# Patient Record
Sex: Male | Born: 1945 | Race: White | Hispanic: No
Health system: Southern US, Academic
[De-identification: ages and names within clinical notes are randomized; demographics above are authoritative.]

## PROBLEM LIST (undated history)

## (undated) ENCOUNTER — Encounter: Attending: Hematology & Oncology | Primary: Hematology & Oncology

## (undated) ENCOUNTER — Ambulatory Visit

## (undated) ENCOUNTER — Encounter

## (undated) ENCOUNTER — Ambulatory Visit: Attending: Urology | Primary: Urology

## (undated) ENCOUNTER — Telehealth: Attending: Hematology & Oncology | Primary: Hematology & Oncology

## (undated) ENCOUNTER — Ambulatory Visit
Attending: Student in an Organized Health Care Education/Training Program | Primary: Student in an Organized Health Care Education/Training Program

## (undated) ENCOUNTER — Telehealth

## (undated) ENCOUNTER — Telehealth: Attending: Children | Primary: Children

## (undated) ENCOUNTER — Ambulatory Visit: Payer: MEDICARE

## (undated) ENCOUNTER — Encounter
Attending: Student in an Organized Health Care Education/Training Program | Primary: Student in an Organized Health Care Education/Training Program

## (undated) ENCOUNTER — Encounter: Attending: Nurse Practitioner | Primary: Nurse Practitioner

## (undated) ENCOUNTER — Encounter
Attending: Pharmacist Clinician (PhC)/ Clinical Pharmacy Specialist | Primary: Pharmacist Clinician (PhC)/ Clinical Pharmacy Specialist

## (undated) ENCOUNTER — Encounter: Attending: Diagnostic Radiology | Primary: Diagnostic Radiology

## (undated) ENCOUNTER — Telehealth
Attending: Pharmacist Clinician (PhC)/ Clinical Pharmacy Specialist | Primary: Pharmacist Clinician (PhC)/ Clinical Pharmacy Specialist

## (undated) DIAGNOSIS — I5020 Unspecified systolic (congestive) heart failure: Secondary | ICD-10-CM

## (undated) DIAGNOSIS — M1990 Unspecified osteoarthritis, unspecified site: Secondary | ICD-10-CM

## (undated) DIAGNOSIS — I5022 Chronic systolic (congestive) heart failure: Secondary | ICD-10-CM

## (undated) DIAGNOSIS — K219 Gastro-esophageal reflux disease without esophagitis: Secondary | ICD-10-CM

## (undated) DIAGNOSIS — C801 Malignant (primary) neoplasm, unspecified: Secondary | ICD-10-CM

## (undated) DIAGNOSIS — R079 Chest pain, unspecified: Secondary | ICD-10-CM

## (undated) DIAGNOSIS — I493 Ventricular premature depolarization: Secondary | ICD-10-CM

## (undated) HISTORY — DX: Unspecified osteoarthritis, unspecified site: M19.90

## (undated) HISTORY — DX: Gastro-esophageal reflux disease without esophagitis: K21.9

## (undated) HISTORY — DX: Ventricular premature depolarization: I49.3

---

## 2004-12-09 ENCOUNTER — Ambulatory Visit: Payer: Self-pay | Admitting: Unknown Physician Specialty

## 2008-02-28 ENCOUNTER — Ambulatory Visit: Payer: Self-pay | Admitting: Unknown Physician Specialty

## 2013-04-23 ENCOUNTER — Ambulatory Visit: Payer: Self-pay | Admitting: Unknown Physician Specialty

## 2013-04-25 LAB — PATHOLOGY REPORT

## 2014-02-19 DIAGNOSIS — Z87898 Personal history of other specified conditions: Secondary | ICD-10-CM | POA: Insufficient documentation

## 2017-03-13 DIAGNOSIS — Z85820 Personal history of malignant melanoma of skin: Secondary | ICD-10-CM | POA: Insufficient documentation

## 2017-03-14 ENCOUNTER — Other Ambulatory Visit: Payer: Self-pay | Admitting: Internal Medicine

## 2017-03-14 DIAGNOSIS — Z0000 Encounter for general adult medical examination without abnormal findings: Secondary | ICD-10-CM

## 2017-03-14 DIAGNOSIS — Z136 Encounter for screening for cardiovascular disorders: Secondary | ICD-10-CM

## 2017-03-27 ENCOUNTER — Ambulatory Visit
Admission: RE | Admit: 2017-03-27 | Discharge: 2017-03-27 | Disposition: A | Discharge status: Home / Self Care | Payer: Medicare Other | Source: Ambulatory Visit | Attending: Internal Medicine | Admitting: Internal Medicine

## 2017-03-27 DIAGNOSIS — Z136 Encounter for screening for cardiovascular disorders: Secondary | ICD-10-CM | POA: Insufficient documentation

## 2017-03-27 DIAGNOSIS — Z0000 Encounter for general adult medical examination without abnormal findings: Secondary | ICD-10-CM

## 2018-04-03 DIAGNOSIS — Z86010 Personal history of colonic polyps: Secondary | ICD-10-CM | POA: Insufficient documentation

## 2018-12-12 ENCOUNTER — Ambulatory Visit: Admit: 2018-12-12 | Discharge: 2018-12-13 | Payer: MEDICARE

## 2018-12-12 DIAGNOSIS — I479 Paroxysmal tachycardia, unspecified: Secondary | ICD-10-CM | POA: Insufficient documentation

## 2018-12-12 DIAGNOSIS — R55 Syncope and collapse: Secondary | ICD-10-CM | POA: Insufficient documentation

## 2018-12-12 DIAGNOSIS — I4729 Other ventricular tachycardia: Secondary | ICD-10-CM | POA: Insufficient documentation

## 2018-12-12 DIAGNOSIS — Z8679 Personal history of other diseases of the circulatory system: Secondary | ICD-10-CM | POA: Insufficient documentation

## 2018-12-13 ENCOUNTER — Encounter: Admit: 2018-12-13 | Discharge: 2018-12-14 | Payer: MEDICARE

## 2018-12-13 DIAGNOSIS — I472 Ventricular tachycardia: Principal | ICD-10-CM

## 2018-12-16 DIAGNOSIS — Z9189 Other specified personal risk factors, not elsewhere classified: Secondary | ICD-10-CM | POA: Insufficient documentation

## 2019-01-17 ENCOUNTER — Ambulatory Visit: Admit: 2019-01-17 | Discharge: 2019-01-30 | Payer: MEDICARE

## 2019-01-17 DIAGNOSIS — I472 Ventricular tachycardia: Principal | ICD-10-CM

## 2019-01-22 ENCOUNTER — Ambulatory Visit: Admit: 2019-01-22 | Discharge: 2019-01-23 | Payer: MEDICARE | Attending: Adult Health | Primary: Adult Health

## 2019-01-22 DIAGNOSIS — I493 Ventricular premature depolarization: Principal | ICD-10-CM

## 2020-03-18 DIAGNOSIS — M180 Bilateral primary osteoarthritis of first carpometacarpal joints: Secondary | ICD-10-CM | POA: Insufficient documentation

## 2020-03-18 DIAGNOSIS — R7989 Other specified abnormal findings of blood chemistry: Secondary | ICD-10-CM | POA: Insufficient documentation

## 2020-03-18 DIAGNOSIS — H903 Sensorineural hearing loss, bilateral: Secondary | ICD-10-CM | POA: Insufficient documentation

## 2021-04-15 DIAGNOSIS — Z720 Tobacco use: Secondary | ICD-10-CM | POA: Insufficient documentation

## 2021-04-26 ENCOUNTER — Ambulatory Visit: Payer: Medicare PPO | Admitting: Urology

## 2021-04-26 ENCOUNTER — Other Ambulatory Visit: Payer: Self-pay

## 2021-04-26 ENCOUNTER — Other Ambulatory Visit
Admission: RE | Admit: 2021-04-26 | Discharge: 2021-04-26 | Disposition: A | Discharge status: Home / Self Care | Payer: Medicare PPO | Attending: Urology | Admitting: Urology

## 2021-04-26 ENCOUNTER — Encounter: Payer: Self-pay | Admitting: Urology

## 2021-04-26 VITALS — BP 144/87 | HR 65 | Ht 69.0 in | Wt 190.6 lb

## 2021-04-26 DIAGNOSIS — R972 Elevated prostate specific antigen [PSA]: Secondary | ICD-10-CM

## 2021-04-26 LAB — PSA: Prostatic Specific Antigen: 45.34 ng/mL — ABNORMAL HIGH (ref 0.00–4.00)

## 2021-04-26 NOTE — Patient Instructions (Addendum)
Transrectal Prostate Biopsy Patient Education and Post Procedure Instructions    -Definition A prostate biopsy is the removal of a small amount of tissue from the prostate gland. The tissue is examined to determine whether there is cancer.  -Reasons for Procedure A prostate biopsy is usually done after an abnormal finding by: Digital rectal exam Prostate specific antigen (PSA) blood test A prostate biopsy is the only way to find out if cancer cells are present.  -Possible Complications Problems from the procedure are rare, but all procedures have some risk including: Infection Bruising or lengthy bleeding from the rectum, or in urine or semen Difficulty urinating Reactions to anesthesia Factors that may increase the risk of complications include: Smoking History of bleeding disorders or easy bruising Use of any medications, over-the-counter medications, or herbal supplements Sensitivity or allergy to latex, medications, or anesthesia.  -Prior to Procedure Talk to your doctor about your medications. Blood thinning medications including aspirin should be stopped 1 week prior to procedure. If prescribed by your cardiologist we may need approval before stopping medications. Use a Fleets enema 2 hours before the procedure. Can be purchased at your pharmacy. Antibiotics will be administered in the clinic prior to procedure.  Please make sure you eat a light meal prior to coming in for your appointment. This can help prevent lightheadedness during the procedure and upset stomach from antibiotics. Please bring someone with you to the procedure to drive you home.  -Anesthesia Transrectal biopsy: Local anesthesia--Just the area that is being operated on is numbed using an injectable anesthetic.  -Description of the Procedure Transrectal biopsy--Your doctor will insert a small ultrasound device into the rectum. This device will produce sound waves to create an image of the prostate.  These images will help guide placement of the needle. Your doctor will then insert the needle through the wall of the rectum and into the prostate gland. The procedure should take approximately 15-30 minutes.  -Will It Hurt? You may have discomfort and soreness at the biopsy site. Pain and discomfort after the procedure can be managed with medications.  -Postoperative Care When you return home after the procedure, do the following to help ensure a smooth recovery: Stay hydrated. Drink plenty of fluids for the next few days. Avoid difficult physical activity the day and evening of the procedure. Keep in mind that you may see blood in your urine, stool, or semen for several days. Resume any medications that were stopped when you are advised to do so.  After the sample is taken, it will be sent to a pathologist for examination under a microscope. This doctor will analyze the sample for cancer. You will be scheduled for an appointment to discuss results. If cancer is present, your doctor will work with you to develop a treatment plan.   -Call Your Doctor or Seek Immediate Medical Attention It is important to monitor your recovery. Alert your doctor to any problems. If any of the following occur, call your doctor or go to the emergency room: Fever 100.5 or greater within 1 week post procedure go directly to ER Call the office for: Blood in the urine more than 1 week or in semen for more than 6 weeks post-biopsy Pain that you cannot control with the medications you have been given Pain, burning, urgency, or frequency of urination Cough, shortness of breath, or chest pain- if severe go to ER Heavy rectal bleeding or bleeding that lasts more than 1 week after the biopsy If you  have any questions or concerns please contact our office at [facility]  [facility] [address] [place], [place] [address] [phone]   Transrectal Ultrasound-Guided Prostate  Biopsy A transrectal ultrasound-guided prostate biopsy is a procedure to remove samples of prostate tissue for testing. The procedure uses ultrasound images to guide the process of removing the samples. The samples are taken to a lab to be checked for prostate cancer. This procedure is usually done to evaluate the prostate gland of men who have raised (elevated) levels of prostate-specific antigen (PSA), which can be a sign of prostate cancer. Tell a health care provider about: Any allergies you have. All medicines you are taking, including vitamins, herbs, eye drops, creams, and over-the-counter medicines. Any problems you or family members have had with anesthetic medicines. Any blood disorders you have. Any surgeries you have had. Any medical conditions you have. What are the risks? Generally, this is a safe procedure. However, problems may occur, including: Prostate infection. Bleeding from the rectum. Blood in the urine. Allergic reactions to medicines. Damage to surrounding structures such as blood vessels, organs, or muscles. Difficulty passing urine. Nerve damage. This is usually temporary. Pain. What happens before the procedure? Eating and drinking restrictions Follow instructions from your health care provider about eating and drinking. In most instances, you will not need to stop eating and drinking completely before the procedure. Medicines Ask your health care provider about: Changing or stopping your regular medicines. This is especially important if you are taking diabetes medicines or blood thinners. Taking medicines such as aspirin and ibuprofen. These medicines can thin your blood. Do not take these medicines unless your health care provider tells you to take them. Taking over-the-counter medicines, vitamins, herbs, and supplements. General instructions You will be given an enema. During an enema, a liquid is injected into your rectum to clear out waste. You may have  a blood or urine sample taken. Plan to have a responsible adult take you home from the hospital or clinic. If you will be going home right after the procedure, plan to have a responsible adult care for you for the time you are told. This is important. Ask your health care provider what steps will be taken to help prevent infection. These steps may include: Washing skin with a germ-killing soap. Taking antibiotic medicine. What happens during the procedure?  You will be given one or both of the following: A medicine to help you relax (sedative). A medicine to numb the area (local anesthetic). You will be placed on your left side, and your knees may be bent. A probe with lubricated gel will be placed into your rectum, and images will be taken of your prostate and surrounding structures. Numbing medicine will be injected into your prostate. A biopsy needle will be inserted through your rectum and guided to your prostate using the ultrasound images. Prostate tissue samples will be removed, and the needle will then be removed. The biopsy samples will be sent to a lab to be tested. The procedure may vary among health care providers and hospitals. What happens after the procedure? Your blood pressure, heart rate, breathing rate, and blood oxygen level will be monitored until the medicines you were given have worn off. You may have some discomfort in the rectal area. You will be given pain medicine as needed. Do not drive for 24 hours if you received a sedative. Summary A transrectal ultrasound-guided biopsy removes samples of tissue from your prostate. This procedure is  usually done to evaluate the prostate gland of men who have raised (elevated) levels of prostate-specific antigen (PSA), which can be a sign of prostate cancer. After your procedure, you may feel some discomfort in the rectal area. Plan to have a responsible adult take you home from the hospital or clinic. This information is not  intended to replace advice given to you by your health care provider. Make sure you discuss any questions you have with your health care provider. Document Revised: 04/02/2020 Document Reviewed: 03/04/2020 Elsevier Patient Education  2022 [place].  Prostate Cancer Screening Prostate cancer screening is a test that is done to check for the presence of prostate cancer in men. The prostate gland is a walnut-sized gland that is located below the bladder and in front of the rectum in males. The function of the prostate is to add fluid to semen during ejaculation. Prostate cancer is the second most common type of cancer in men. Who should have prostate cancer screening? Screening recommendations vary based on age and other risk factors. Screening is recommended if: You are older than age 74. If you are age 40-69, talk with your health care provider about your need for screening and how often screening should be done. Because most prostate cancers are slow growing and will not cause death, screening is generally reserved in this age group for men who have a 10-15-year life expectancy. You are younger than age 50, and you have these risk factors: Being a [place] male or a male of African descent. Having a father, brother, or uncle who has been diagnosed with prostate cancer. The risk is higher if your family member's cancer occurred at an early age. Screening is not recommended if: You are younger than age 28. You are between the ages of 37 and 66 and you have no risk factors. You are 15 years of age or older. At this age, the risks that screening can cause are greater than the benefits that it may provide. If you are at high risk for prostate cancer, your health care provider may recommend that you have screenings more often or that you start screening at a younger age. How is screening for prostate cancer done? The recommended prostate cancer screening test is a blood test called the  prostate-specific antigen (PSA) test. PSA is a protein that is made in the prostate. As you age, your prostate naturally produces more PSA. Abnormally high PSA levels may be caused by: Prostate cancer. An enlarged prostate that is not caused by cancer (benign prostatic hyperplasia, BPH). This condition is very common in older men. A prostate gland infection (prostatitis). Depending on the PSA results, you may need more tests, such as: A physical exam to check the size of your prostate gland. Blood and imaging tests. A procedure to remove tissue samples from your prostate gland for testing (biopsy). What are the benefits of prostate cancer screening? Screening can help to identify cancer at an early stage, before symptoms start and when the cancer can be treated more easily. There is a small chance that screening may lower your risk of dying from prostate cancer. The chance is small because prostate cancer is a slow-growing cancer, and most men with prostate cancer die from a different cause. What are the risks of prostate cancer screening? The main risk of prostate cancer screening is diagnosing and treating prostate cancer that would never have caused any symptoms or problems. This is called overdiagnosisand overtreatment. PSA screening cannot tell you if  your PSA is high due to cancer or a different cause. A prostate biopsy is the only procedure to diagnose prostate cancer. Even the results of a biopsy may not tell you if your cancer needs to be treated. Slow-growing prostate cancer may not need any treatment other than monitoring, so diagnosing and treating it may cause unnecessary stress or other side effects. Questions to ask your health care provider When should I start prostate cancer screening? What is my risk for prostate cancer? How often do I need screening? What type of screening tests do I need? How do I get my test results? What do my results mean? Do I need treatment? Where to  find more information The American Cancer Society: www.cancer.org American Urological Association: www.auanet.org Contact a health care provider if: You have difficulty urinating. You have pain when you urinate or ejaculate. You have blood in your urine or semen. You have pain in your back or in the area of your prostate. Summary Prostate cancer is a common type of cancer in men. The prostate gland is located below the bladder and in front of the rectum. This gland adds fluid to semen during ejaculation. Prostate cancer screening may identify cancer at an early stage, when the cancer can be treated more easily. The prostate-specific antigen (PSA) test is the recommended screening test for prostate cancer. Discuss the risks and benefits of prostate cancer screening with your health care provider. If you are age 45 or older, the risks that screening can cause are greater than the benefits that it may provide. This information is not intended to replace advice given to you by your health care provider. Make sure you discuss any questions you have with your health care provider. Document Revised: 08/20/2020 Document Reviewed: 01/30/2019 Elsevier Patient Education  [place].

## 2021-04-26 NOTE — Progress Notes (Signed)
   04/26/21 9:24 AM   Earlean Polka. 02/15/1946 [id number]  CC: Elevated PSA(88)  HPI: 75 year old male presents with an elevated PSA of 49.  No prior PSA values to review.  Urinalysis checked at the same time was benign.  He reportedly had an elevated PSA of ~4 10 years ago and a prostate biopsy at that time was negative, but those records are unavailable to me.  He denies any urinary symptoms or gross hematuria.  He reports some " tingling" behind his scrotum over the last few months.  He denies a family history of prostate or breast cancer.  He is otherwise very healthy.  His wife passed away in October 06, 2017.  Physical Exam: BP (!) 144/87 (BP Location: Left Arm, Patient Position: Sitting, Cuff Size: Normal)   Pulse 65   Ht 5\' 9"  (1.753 m)   Wt 190 lb 9.6 oz (86.5 kg)   BMI 28.15 kg/m    Constitutional:  Alert and oriented, No acute distress. Cardiovascular: No clubbing, cyanosis, or edema. Respiratory: Normal respiratory effort, no increased work of breathing. GI: Abdomen is soft, nontender, nondistended, no abdominal masses DRE: 50 g, irregular and firm at left base  Laboratory Data: Reviewed, see HPI  Pertinent Imaging: None to review  Assessment & Plan:   75 year old male with elevated PSA of 49 and abnormal DRE suspicious for prostate cancer.  We reviewed the implications of an elevated PSA and the uncertainty surrounding it. In general, a man's PSA increases with age and is produced by both normal and cancerous prostate tissue. The differential diagnosis for elevated PSA includes BPH, prostate cancer, infection, recent intercourse/ejaculation, recent urethroscopic manipulation (foley placement/cystoscopy) or trauma, and prostatitis.   Management of an elevated PSA can include observation or prostate biopsy and we discussed this in detail. Our goal is to detect clinically significant prostate cancers, and manage with either active surveillance, surgery, or radiation for  localized disease. Risks of prostate biopsy include bleeding, infection (including life threatening sepsis), pain, and lower urinary symptoms. Hematuria, hematospermia, and blood in the stool are all common after biopsy and can persist up to 4 weeks.   -Repeat PSA today -Schedule prostate biopsy next week-we discussed the possible need for further staging imaging pending biopsy results  Nickolas Madrid, MD 04/26/2021  [facility] [address] [place], [place] [address] [phone]

## 2021-05-04 ENCOUNTER — Other Ambulatory Visit: Payer: Self-pay

## 2021-05-04 ENCOUNTER — Encounter: Payer: Self-pay | Admitting: Urology

## 2021-05-04 ENCOUNTER — Ambulatory Visit: Payer: Medicare PPO | Admitting: Urology

## 2021-05-04 VITALS — BP 147/87 | HR 79 | Ht 69.0 in | Wt 190.0 lb

## 2021-05-04 DIAGNOSIS — R972 Elevated prostate specific antigen [PSA]: Secondary | ICD-10-CM

## 2021-05-04 DIAGNOSIS — C61 Malignant neoplasm of prostate: Secondary | ICD-10-CM

## 2021-05-04 MED ORDER — LEVOFLOXACIN 500 MG PO TABS
500.0000 mg | ORAL_TABLET | Freq: Once | ORAL | Status: AC
Start: 2021-05-04 — End: 2021-05-04
  Administered 2021-05-04: 500 mg via ORAL

## 2021-05-04 MED ORDER — GENTAMICIN SULFATE 40 MG/ML IJ SOLN
80.0000 mg | Freq: Once | INTRAMUSCULAR | Status: AC
  Administered 2021-05-04: 80 mg via INTRAMUSCULAR

## 2021-05-04 NOTE — Addendum Note (Signed)
Addended by: Donalee Citrin on: 05/04/2021 11:54 AM   Modules accepted: Orders

## 2021-05-04 NOTE — Progress Notes (Signed)
   05/04/21  Indication: Elevated PSA, 49  Prostate Biopsy Procedure   Informed consent was obtained, and we discussed the risks of bleeding and infection/sepsis. A time out was performed to ensure correct patient identity.  Pre-Procedure: - Last PSA Level: 49 - Gentamicin and levaquin given for antibiotic prophylaxis - Transrectal Ultrasound performed revealing a 43 gm prostate - No significant hypoechoic or median lobe noted  Procedure: - Prostate block performed using 10 cc 1% lidocaine and biopsies taken from sextant areas, a total of 12 under ultrasound guidance.  Post-Procedure: - Patient tolerated the procedure well - He was counseled to seek immediate medical attention if experiences significant bleeding, fevers, or severe pain - Return in one week to discuss biopsy results  Assessment/ Plan: Will follow up in 1-2 weeks to discuss pathology  Nickolas Madrid, MD 05/04/2021

## 2021-05-04 NOTE — Patient Instructions (Signed)

## 2021-05-05 LAB — SURGICAL PATHOLOGY

## 2021-05-11 ENCOUNTER — Encounter: Payer: Self-pay | Admitting: Urology

## 2021-05-11 ENCOUNTER — Other Ambulatory Visit: Payer: Self-pay

## 2021-05-11 ENCOUNTER — Ambulatory Visit (INDEPENDENT_AMBULATORY_CARE_PROVIDER_SITE_OTHER): Payer: Medicare PPO | Admitting: Urology

## 2021-05-11 VITALS — BP 138/82 | HR 64 | Ht 69.0 in | Wt 185.0 lb

## 2021-05-11 DIAGNOSIS — C61 Malignant neoplasm of prostate: Secondary | ICD-10-CM | POA: Diagnosis not present

## 2021-05-11 NOTE — Progress Notes (Signed)
   05/11/2021 12:38 PM   Juan Huynh. 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  We had a lengthy conversation today about the patient's new diagnosis of prostate cancer.  We reviewed the risk classifications per the AUA guidelines including very low risk, low risk, intermediate risk, and high risk disease, and the need for additional staging imaging with CT and bone scan in patients with unfavorable intermediate risk and high risk disease.  I explained that his life expectancy, clinical stage, Gleason score, PSA, and other Huynh-morbidities influence treatment strategies.  We discussed the roles of active surveillance, radiation therapy, surgical therapy with robotic prostatectomy, and hormone therapy with androgen deprivation.  We discussed that patients urinary symptoms also impact treatment strategy, as patients with severe lower urinary tract symptoms may have significant worsening or even develop urinary retention after undergoing radiation.  In regards to surgery, we discussed robotic prostatectomy +/- lymphadenectomy at length.  The procedure takes 3 to 4 hours, and patient's typically discharge home on post-op day #1.  A Foley catheter is left in place for 7 to 10 days to allow for healing of the vesicourethral anastomosis.  There is a small risk of bleeding, infection, damage to surrounding structures or bowel, hernia, DVT/PE, or serious cardiac or pulmonary complications.  We discussed at length post-op side effects including erectile dysfunction, and the importance of pre-operative erectile function on long-term outcomes.  Even with  a nerve sparing approach, there is an approximately 25% rate of permanent erectile dysfunction.  We also discussed postop urinary incontinence at length.  We expect patients to have stress incontinence post-operatively that will improve over period of weeks to months.  Less than 10% of men will require a pad at 1 year after surgery.  Patients will need to avoid heavy lifting and strenuous activity for 3 to 4 weeks, but most men return to their baseline activity status by 6 weeks.  -Staging imaging with CT abdomen and pelvis and bone scan ordered, will call with results -He also desires a second opinion, and we will help facilitate this via the [place] in [place] or [facility] for his preference   I spent 30 total minutes on the day of the encounter including pre-visit review of the medical record, face-to-face time with the patient, and post visit ordering of labs/imaging/tests.   Juan Huynh, [facility] [address], [place], [place] [address] [phone]

## 2021-05-11 NOTE — Patient Instructions (Signed)
Prostate Cancer The prostate is a small gland that produces fluid that makes up semen (seminal fluid). It is located below the bladder in men, in front of the rectum. Prostate cancer is the abnormal growth of cells in the prostate gland. What are the causes? The exact cause of this condition is not known. What increases the risk? You are more likely to develop this condition if: You are 74 years of age or older. You have a family history of prostate cancer. You have a family history of breast and ovarian cancer. You have genes that are passed from parent to child (inherited), such as BRCA1 and BRCA2. You have Lynch syndrome. African American men and men of African descent are diagnosed with prostate cancer at higher rates than other men. The reasons for this are not well understood and are likely due to a combination of genetic and environmental factors. What are the signs or symptoms? Symptoms of this condition include: Problems with urination. This may include: A weak or interrupted flow of urine. Trouble starting or stopping urination. Trouble emptying the bladder all the way. The need to urinate more often, especially at night. Blood in urine or semen. Persistent pain or discomfort in the lower back, lower abdomen, or hips. Trouble getting an erection. Weakness or numbness in the legs or feet. How is this diagnosed? This condition can be diagnosed with: A digital rectal exam. For this exam, a health care provider inserts a gloved finger into the rectum to feel the prostate gland. A blood test called a prostate-specific antigen (PSA) test. A procedure in which a sample of tissue is taken from the prostate and checked under a microscope (prostate biopsy). An imaging test called transrectal ultrasonography. Once the condition is diagnosed, tests will be done to determine how far the cancer has spread. This is called staging the cancer. Staging may involve imaging tests, such as a bone  scan, CT scan, PET scan, or MRI. Stages of prostate cancer The stages of prostate cancer are as follows: Stage 1 (I). At this stage, the cancer is found in the prostate only. The cancer is not visible on imaging tests, and it is usually found by accident, such as during prostate surgery. Stage 2 (II). At this stage, the cancer is more advanced than it is in stage 1, but the cancer has not spread outside the prostate. Stage 3 (III). At this stage, the cancer has spread beyond the outer layer of the prostate to nearby tissues. The cancer may be found in the seminal vesicles, which are near the bladder and the prostate. Stage 4 (IV). At this stage, the cancer has spread to other parts of the body, such as the lymph nodes, bones, bladder, rectum, liver, or lungs. Prostate cancer grading Prostate cancer is also graded according to how the cancer cells look under a microscope. This is called the Gleason score and the total score can range from 6-10, indicating how likely it is that the cancer will spread (metastasize) to other parts of the body. The higher the score, the greater the likelihood that the cancer will spread. Gleason 6 or lower: This indicates that the cancer cells look similar to normal prostate cells (well differentiated). Gleason 7: This indicates that the cancer cells look somewhat similar to normal prostate cells (moderately differentiated). Gleason 8, 9, or 10: This indicates that the cancer cells look very different than normal prostate cells (poorly differentiated). How is this treated? Treatment for this condition depends on several factors,  including the stage of the cancer, your age, personal preferences, and your overall health. Talk with your health care provider about treatment options that are recommended for you. Common treatments include: Observation for early stage prostate cancer (active surveillance). This involves having exams, blood tests, and in some cases, more biopsies.  For some men, this is the only treatment needed. Surgery. Types of surgeries include: Open surgery (radical prostatectomy). In this surgery, a larger incision is made to remove the prostate. A laparoscopic radical prostatectomy. This is a surgery to remove the prostate and lymph nodes through several small incisions. It is often referred to as a minimally invasive surgery. A robotic radical prostatectomy. This is laparoscopic surgery to remove the prostate and lymph nodes with the help of robotic arms that are controlled by the surgeon. Cryoablation. This is surgery to freeze and destroy cancer cells. Radiation treatment. Types of radiation treatment include: External beam radiation. This type aims beams of radiation from outside the body at the prostate to destroy cancerous cells. Brachytherapy. This type uses radioactive needles, seeds, wires, or tubes that are implanted into the prostate gland. Like external beam radiation, brachytherapy destroys cancerous cells. An advantage is that this type of radiation limits the damage to surrounding tissue and has fewer side effects. Chemotherapy. This treatment kills cancer cells or stops them from multiplying. It kills both cancer cells and normal cells. Targeted therapy. This treatment uses medicines to kill cancer cells without damaging normal cells. Hormone treatment. This treatment involves taking medicines that act on testosterone, one of the male hormones, by: Stopping your body from producing testosterone. Blocking testosterone from reaching cancer cells. Follow these instructions at home: Lifestyle Do not use any products that contain nicotine or tobacco. These products include cigarettes, chewing tobacco, and vaping devices, such as e-cigarettes. If you need help quitting, ask your health care provider. Eat a healthy diet. To do this: Eat foods that are high in fiber. These include beans, whole grains, and fresh fruits and vegetables. Limit  foods that are high in fat and sugar. These include fried or sweet foods. Treatment for prostate cancer may affect sexual function. If you have a partner, continue to have intimate moments. This may include touching, holding, hugging, and caressing your partner. Get plenty of sleep. Consider joining a support group for men who have prostate cancer. Meeting with a support group may help you learn to manage the stress of having cancer. General instructions Take over-the-counter and prescription medicines only as told by your health care provider. If you have to go to the hospital, notify your cancer specialist (oncologist). Keep all follow-up visits. This is important. Where to find more information American Cancer Society: www.cancer.org American Society of Clinical Oncology: www.cancer.net National Cancer Institute: www.cancer.gov Contact a health care provider if: You have new or increasing trouble urinating. You have new or increasing blood in your urine. You have new or increasing pain in your hips, back, or chest. Get help right away if: You have weakness or numbness in your legs. You cannot control urination or your bowel movements (incontinence). You have chills or a fever. Summary The prostate is a small gland that is involved in the production of semen. It is located below a man's bladder, in front of the rectum. Prostate cancer is the abnormal growth of cells in the prostate gland. Treatment for this condition depends on the stage of the cancer, your age, personal preferences, and your overall health. Talk with your health care provider about treatment   options that are recommended for you. Consider joining a support group for men who have prostate cancer. Meeting with a support group may help you learn to manage the stress of having cancer. This information is not intended to replace advice given to you by your health care provider. Make sure you discuss any questions you have with  your health care provider. Document Revised: 09/15/2020 Document Reviewed: 09/15/2020 Elsevier Patient Education  2022 Elsevier Inc.  

## 2021-05-19 ENCOUNTER — Other Ambulatory Visit: Payer: Self-pay

## 2021-05-19 ENCOUNTER — Encounter
Admission: RE | Admit: 2021-05-19 | Discharge: 2021-05-19 | Disposition: A | Discharge status: Home / Self Care | Payer: Medicare PPO | Source: Ambulatory Visit | Attending: Urology | Admitting: Urology

## 2021-05-19 ENCOUNTER — Ambulatory Visit
Admission: RE | Admit: 2021-05-19 | Discharge: 2021-05-19 | Disposition: A | Discharge status: Home / Self Care | Payer: Medicare PPO | Source: Ambulatory Visit | Attending: Urology | Admitting: Urology

## 2021-05-19 DIAGNOSIS — C61 Malignant neoplasm of prostate: Secondary | ICD-10-CM | POA: Insufficient documentation

## 2021-05-19 LAB — POCT I-STAT CREATININE: Creatinine, Ser: 1.2 mg/dL (ref 0.61–1.24)

## 2021-05-19 MED ORDER — IOHEXOL 300 MG/ML  SOLN
100.0000 mL | Freq: Once | INTRAMUSCULAR | Status: AC | PRN
  Administered 2021-05-19: 12:00:00 100 mL via INTRAVENOUS

## 2021-05-19 MED ORDER — TECHNETIUM TC 99M MEDRONATE IV KIT
20.0000 | PACK | Freq: Once | INTRAVENOUS | Status: AC | PRN
  Administered 2021-05-19: 12:00:00 24 via INTRAVENOUS

## 2021-05-20 ENCOUNTER — Telehealth: Payer: Self-pay | Admitting: Urology

## 2021-05-20 DIAGNOSIS — C61 Malignant neoplasm of prostate: Secondary | ICD-10-CM

## 2021-05-20 NOTE — Telephone Encounter (Signed)
Urology telephone note  75 year old healthy male with elevated PSA of 49 who underwent a prostate biopsy on 05/04/2021 showing high risk prostate cancer including Gleason score 4+5= 9 with 100% core involvement.  Staging imaging with CT and bone scan showed a enlarged left pelvic node, periaortic node consistent with metastatic disease, as well as a scapular lesion on bone scan that may represent prior injury versus metastatic disease.  We discussed his new diagnosis of metastatic prostate cancer and need for referral to oncology.  We discussed different treatment options at length, and that ADT is usually the mainstay of metastatic prostate cancer.  Referral placed to oncology, he is interested in a second oncology opinion from [place] as well   Nickolas Madrid, MD 05/20/2021

## 2021-05-23 ENCOUNTER — Ambulatory Visit: Admit: 2021-05-23 | Discharge: 2021-05-24 | Payer: MEDICARE

## 2021-05-23 DIAGNOSIS — C61 Malignant neoplasm of prostate: Principal | ICD-10-CM

## 2021-05-25 NOTE — Telephone Encounter (Signed)
Patient sent a My Chart message asking to change his referral to [facility] instead. He has an appt there on 05/30/21 and 05/31/21.  Sharyn Lull

## 2021-05-30 ENCOUNTER — Ambulatory Visit
Admit: 2021-05-30 | Discharge: 2021-05-30 | Payer: MEDICARE | Attending: Student in an Organized Health Care Education/Training Program | Primary: Student in an Organized Health Care Education/Training Program

## 2021-05-30 ENCOUNTER — Ambulatory Visit: Admit: 2021-05-30 | Discharge: 2021-05-30 | Payer: MEDICARE

## 2021-05-30 DIAGNOSIS — C778 Secondary and unspecified malignant neoplasm of lymph nodes of multiple regions: Secondary | ICD-10-CM | POA: Insufficient documentation

## 2021-05-30 DIAGNOSIS — C61 Malignant neoplasm of prostate: Principal | ICD-10-CM

## 2021-05-31 ENCOUNTER — Encounter: Admit: 2021-05-31 | Discharge: 2021-05-31 | Payer: MEDICARE

## 2021-05-31 ENCOUNTER — Ambulatory Visit
Admit: 2021-05-31 | Discharge: 2021-06-01 | Payer: MEDICARE | Attending: Hematology & Oncology | Primary: Hematology & Oncology

## 2021-05-31 DIAGNOSIS — C61 Malignant neoplasm of prostate: Principal | ICD-10-CM

## 2021-05-31 DIAGNOSIS — C778 Secondary and unspecified malignant neoplasm of lymph nodes of multiple regions: Principal | ICD-10-CM

## 2021-06-01 DIAGNOSIS — Z8262 Family history of osteoporosis: Secondary | ICD-10-CM | POA: Insufficient documentation

## 2021-06-02 ENCOUNTER — Ambulatory Visit: Admit: 2021-06-02 | Payer: MEDICARE

## 2021-06-06 DIAGNOSIS — C61 Malignant neoplasm of prostate: Principal | ICD-10-CM

## 2021-06-07 DIAGNOSIS — C61 Malignant neoplasm of prostate: Principal | ICD-10-CM

## 2021-06-13 ENCOUNTER — Ambulatory Visit: Admit: 2021-06-13 | Discharge: 2021-06-14 | Payer: MEDICARE

## 2021-06-15 DIAGNOSIS — C61 Malignant neoplasm of prostate: Principal | ICD-10-CM

## 2021-06-16 ENCOUNTER — Ambulatory Visit
Admit: 2021-06-16 | Discharge: 2021-06-17 | Payer: MEDICARE | Attending: Hematology & Oncology | Primary: Hematology & Oncology

## 2021-06-16 ENCOUNTER — Institutional Professional Consult (permissible substitution): Admit: 2021-06-16 | Discharge: 2021-06-17 | Payer: MEDICARE

## 2021-06-16 DIAGNOSIS — C7951 Secondary malignant neoplasm of bone: Secondary | ICD-10-CM | POA: Insufficient documentation

## 2021-06-16 MED ORDER — BICALUTAMIDE 50 MG TABLET
ORAL_TABLET | Freq: Every day | ORAL | 0 refills | 7 days | Status: CP
Start: 2021-06-16 — End: ?

## 2021-07-05 ENCOUNTER — Ambulatory Visit: Admit: 2021-07-05 | Payer: MEDICARE

## 2021-07-26 ENCOUNTER — Ambulatory Visit
Admit: 2021-07-26 | Discharge: 2021-07-27 | Payer: MEDICARE | Attending: Hematology & Oncology | Primary: Hematology & Oncology

## 2021-07-26 ENCOUNTER — Other Ambulatory Visit: Admit: 2021-07-26 | Discharge: 2021-07-27 | Payer: MEDICARE

## 2021-07-26 DIAGNOSIS — C61 Malignant neoplasm of prostate: Principal | ICD-10-CM

## 2021-07-26 DIAGNOSIS — C7951 Secondary malignant neoplasm of bone: Principal | ICD-10-CM

## 2021-07-26 DIAGNOSIS — C778 Secondary and unspecified malignant neoplasm of lymph nodes of multiple regions: Principal | ICD-10-CM

## 2021-07-26 MED ORDER — ABIRATERONE 250 MG TABLET
ORAL_TABLET | Freq: Every day | ORAL | 11 refills | 30 days | Status: CP
Start: 2021-07-26 — End: ?
  Filled 2021-08-11: qty 120, 30d supply, fill #0

## 2021-07-26 MED ORDER — PREDNISONE 5 MG TABLET
ORAL_TABLET | Freq: Every day | ORAL | 11 refills | 30 days | Status: CP
Start: 2021-07-26 — End: ?
  Filled 2021-08-11: qty 30, 30d supply, fill #0

## 2021-07-27 DIAGNOSIS — C61 Malignant neoplasm of prostate: Principal | ICD-10-CM

## 2021-07-29 DIAGNOSIS — C61 Malignant neoplasm of prostate: Principal | ICD-10-CM

## 2021-07-29 DIAGNOSIS — C778 Secondary and unspecified malignant neoplasm of lymph nodes of multiple regions: Principal | ICD-10-CM

## 2021-07-29 DIAGNOSIS — C7951 Secondary malignant neoplasm of bone: Principal | ICD-10-CM

## 2021-08-03 ENCOUNTER — Ambulatory Visit
Admit: 2021-08-03 | Discharge: 2021-08-23 | Payer: MEDICARE | Attending: Student in an Organized Health Care Education/Training Program | Primary: Student in an Organized Health Care Education/Training Program

## 2021-08-04 ENCOUNTER — Ambulatory Visit: Admit: 2021-08-04 | Discharge: 2021-08-05 | Payer: MEDICARE

## 2021-08-10 NOTE — Unmapped (Signed)
[facility] Specialty Medication Onboarding    Specialty Medication: Zytiga 250mg  tablet  Prior Authorization: Approved   Financial Assistance: No - copay card or gant not available   Final Copay/Day Supply: $100 / 30 days    Insurance Restrictions: None      Notes to Pharmacist: Per MAPs: After speaking with the patient, he has stated he is able to afford the co-pay and is not in need of assistance at this time. I've informed him that if anything changes, he can contact myself or his provider team for further pursuit of financial assistance. Closing referral at this time.      The triage team has completed the benefits investigation and has determined that the patient is able to fill this medication at [facility]. Please contact the patient to complete the onboarding or follow up with the prescribing physician as needed.

## 2021-08-11 NOTE — Unmapped (Signed)
[facility]   Patient Onboarding/Medication Counseling    Eric Good is a 76 y.o. male with prostate cancer who I am counseling today on initiation of therapy.  I am speaking to the patient.    Was a Nurse, learning disability used for this call? No    Verified patient's date of birth / HIPAA.    Specialty medication(s) to be sent: Hematology/Oncology: abiraterone 250mg     Non-specialty medications/supplies to be sent: prednisone 5 mg    Medications not needed at this time: none     Zytiga (Abiraterone)    Medication & Administration     Dosage: Take 4 tablets (1000mg ) by mouth once daily.     Administration:   ??? Take on an empty stomach (at least 1 hour before or 2 hours after food)  ??? Take with prednisone  ??? Take with calcium and vitamin D    Adherence/Missed dose instructions:  ??? If miss a dose, wait until next day to take normal dose    Goals of Therapy     ??? Prevent disease progression    Side Effects & Monitoring Parameters     ??? Common side effects  ??? Flushing/hot flashes  ??? Signs of high blood pressure  (bad headache, dizziness/passing out, or change in eyesight)  ??? Muscle pain  ??? Joint pain or swelling  ??? Heartburn  ??? Cough, stuffy nose, sore throat  ??? Upset stomach, nausea or vomiting  ??? Diarrhea or constipation  ??? Feeling weak or tired  ??? Trouble sleeping    ??? The following side effects should be reported to the provider:  ??? Allergic reaction  ??? Electrolyte problems (mood changes, confusion, muscle pain, abnormal heartbeat, seizures)  ??? Signs of weak adrenal gland (bad upset stomach or throwing up, very bad dizziness or passing out, muscle weakness, feeling very tired, mood changes, not hungry, or weight loss)  ??? Signs of UTI  ??? Signs of high blood sugar (confusion, sleepiness, increased thirst, hunger, and urination, fruity breath)  ??? Feeling weak or tired  ??? Bruising  ??? Bone pain  ??? Swelling, weight gain, or trouble breathing  ??? Liver problems (dark urine, not hungry, upset stomach, light colored stool, yellow skin or eyes    Monitoring Parameters  ??? LFT monitoring every 2 weeks for 3 months and monthly thereafter  ??? Serum potassium levels frequently during treatment  ??? Monitor blood pressure    Contraindications, Warnings, & Precautions     ??? Contraindicated in women who are or may become pregnant  ??? Adrenocortical insufficiency  ??? Hepatotoxicity  ??? Mineralocorticoid excess - monitor for hypertension, hypokalemia, and fluid retention. Take with a corticosteroid.  ??? Cardiovascular disease - control hypertension and correct hypokalemia prior to and during treatment.    Drug/Food Interactions     ??? Medication list reviewed in Epic. The patient was instructed to inform the care team before taking any new medications or supplements. No drug interactions identified.   ??? List any food/vaccine restrictions here.     Storage, Handling Precautions, & Disposal     ??? Store at room temperature  ??? This drug is considered hazardous and should be handled as little as possible.  If someone else helps with medication administration, they should wear gloves.    Current Medications (including OTC/herbals), Comorbidities and Allergies     Current Outpatient Medications   Medication Sig Dispense Refill   ??? abiraterone (ZYTIGA) 250 mg Tab tablet Take 4 tablets (1,000 mg  total) by mouth daily. 120 tablet 11   ??? fish oil-omega-3 fatty acids 300-1,000 mg capsule Take 2 g by mouth daily.     ??? melatonin 3 mg Tab Take 3 mg by mouth nightly as needed.     ??? multivitamin (TAB-A-VITE/THERAGRAN) per tablet Take 1 tablet by mouth daily.     ??? naphazoline-pheniramine (NAPHCON-A) 0.025-0.3 % ophthalmic solution Apply to eye every hour as needed.     ??? predniSONE (DELTASONE) 5 MG tablet Take 1 tablet (5 mg total) by mouth daily. 30 tablet 11   ??? vitamin B complex vit C no.3 (VITAMIN B COMP AND C NO.3 ORAL) Take by mouth daily as needed.      ??? zinc gluconate 50 mg (7 mg elemental zinc) tablet Take 50 mg by mouth daily.       No current facility-administered medications for this visit.       Allergies   Allergen Reactions   ??? Flonase [Fluticasone]      Nose bleed   ??? Budesonide      Other reaction(s): Other (See Comments)  Epistaxis       Patient Active Problem List   Diagnosis   ??? Non-sustained ventricular tachycardia   ??? Pre-syncope   ??? PVC (premature ventricular contraction)   ??? Prostate cancer (CMS-HCC)   ??? 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 Family history of osteoporosis in father   ??? 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    Appropriateness of Therapy     Acute infections noted within Epic:  No active infections  Patient reported infection: None    Is medication and dose appropriate based on diagnosis and infection status? Yes    Prescription has been clinically reviewed: Yes      Baseline Quality of Life Assessment      How many days over the past month did your prostate cancer  keep you from your normal activities? For example, brushing your teeth or getting up in the morning. 0    Financial Information     Medication Assistance provided: Prior Authorization    Anticipated copay of $100 / 30 days reviewed with patient. Verified delivery address.    Delivery Information     Scheduled delivery date: 08/12/21    Expected start date: ASAP    Medication will be delivered via Next Day Courier to the prescription address in [facility].  This shipment will not require a signature.      Explained the services we provide at [facility] and that each month we would call to set up refills.  Stressed importance of returning phone calls so that we could ensure they receive their medications in time each month.  Informed patient that we should be setting up refills 7-10 days prior to when they will run out of medication.  A pharmacist will reach out to perform a clinical assessment periodically.  Informed patient that a welcome packet, containing information about our pharmacy and other support services, a Notice of Privacy Practices, and a drug information handout will be sent.      The patient or caregiver noted above participated in the development of this care plan and knows that they can request review of or adjustments to the care plan at any time.      Patient or caregiver verbalized understanding of the above information as well  as how to contact the pharmacy at [phone] option 4 with any questions/concerns.  The pharmacy is open Monday through Friday 8:30am-4:30pm.  A pharmacist is available 24/7 via pager to answer any clinical questions they may have.    Patient Specific Needs     - Does the patient have any physical, cognitive, or cultural barriers? No    - Does the patient have adequate living arrangements? (i.e. the ability to store and take their medication appropriately) Yes    - Did you identify any home environmental safety or security hazards? No    - Patient prefers to have medications discussed with  Patient     - Is the patient or caregiver able to read and understand education materials at a high school level or above? Yes    - Patient's primary language is  English     - Is the patient high risk? Yes, patient is taking oral chemotherapy. Appropriateness of therapy as been assessed    SOCIAL DETERMINANTS OF HEALTH     At the [facility], we have learned that life circumstances - like trouble affording food, housing, utilities, or transportation can affect the health of many of our patients.   That is why we wanted to ask: are you currently experiencing any life circumstances that are negatively impacting your health and/or quality of life? No    Social Determinants of Health     Food Insecurity: Not on file   Tobacco Use: High Risk   ??? Smoking Tobacco Use: Some Days   ??? Smokeless Tobacco Use: Never   ??? Passive Exposure: Not on file   Transportation Needs: Not on file   Alcohol Use: Not on file   Housing/Utilities: Not on file   Substance Use: Not on file   Financial Resource Strain: Not on file   Physical Activity: Not on file   Health Literacy: Not on file   Stress: Not on file   Intimate Partner Violence: Not on file   Depression: Not on file   Social Connections: Not on file       Would you be willing to receive help with any of the needs that you have identified today? Not applicable       Jamario Colina [facility] Specialty Pharmacist

## 2021-08-17 ENCOUNTER — Encounter: Payer: Self-pay | Admitting: Oncology

## 2021-08-17 NOTE — Unmapped (Signed)
Called the patient back and he said he was returning Mauricia Area phone call. He said she was probably wanting to get him scheduled for lab work.  He wanted her to know that he received the medications  Zytiga and Prednisone and has been on them for 5 days now as instructed.      I will let Mauricia Area know that he is trying to reach her.

## 2021-08-17 NOTE — Unmapped (Addendum)
Called 2 times and left message. Will attempt later today.

## 2021-08-17 NOTE — Unmapped (Signed)
Hi,     Krista contacted the [employer] regarding the following:    - States that he is returning a call    Please contact Arshad at [phone].    Thanks in advance,    Rosary Lively  [facility]   [phone]

## 2021-08-17 NOTE — Unmapped (Signed)
Spoke with pt- he will have LFT's done in [place] on 2/24. I will wait for the results.

## 2021-08-22 LAB — CREATININE, WHOLE BLOOD
CREATININE, WHOLE BLOOD: 1.1 mg/dL (ref 0.8–1.4)
EGFR CKD-EPI (2021) MALE (WB): 70 mL/min/{1.73_m2} (ref >=60)

## 2021-08-22 MED ADMIN — iohexoL (OMNIPAQUE) 300 mg iodine/mL solution 100 mL: 100 mL | INTRAVENOUS | @ 20:00:00 | Stop: 2021-08-22

## 2021-08-22 NOTE — Unmapped (Signed)
Patient arrived for pre sim appointment, consent IVC, and sim. No complaints voiced

## 2021-08-22 NOTE — Unmapped (Signed)
Pt arrived to recovery for PIV placement and labs prior to CT SIM. IV contrast screening form was completed.

## 2021-08-23 NOTE — Unmapped (Signed)
Radiation Oncology Treatment Planning Note    Patient Name: Eric Good  Patient Age: 76 y.o.  Date of Encounter: 08/22/2021    Diagnosis:   1. 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 Please refer to the consult for full clinical details.    I plan to treat him with photons utilizing IMRT  technique.     Treatment Site: prostate and pelvic nodes    I will attempt to minimize the dose to the gastrointenstinal system.    The total radiation dose will be 7000 cGy at 250 cGy/fraction for a total of 28 fractions, treated once a day. Chemotherapy not administered.    Technique Rationale:   IMRT:  IMRT is anticipated to be necessary to optimize the dose homogeneity to a complex tumor/target volume and minimize the dose to GI system    Simulation Order: I requested a radiation treatment planning CT scan to acquire information regarding the patient's anatomy at the treatment site, radiation treatment target volumes, and adjacent normal organs. This is required to be done in the patient's treatment position for radiation treatment planning and for the patient's subsequent radiation treatment positioning.      Image Fusion:  I have ordered that the following additional imaging studies be fused to the CT simulation scan to aid in tumor mapping and treatment: PET/CT.    Verification Simulation: I have requested for the patient to come in for verification simulation on the treatment machine to ensure that information was transferred appropriately from the planning system to the treatment machine treatment and to verify patient setup, immobilization, and image guidance.    Image Guidance/Tracking: Daily CBCT/MVCT to assess the position accuracy of the target volume and critical structures.       SIMULATION:    Type of Simulation: Initial simulation of the prostate    Contrast: IV contrast.    The patient was taken to the CT simulation room and placed in a supine position .  CT images were obtained from the pelvis. I approved the patient set up and reviewed the CT images; both are adequate. I tentatively plan to utilize multiple fields. The number and use of treatment devices will be determined at the time of computerized planning.    I have placed an isocenter/localization point in three dimensions on these images.  This was marked on the patient???s skin for subsequent radiation treatment set-up.  Additional details of the CT simulation are available in the departmental [facility] electronic medical record.  CT images were then transferred to the radiation treatment-planning computer for planning and dosimetry.      Burlene Arnt, MD, PhD  Assistant Professor  Department of Radiation Oncology  [facility]    08/22/2021  11:29 PM

## 2021-08-23 NOTE — Unmapped (Signed)
Radiation Oncology Treatment Planning Note    Patient Name: Eric Good  Patient Age: 76 y.o.  Date of Encounter: 08/22/2021    Diagnosis:   1. 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 Please refer to the consult for full clinical details.    I plan to treat him with photons utilizing SBRT technique.     Treatment Site: left scapula and prostate    I will attempt to minimize the dose to the lungs.    The total radiation dose will be 3000 cGy at 1000 cGy/fraction for a total of 3 fractions, treated once a day. Chemotherapy not administered.    Technique Rationale:     RADIOSURGERY:  The use of SBRT is anticipated to be necessary to deliver high dose per fraction and minimize dose to surrounding normal tissue structures including the lungs and spinal cord.    Image Fusion:  I have ordered that the following additional imaging studies be fused to the CT simulation scan to aid in tumor mapping and treatment: PET/CT.    Verification Simulation: I have requested for the patient to come in for verification simulation on the treatment machine to ensure that information was transferred appropriately from the planning system to the treatment machine treatment and to verify patient setup, immobilization, and image guidance.    Image Guidance/Tracking: Daily CBCT/MVCT to assess the position accuracy of the target volume and critical structures.       SIMULATION:    Type of Simulation: Initial simulation of the left scapula    The patient was taken to the CT simulation room and placed in a supine position with customized immobilization and/or position devices including vacloc.   CT images were obtained of the thorax. I approved the patient set up and reviewed the CT images; both are adequate. I tentatively plan to utilize multiple fields. The number and use of treatment devices will be determined at the time of computerized planning.    I have placed an isocenter/localization point in three dimensions on these images.  This was marked on the patient???s skin for subsequent radiation treatment set-up.  Additional details of the CT simulation are available in the departmental [facility] electronic medical record.  CT images were then transferred to the radiation treatment-planning computer for planning and dosimetry.      Burlene Arnt, MD, PhD  Assistant Professor  Department of Radiation Oncology  [facility]    08/22/2021  11:13 PM

## 2021-08-26 ENCOUNTER — Ambulatory Visit: Admit: 2021-08-26 | Discharge: 2021-08-27 | Payer: MEDICARE

## 2021-08-26 LAB — HEPATIC FUNCTION PANEL
ALBUMIN: 4.1 g/dL (ref 3.4–5.0)
ALKALINE PHOSPHATASE: 57 U/L (ref 46–116)
ALT (SGPT): 23 U/L (ref 10–49)
AST (SGOT): 21 U/L (ref <=34)
BILIRUBIN DIRECT: 0.3 mg/dL (ref 0.00–0.30)
BILIRUBIN TOTAL: 1 mg/dL (ref 0.3–1.2)
PROTEIN TOTAL: 6.5 g/dL (ref 5.7–8.2)

## 2021-08-26 LAB — POTASSIUM: POTASSIUM: 4.7 mmol/L (ref 3.5–5.1)

## 2021-08-26 LAB — PSA: PROSTATE SPECIFIC ANTIGEN: 1.72 ng/mL (ref 0.00–4.00)

## 2021-08-31 ENCOUNTER — Ambulatory Visit
Admit: 2021-08-31 | Discharge: 2021-09-07 | Payer: MEDICARE | Attending: Radiation Oncology | Primary: Radiation Oncology

## 2021-08-31 ENCOUNTER — Ambulatory Visit
Admit: 2021-08-31 | Discharge: 2021-09-24 | Payer: MEDICARE | Attending: Student in an Organized Health Care Education/Training Program | Primary: Student in an Organized Health Care Education/Training Program

## 2021-08-31 ENCOUNTER — Ambulatory Visit
Admit: 2021-08-31 | Discharge: 2021-09-08 | Payer: MEDICARE | Attending: Radiation Oncology | Primary: Radiation Oncology

## 2021-08-31 ENCOUNTER — Ambulatory Visit: Admit: 2021-08-31 | Discharge: 2021-09-01 | Payer: MEDICARE

## 2021-08-31 ENCOUNTER — Ambulatory Visit
Admit: 2021-08-31 | Discharge: 2021-09-06 | Payer: MEDICARE | Attending: Student in an Organized Health Care Education/Training Program | Primary: Student in an Organized Health Care Education/Training Program

## 2021-08-31 ENCOUNTER — Ambulatory Visit
Admit: 2021-08-31 | Discharge: 2021-09-30 | Payer: MEDICARE | Attending: Student in an Organized Health Care Education/Training Program | Primary: Student in an Organized Health Care Education/Training Program

## 2021-09-01 ENCOUNTER — Ambulatory Visit
Admit: 2021-09-01 | Discharge: 2021-09-02 | Payer: MEDICARE | Attending: Hematology & Oncology | Primary: Hematology & Oncology

## 2021-09-01 ENCOUNTER — Other Ambulatory Visit: Admit: 2021-09-01 | Discharge: 2021-09-02 | Payer: MEDICARE

## 2021-09-01 DIAGNOSIS — C61 Malignant neoplasm of prostate: Principal | ICD-10-CM

## 2021-09-01 DIAGNOSIS — C7951 Secondary malignant neoplasm of bone: Principal | ICD-10-CM

## 2021-09-01 DIAGNOSIS — C778 Secondary and unspecified malignant neoplasm of lymph nodes of multiple regions: Principal | ICD-10-CM

## 2021-09-01 LAB — HEPATIC FUNCTION PANEL
ALBUMIN: 4.1 g/dL (ref 3.4–5.0)
ALKALINE PHOSPHATASE: 64 U/L (ref 46–116)
ALT (SGPT): 39 U/L (ref 10–49)
AST (SGOT): 31 U/L (ref <=34)
BILIRUBIN DIRECT: 0.2 mg/dL (ref 0.00–0.30)
BILIRUBIN TOTAL: 0.6 mg/dL (ref 0.3–1.2)
PROTEIN TOTAL: 6.5 g/dL (ref 5.7–8.2)

## 2021-09-01 LAB — POTASSIUM: POTASSIUM: 4.6 mmol/L (ref 3.5–5.1)

## 2021-09-01 LAB — PSA: PROSTATE SPECIFIC ANTIGEN: 1.33 ng/mL (ref 0.00–4.00)

## 2021-09-01 NOTE — Unmapped (Signed)
[facility] Medical Oncology Visit Note    Patient Name: Eric Good  Patient Age: 76 y.o.  Encounter Date: 09/01/2021  Attending Provider:  Jumaane Weatherford E. Philomena Course, MD  Referring physician: Maurie Boettcher, MD    Assessment  Patient Active Problem List   Diagnosis   ??? Non-sustained ventricular tachycardia   ??? Pre-syncope   ??? PVC (premature ventricular contraction)   ??? Prostate cancer (CMS-HCC)   ??? 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 Family history of osteoporosis in father   ??? 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    Eric Good is a 46 y.o. man with newly diagnosed de novo metastatic prostate cancer, T2N1M1a, involving pelvic and retroperitoneal lymph nodes on conventional CT and bone scans. PSA being 45 and Gleason 4+5=9 disease also support this diagnosis.    I discussed with the patient the long term prognosis of prostate cancer and treatment options, such as androgen deprivation therapy, and benefit and side effects of ADT.  Side effects includes but are not limited to loss of libido, erectile dysfunction, hot flashes, fatigue, weight gain, metabolic syndrome that increases the risk of diabetes and cardiovascular events, osteoporosis, and reduced muscle mass.  I have counseled the patient on the need to take calcium and vitamin D as well as regular weight bearing exercise to minimize the side effects of ADT. It is expected that virtually all patients will develop progressive disease while on ADT and at that time additional treatment would be required, but ultimately, the patient may pass away from prostate cancer.    A series of recent studies demonstrate the benefit of additional therapy (docetaxel per CHAARTED and STAMPEDE, abiraterone/prednisone per LATITUTUDE, enzalutamide per ARCHES and ENZAMET, apalutamide per TITAN).  Therefore, the current option would be to add one of AR targeted agents. Docetaxel chemo would not be recommended for low volume patients.  I discussed the consideration of additional therapy and the benefit and side effects of an agent such as abiraterone.    On 06/16/21, patient returns after PSMA-PET, which showed uptake in the prostate, multiple retrocaval and left iliac lymph nodes, and the left scapular body, concerning for metastatic disease. We discussed the implications of these findings and re-discussed the risks and benefits of ADT as well as the possible benefit from the addition of an AR-targeted therapy. Patient elected today to start treatment with ADT.    In 07/2021, PSA down on ADT. After discussion of benefit/side effects, abiraterone and prednisone prescribed. Will get somatic tumor genomic testing. Also referral to radiation oncology.    In 08/2021, PSA is 1.33. His LFT's are okay. I recommend obtaining germline testing at his next visit. I also spoke to him about joining the [employer] study. Pt was agreeable to both.      Plan  1. Continue ADT with Eligard 45 mg subcutaneous, given last on 06/16/2021, due next on 12/15/2021.  -- Abiraterone 1000 mg every day and prednisone 5 mg daily. Follow LFT's, K, BP.  -- I do not recommend docetaxel chemo, since low volume disease.  2. Radiation to the prostate may be beneficial given low volume metastatic disease. Will start IMRT to the prostate and pelvic nodes and SRBT to the left scapula and prostate in 08/2021.  3. Somatic tumor mutation profiling done, no targetable mutations  4. Ordered Misc DNA Sendout  for custom [facility] prostate cancer panel, testing the following genes: BRCA1, BRCA2, ATM, BARD1, BRIP1, FANCA, CHEK2, HOXB13, MLH1, MSH2, MSH6, PMS2, EPCAM, PALB2, RAD51C, RAD51D, TP53   - Ordered E-consult to Adult Genetics--appreciate assistance with interpretation of results  - Patient educated on purpose, risks, benefits, and alternatives for genetic testing as well as e-consult. Specifically discussed price of genetic testing and e-consult; noted that we often leave genetic testing to Genetics, and if patient does not desire e-consult for streamlined genetic testing we will place an ambulatory referral.   5. Lab in 2 weeks.  5. Follow-up in 1 month.     I personally spent 40 minutes face-to-face and non-face-to-face in the care of this patient, which includes all pre, intra, and post visit time on the date of service.  All documented time was specific to the E/M visit and does not include any procedures that may have been performed.      Reason for Visit  Follow up of prostate cancer    History of Present Illness:  Oncology History Overview Note   ??? In 04/2021, screening PSA of 49, then 45.3. DRE abnormal, firm at L base  ??? In 05/2021, prostate biopsy showed Gleason 4+5=9, 6/12 cores involved. CT showed retroperitoneal and pelvic adenopathy. Bone scan showed single focus in L scapula, nonspecific.  ??? In 06/2021, PSMA PET showed retroperitoneal/pelvic node mets, L scapular met.  ADT started with Eligard  ??? 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 Pt is doing well. He notes continued hot flashes and no issues with urination. He also notes chest pressure when he is exercising.       Allergies:  Allergies   Allergen Reactions   ??? Flonase [Fluticasone]      Nose bleed   ??? Budesonide      Other reaction(s): Other (See Comments)  Epistaxis       Current Medications:    Current Outpatient Medications:   ???  abiraterone (ZYTIGA) 250 mg Tab tablet, Take 4 tablets (1,000 mg total) by mouth daily., Disp: 120 tablet, Rfl: 11  ???  fish oil-omega-3 fatty acids 300-1,000 mg capsule, Take 2 g by mouth daily., Disp: , Rfl:   ???  melatonin 3 mg Tab, Take 3 mg by mouth nightly as needed., Disp: , Rfl:   ???  multivitamin (  TAB-A-VITE/THERAGRAN) per tablet, Take 1 tablet by mouth daily., Disp: , Rfl:   ???  naphazoline-pheniramine (NAPHCON-A) 0.025-0.3 % ophthalmic solution, Apply to eye every hour as needed., Disp: , Rfl:   ???  predniSONE (DELTASONE) 5 MG tablet, Take 1 tablet (5 mg total) by mouth daily., Disp: 30 tablet, Rfl: 11  ???  vitamin B complex vit C no.3 (VITAMIN B COMP AND C NO.3 ORAL), Take by mouth daily as needed. , Disp: , Rfl:   ???  vitamin D3-folic acid 125 mcg (5,000 unit)-1 mg Tab, Take 125 mcg by mouth in the morning., Disp: , Rfl:   ???  zinc gluconate 50 mg (7 mg elemental zinc) tablet, Take 50 mg by mouth daily., Disp: , Rfl:     Past Medical History and Social History  Past Medical History:   Diagnosis Date   ??? Rapid heart beat     Negative cardiac workup in 2020      No past surgical history on file.     Social History     Occupational History   ??? Not on file   Tobacco Use   ??? Smoking status: Some Days     Types: Cigars     Start date: 12/11/2016   ??? Smokeless tobacco: Never   Substance and Sexual Activity   ??? Alcohol use: Not on file   ??? Drug use: Not on file   ??? Sexual activity: Not on file       Family History    Prostate Cancer Family History Assessment:  ??? History of cancer in children (yes/no; if yes, what type AND age of diagnosis): No  ??? History of cancer in siblings (yes/no; if yes, provide relation, type of cancer, AND age of diagnosis): No  ??? History of cancer in parents (yes/no; if yes, please specify parent, type of cancer, AND age of diagnosis): Yes, sister with stomach and eye cancer, age 50  ??? History of cancer in aunts/uncles/grandparents (yes/no; if yes, provide relation, type of cancer, AND age of diagnosis): No      Review of Systems:  A comprehensive review of 10 systems was negative except for pertinent positives noted in HPI.    Physical Exam:    VITAL SIGNS:  BP 135/84  - Pulse 71  - Temp 36.4 ??C (97.5 ??F) (Temporal)  - Resp 20  - Ht 175.3 cm (5' 9)  - Wt 86.7 kg (191 lb 3.2 oz)  - SpO2 100%  - BMI 28.24 kg/m??   ECOG Performance Status: 1  GENERAL: Well-developed, well-nourished patient in no acute distress.  HEAD: Normocephalic and atraumatic.  EYES: Conjunctivae are normal. No scleral icterus.  MOUTH/THROAT: Oropharynx is clear and moist.  No mucosal lesions.  NECK: Supple, no thyromegaly.  LYMPHATICS: No palpable cervical, supraclavicular, or axillary adenopathy.  CARDIOVASCULAR: Hemodynamically stable.   PULMONARY/CHEST: No auditory wheezing or stridor. No respiratory distress.  ABDOMINAL:  Soft. There is no distension. There is no tenderness. There is no rebound and no guarding.  MUSCULOSKELETAL: No clubbing, cyanosis, or lower extremity edema.  PSYCHIATRIC: Alert and oriented.  Normal mood and affect.  NEUROLOGIC: No focal motor deficit. Normal gait.  SKIN: Skin is warm, dry, and intact.      Results/Orders:      Lab on 09/01/2021   Component Date Value Ref Range Status   ??? PSA 09/01/2021 1.33  0.00 - 4.00 ng/mL Final   ??? Potassium 09/01/2021 4.6  3.5 - 5.1 mmol/L Final   ???  Albumin 09/01/2021 4.1  3.4 - 5.0 g/dL Final   ??? Total Protein 09/01/2021 6.5  5.7 - 8.2 g/dL Final   ??? Total Bilirubin 09/01/2021 0.6  0.3 - 1.2 mg/dL Final   ??? Bilirubin, Direct 09/01/2021 0.20  0.00 - 0.30 mg/dL Final   ??? AST 57/84/6962 31  <=34 U/L Final   ??? ALT 09/01/2021 39  10 - 49 U/L Final   ??? Alkaline Phosphatase 09/01/2021 64  46 - 116 U/L Final     Lab Results   Component Value Date    PSA 1.33 09/01/2021    PSA 1.72 08/26/2021    PSA 4.70 (H) 07/26/2021    PSA 42.86 (H) 06/16/2021         Orders placed or performed in visit on 09/01/21   ??? Ambulatory e-Consult to Cancer And Adult Genetics   ??? Clinic Appointment Request Physician, Lab   ??? Clinic Appointment Request Physician   ??? LAB Appointment Request   ??? Miscellaneous DNA Sendout           Molecular Pathology  Tumor mutation profiling on 08/01/18  TMB 0 m/Mb  MSS  APC  MTA  PTEN  CDKN2A/B  CIC  TP53    Germline testing  N/A    Pathology  05/04/2021  [A] PROSTATE, LEFT BASE: ?? 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 80% PATTERN 4;   CRIBRIFORM PATTERN 4 PRESENT   ??[B] PROSTATE, LEFT MID: ?? ACINAR ADENOCARCINOMA, GLEASON 4+3=7 ??(GG 3),   INVOLVING 1 OF 1 CORES, MEASURING 5 ??MM ( 83%). 80% PATTERN 4;   CRIBRIFORM PATTERN 4 PRESENT   ??[C] PROSTATE, LEFT APEX: ?? ACINAR ADENOCARCINOMA, GLEASON 4+3=7 ??(GG   3), INVOLVING 1??OF 2 CORES, MEASURING 4 ??MM ( 40%). 70% PATTERN 4;   CRIBRIFORM PATTERN 4 PRESENT   ??[D] PROSTATE, RIGHT BASE: ?? NEGATIVE FOR MALIGNANCY.   ??[E] PROSTATE, RIGHT MID: ?? NEGATIVE FOR MALIGNANCY.   ??[F] PROSTATE, RIGHT APEX: ?? NEGATIVE FOR MALIGNANCY.   ??[G] PROSTATE, LEFT LATERAL BASE: ?? ACINAR ADENOCARCINOMA, GLEASON 4+4=8   (GG 4), INVOLVING 1 OF 1 CORES, MEASURING 5 ??MM ( 50%). PERINEURAL   INVASION PRESENT   ??[H] PROSTATE, LEFT LATERAL MID: ?? ACINAR ADENOCARCINOMA, GLEASON 4+5=9   (GG 5), INVOLVING 1 OF 1 CORES, MEASURING 9 ??MM ( 100%). CRIBRIFORM   PATTERN 4 PRESENT   ??[I] PROSTATE, LEFT LATERAL APEX: ?? ACINAR ADENOCARCINOMA, GLEASON 4+3=7   (GG 3), INVOLVING 2 OF 2 CORES, MEASURING 8 ??MM ( 100%). 80% PATTERN 4;   CRIBRIFORM PATTERN 4 PRESENT; PERINEURAL INVASION PRESENT   ??[J] PROSTATE, RIGHT LATERAL BASE: ?? NEGATIVE FOR MALIGNANCY.   ??[K] PROSTATE, RIGHT LATERAL MID: ?? NEGATIVE FOR MALIGNANCY.   ??[L] PROSTATE, RIGHT LATERAL APEX: ?? NEGATIVE FOR MALIGNANCY.     Imaging results:  CT AP 05/19/2021  LYMPH NODES: Retroperitoneal and pelvic adenopathy with index lesions as follows  --1.5 cm retrocaval lymph node  -- 1.4 cm left common iliac lymph node.  --1.9 cm left external iliac lymph node  --1.8 cm left internal iliac lymph  node  IMPRESSION:  Retroperitoneal and  left pelvic adenopathy, suspicious for metastatic disease.  ??  Heterogeneous appearance of the prostate, consistent with known neoplasm.  ??  Indeterminate lesion in the right posterior 12th rib. Recommend correlation with concurrent bone scan.  ??  Mildly hyperenhancing right renal lesion, suspicious for cortical neoplasm. Recommend correlation with prior imaging if  available. If not, MRI of the abdomen with and without contrast may be performed for definitive characterization.    Bone scan 05/19/2021  Focal area of increased radiotracer uptake within the left scapula which could represent osseous metastasis or remote trauma, indeterminate      PET CT Skull Base to Thigh 06/13/2021  -Mild radiotracer uptake within the prostate, likely corresponding to patient's known neoplasm.   -Multiple radiotracer avid retrocaval and left iliac lymph nodes, as described above, concerning for metastatic disease. Tiny right iliac/pelvic sidewall lymph nodes with no significant uptake, indeterminate.   -Radiotracer uptake within the left scapular body, corresponding to increased tracer uptake on recent bone scan, concerning for osseous metastatic disease.      I attest that I, Barrington Ellison, personally documented this note while acting as scribe for Maurie Boettcher, MD.      Barrington Ellison, Scribe.  09/01/2021 ______________________________________________      Documentation assistance provided by Medical Scribe, Barrington Ellison, who was present during the entirety of the visit. I reviewed the note below and validated all of the information provided to ensure accuracy and completeness.     Maurie Boettcher, MD    ------------------------------------------------------------

## 2021-09-01 NOTE — Unmapped (Addendum)
Lab Results   Component Value Date    PSA 1.33 09/01/2021    PSA 1.72 08/26/2021    PSA 4.70 (H) 07/26/2021    PSA 42.86 (H) 06/16/2021   It was a pleasure meeting with you. We will obtain genetic testing at your next lab visit. I'll see you back in 1 month. Please call [phone] to reach my nurse navigator Mauricia Area for any issues.    For emergencies on Nights, Weekends and Holidays  Call [phone] for help.      Griffin Basil, MD, PhD  Associate Professor of Medicine  Division of Hematology-Oncology    [facility]  Genitourinary Oncology Clinic  Nurse Navigator: Mauricia Area  Fax: [phone]     What is genetic testing, and how it is used?  - Genes are instructions that help your body grow and develop. Some genes specifically help prevent cancer. We are ordering genetic testing of 17 genes, including BRCA1 and BRCA2.   - Genetic testing checks to see if these ???cancer prevention??? genes are working correctly.  Some people have a misspelling in their genes that puts them at an increased risk for prostate cancer and potentially other cancer  - Your blood sample will be collected and shipped to a lab called [facility]. Results of this test take 3 weeks to come back. You may see this result in your [facility] before your next appointment with your provider. If you have questions about this result, please contact your provider.  - This testing will be used to help direct your medical care, both in management of your current cancer and possibly to guide your future cancer screening. The test result can also provide information about the cancer risk for your family members.    How much does the test cost out of pocket?  - Most people pay less than $100 for genetic testing. Your medical insurance will most likely cover all or a large portion of the cost.   - Once [facility] receives your sample, they will contact you with an estimated out-of-pocket cost via text to a mobile number or email. If the cost of testing is too high through insurance, the laboratory has a self-pay price of $250. In this case, you would have to contact [facility] and choose the self-pay option. [facility] also has a financial assistance program that can help with the cost.   - If you are concerned about the cost of your genetic testing, please contact [facility] at [phone] or email [email or web address]     What are the possible results?  - Positive - If one of your cancer prevention genes doesn???t work, we know certain treatment options may work better. In addition, your relatives could have testing to see if they???re at a higher risk of developing cancer in their lifetime.  - Negative -  If you don???t have a gene change, then other treatment options may be recommended.   - Variant of Uncertain Significance (VUS) - Sometimes we get an unclear result, but these are typically treated as negative results. The lab will follow this result over time for additional information about the variant in the scientific literature.     Any possible risks?  - Risks - this test may cost money out of pocket--maximum $250, although most patients pay nothing since the test is often covered by insurance. Some patients are also concerned with privacy of results. There are laws that protect the privacy of your genetic test results.   -  Benefits - this test may help your doctor choose the best treatment for your cancer. It may also provide additional information on cancer risks for you and your family.     What is an electronic consult?  - To help [place] interpret any of your genetic test results, we would like to ask our colleagues in [place] to help [place]. Their team will look at your medical record and ensure we are ordering the best test for you. This is called an ???electronic consult???. This will not require a separate appointment for most patients.   - Most patients will not have an out-of-pocket expense for this. If your insurance does not cover this service, the maximum bill you could receive would be around ~$100. Most patients will pay far less than that.

## 2021-09-01 NOTE — Unmapped (Signed)
Blood collected and sent for processing. Care provided by Kandice Moos, RN.

## 2021-09-02 NOTE — Unmapped (Cosign Needed)
Informed Consent Documentation  Study: C46-170 IRONMAN Registry     I met with patient Eric Good  to discuss consent for the [facility] trial. The protocol was reviewed including discussion of risks & benefits, confidentiality, time commitments involved, study contact list, and the option to withdraw at any time. Alternatives to study participation were discussed. The patient was given reasonable time to consider participation in the study, in the absence of coercion or undue influence. Patient was offered an opportunity for questions and these questions were answered. Patient verbalized understanding of information presented.      The patient has signed the study informed consent and HIPAA Authorization Form as follows:   X     In my presence  A copy of the signed informed consent and HIPAA Authorization Form was given to the patient.  The informed consent and HIPAA Authorization Form will be placed in the regulatory files in the Office of Clinical & Translational Research as well as scanned into [employer]. Every effort to maintain confidentiality will be employed.     Date: __3/2/2023_____     X     HIPAA Form Signature:  Same as above        Plan: Patient to take part in blood collection at:  ??? Enrollment  ??? Time of first change in treatment (due to progression of disease)  OR  ??? At the 12 Month follow up visit if a patient has not changed treatment (due to progression of disease) prior to the 12 Month follow up visit    Additionally, every effort will be made to collect blood specimens at each subsequent change in treatment due to progression of disease. When feasible, existing tumor tissue may be collected for correlation with described blood based studies. All samples will be used for future research. Results from standard of care next generation tumor sequencing will also be collected, if available.    Detailed data will be collected from patients at study enrollment and then during follow-up, for a minimum of five years. Patients will be followed prospectively for overall survival, clinically significant adverse events, comorbidities, changes in cancer treatments, and PROMs.    Eligibility Criteria:    To be included in this Registry, individuals should meet all of the following criteria at the time of registration:     1. Willing and able to provide written informed consent and privacy authorization for the release of personal health information. Yes, informed consent form and HIPAA signed 09/01/21.    2. Males 89 years of age and above. Yes, DOB 11-27-1945.     3. Histological or cytological confirmed prostate cancer from prostate biopsy, radical prostatectomy or TURP. Yes, diagnostic biopsy collected 05/04/2021, confirms prostate cancer.     4. Metastatic hormone sensitive prostate cancer (mHSPC):   a) Metastatic disease M1a, b, or c stage as defined by the American Joint Committee on Cancer. Yes, per progress notes on 07/26/21, Metastatic hormone sensitive prostate cancer     b) No more than 90 days of active systemic therapy (including ADT) at the time of consent. Yes, ADT started 06/16/2021.    5. No current or prior non-prostate cancer requiring systemic therapy within the last two years. Per medical history no current or prior non-prostate cancer requiring systemic therapy.

## 2021-09-02 NOTE — Unmapped (Addendum)
[facility] Clinical Assessment & Refill Coordination Note    Jiair Good, DOB: 07/27/1945  Phone: [phone] (home)     All above HIPAA information was verified with patient.     Was a Nurse, learning disability used for this call? No    Specialty Medication(s):   Hematology/Oncology: Zytiga 250mg      Current Outpatient Medications   Medication Sig Dispense Refill   ??? abiraterone (ZYTIGA) 250 mg Tab tablet Take 4 tablets (1,000 mg total) by mouth daily. 120 tablet 11   ??? fish oil-omega-3 fatty acids 300-1,000 mg capsule Take 2 g by mouth daily.     ??? melatonin 3 mg Tab Take 3 mg by mouth nightly as needed.     ??? multivitamin (TAB-A-VITE/THERAGRAN) per tablet Take 1 tablet by mouth daily.     ??? naphazoline-pheniramine (NAPHCON-A) 0.025-0.3 % ophthalmic solution Apply to eye every hour as needed.     ??? predniSONE (DELTASONE) 5 MG tablet Take 1 tablet (5 mg total) by mouth daily. 30 tablet 11   ??? vitamin B complex vit C no.3 (VITAMIN B COMP AND C NO.3 ORAL) Take by mouth daily as needed.      ??? zinc gluconate 50 mg (7 mg elemental zinc) tablet Take 50 mg by mouth daily.       No current facility-administered medications for this visit.        Changes to medications: Burleigh reports no changes at this time.    Allergies   Allergen Reactions   ??? Flonase [Fluticasone]      Nose bleed   ??? Budesonide      Other reaction(s): Other (See Comments)  Epistaxis       Changes to allergies: No    SPECIALTY MEDICATION ADHERENCE     Abiraterone 250 mg: 8 days of medicine on hand   Medication Adherence    Patient reported X missed doses in the last month: 0  Specialty Medication: Abiraterone 250mg  - 4 tabs per day  Patient is on additional specialty medications: No  Patient is on more than two specialty medications: No  Any gaps in refill history greater than 2 weeks in the last 3 months: no  Demonstrates understanding of importance of adherence: yes  Informant: patient  Reliability of informant: reliable  Patient is at risk for Non-Adherence: No  Confirmed plan for next specialty medication refill: delivery by pharmacy  Refills needed for supportive medications: yes, ordered or provider notified          Specialty medication(s) dose(s) confirmed: Regimen is correct and unchanged.     Are there any concerns with adherence? No    Adherence counseling provided? Not needed    CLINICAL MANAGEMENT AND INTERVENTION      Clinical Benefit Assessment:    Do you feel the medicine is effective or helping your condition? Yes    Clinical Benefit counseling provided? Not needed    Adverse Effects Assessment:    Are you experiencing any side effects? Yes, patient reports experiencing heartburn . Side effect counseling provided: Use tums or Nexium (no DDI)    Are you experiencing difficulty administering your medicine? No    Quality of Life Assessment:    Quality of Life    Rheumatology  Oncology  1. What impact has your specialty medication had on the reduction of your daily pain or discomfort level?: Some  2. On a scale of 1-10, how would you rate your ability to manage side effects  associated with your specialty medication? (1=no issues, 10 = unable to take medication due to side effects): 1  Dermatology  Cystic Fibrosis          How many days over the past month did your prostate cancer  keep you from your normal activities? For example, brushing your teeth or getting up in the morning. 0    Have you discussed this with your provider? Not needed    Acute Infection Status:    Acute infections noted within Epic:  No active infections  Patient reported infection: None    Therapy Appropriateness:    Is therapy appropriate and patient progressing towards therapeutic goals? Yes, therapy is appropriate and should be continued    DISEASE/MEDICATION-SPECIFIC INFORMATION      N/A    PATIENT SPECIFIC NEEDS     - Does the patient have any physical, cognitive, or cultural barriers? No    - Is the patient high risk? Yes, patient is taking oral chemotherapy. Appropriateness of therapy as been assessed    - Does the patient require a Care Management Plan? No     SOCIAL DETERMINANTS OF HEALTH     At the [facility], we have learned that life circumstances - like trouble affording food, housing, utilities, or transportation can affect the health of many of our patients.   That is why we wanted to ask: are you currently experiencing any life circumstances that are negatively impacting your health and/or quality of life? No    Social Determinants of Health     Food Insecurity: Not on file   Tobacco Use: High Risk   ??? Smoking Tobacco Use: Some Days   ??? Smokeless Tobacco Use: Never   ??? Passive Exposure: Not on file   Transportation Needs: Not on file   Alcohol Use: Not on file   Housing/Utilities: Not on file   Substance Use: Not on file   Financial Resource Strain: Not on file   Physical Activity: Not on file   Health Literacy: Not on file   Stress: Not on file   Intimate Partner Violence: Not on file   Depression: Not on file   Social Connections: Not on file       Would you be willing to receive help with any of the needs that you have identified today? Not applicable       SHIPPING     Specialty Medication(s) to be Shipped:   Hematology/Oncology: Zytiga 250mg     Other medication(s) to be shipped: Prednisone 5mg      Changes to insurance: No    Delivery Scheduled: Yes, Expected medication delivery date: 3/9.     Medication will be delivered via Next Day Courier to the confirmed prescription address in [facility].    The patient will receive a drug information handout for each medication shipped and additional FDA Medication Guides as required.  Verified that patient has previously received a Conservation officer, historic buildings and a Surveyor, mining.    The patient or caregiver noted above participated in the development of this care plan and knows that they can request review of or adjustments to the care plan at any time.      All of the patient's questions and concerns have been addressed.    Dexter S[employer]   [facility] Specialty Pharmacist    I reviewed this patient case and all documentation provided by the learner and was readily available for consultation during their interaction with the  patient.  I agree with the assessment and plan listed below.    Breck Coons [facility] Specialty Pharmacist

## 2021-09-05 NOTE — Unmapped (Signed)
09/05/2021    Dose Site Summary:    Subjective/Assessment/Recommendations:    1. Fatigue: No issues  2. Pain: Denies pain  3. Urinary Elimination: Urgency  4. Bowel Elimination: No issues  5. Prescription Needs: None  6. Psychosocial: Has home support  7. Screening form: Pt was not given a form at check in

## 2021-09-06 NOTE — Unmapped (Signed)
Radiation Oncology SBRT (Linac/Cyberknife) Daily Procedure Note    Date of Procedure: 09/06/2021    Patient Name: Eric Good          MR#: [id number]     Diagnosis:   1. Prostate cancer (CMS-HCC)    2. 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 is here today for his second fraction of SBRT.  We are planning a total of 3 fractions using CyberKnife and Linac-based SBRT.      All appropriate QA measures were taken to assure treatment accuracy:  Pre-RT CBCT reviewed, Shifts were applied as needed per review of the anatomy, Treatment parameters were verified to be appropriate and Set up and all is OK to proceeed with RT as planned  Appropriate image guidance is performed.    Treatment Site: left scapula    Tumor Volume Fx dose: 1000 cGy    Total dose thus far received: 2000 cGy/3000 cGy    The treatment was delivered without incident.     Patient will return for Follow-Up: for their next fraction in a day or a few days       Frances Maywood, MD  September 06, 2021   1:46 PM

## 2021-09-07 MED FILL — PREDNISONE 5 MG TABLET: ORAL | 30 days supply | Qty: 30 | Fill #1

## 2021-09-07 MED FILL — ABIRATERONE 250 MG TABLET: ORAL | 30 days supply | Qty: 120 | Fill #1

## 2021-09-07 NOTE — Unmapped (Signed)
Radiation Oncology SBRT (Linac/Cyberknife) Daily Procedure Note  ??  Date of Procedure: 09/05/2021  ??  Patient Name: Eric Good?????????????????? MR#: [id number]  ??  Diagnosis:   1. Prostate cancer (CMS-HCC)    2. 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  ??  [name] is here today for his second fraction of SBRT.  We are planning a total of 3 fractions using CyberKnife and Linac-based SBRT.  ??  ??  All appropriate QA measures were taken to assure treatment accuracy:  Pre-RT CBCT reviewed, Shifts were applied as needed per review of the anatomy, Treatment parameters were verified to be appropriate and Set up and all is OK to proceeed with RT as planned  Appropriate image guidance is performed.  ??  Treatment Site: left scapula  ??  Tumor Volume Fx dose: 1000 cGy  ??  Total dose thus far received: 1000 cGy/3000 cGy  ??  The treatment was delivered without incident.??  ??  Patient will return for Follow-Up: for their next fraction in a day or a few days  ??    N. Alonna Buckler, MD, PhD  Assistant Professor  Department of Radiation Oncology  [facility]  [id number]  [place], [place] [address]  O: [phone]  09/07/21 8:59 AM

## 2021-09-12 ENCOUNTER — Ambulatory Visit: Admit: 2021-09-12 | Discharge: 2021-09-13 | Payer: MEDICARE

## 2021-09-12 LAB — PSA: PROSTATE SPECIFIC ANTIGEN: 1.06 ng/mL (ref 0.00–4.00)

## 2021-09-12 LAB — POTASSIUM: POTASSIUM: 3.9 mmol/L (ref 3.5–5.1)

## 2021-09-12 LAB — HEPATIC FUNCTION PANEL
ALBUMIN: 3.8 g/dL (ref 3.4–5.0)
ALKALINE PHOSPHATASE: 52 U/L (ref 46–116)
ALT (SGPT): 19 U/L (ref 10–49)
AST (SGOT): 23 U/L (ref <=34)
BILIRUBIN DIRECT: 0.3 mg/dL (ref 0.00–0.30)
BILIRUBIN TOTAL: 0.9 mg/dL (ref 0.3–1.2)
PROTEIN TOTAL: 6.2 g/dL (ref 5.7–8.2)

## 2021-09-20 ENCOUNTER — Ambulatory Visit
Admit: 2021-09-20 | Discharge: 2021-09-21 | Payer: MEDICARE | Attending: Student in an Organized Health Care Education/Training Program | Primary: Student in an Organized Health Care Education/Training Program

## 2021-09-21 ENCOUNTER — Ambulatory Visit: Admit: 2021-09-21 | Discharge: 2021-09-22

## 2021-09-21 NOTE — Unmapped (Signed)
Eric Good is here to start radiation for the prostate. Pre-treatment imaging verified.

## 2021-09-22 ENCOUNTER — Ambulatory Visit: Admit: 2021-09-22 | Discharge: 2021-09-23 | Payer: MEDICARE

## 2021-09-23 ENCOUNTER — Ambulatory Visit: Admit: 2021-09-23 | Discharge: 2021-09-24

## 2021-09-23 NOTE — Unmapped (Signed)
09/23/2021    Dose Site Summary:    Subjective/Assessment/Recommendations:    1. Fatigue: No issues  2. Pain: Denies pain  3. Urinary Elimination: Frequency  4. Bowel Elimination: No issues  5. Prescription Needs: None  6. Psychosocial: Has limited support  7. Screening form: Completed

## 2021-09-26 ENCOUNTER — Ambulatory Visit: Admit: 2021-09-26 | Discharge: 2021-09-27 | Payer: MEDICARE

## 2021-09-26 LAB — POTASSIUM: POTASSIUM: 3.8 mmol/L (ref 3.5–5.1)

## 2021-09-26 LAB — HEPATIC FUNCTION PANEL
ALBUMIN: 3.9 g/dL (ref 3.4–5.0)
ALKALINE PHOSPHATASE: 58 U/L (ref 46–116)
ALT (SGPT): 57 U/L — ABNORMAL HIGH (ref 10–49)
AST (SGOT): 36 U/L — ABNORMAL HIGH (ref <=34)
BILIRUBIN DIRECT: 0.4 mg/dL — ABNORMAL HIGH (ref 0.00–0.30)
BILIRUBIN TOTAL: 1.1 mg/dL (ref 0.3–1.2)
PROTEIN TOTAL: 6.3 g/dL (ref 5.7–8.2)

## 2021-09-26 LAB — PSA: PROSTATE SPECIFIC ANTIGEN: 0.7 ng/mL (ref 0.00–4.00)

## 2021-09-27 ENCOUNTER — Ambulatory Visit: Admit: 2021-09-27 | Discharge: 2021-09-28

## 2021-09-28 ENCOUNTER — Ambulatory Visit: Admit: 2021-09-28 | Discharge: 2021-09-29

## 2021-09-29 ENCOUNTER — Ambulatory Visit: Admit: 2021-09-29 | Discharge: 2021-09-30

## 2021-09-30 ENCOUNTER — Ambulatory Visit: Admit: 2021-09-30 | Discharge: 2021-10-01 | Payer: MEDICARE

## 2021-09-30 NOTE — Unmapped (Signed)
[facility] Refill Coordination Note    Specialty Medication(s) to be Shipped:   Hematology/Oncology: abiraterone 250mg  and Transplant: Prednisone 5mg     Other medication(s) to be shipped: No additional medications requested for fill at this time     Eric Good, DOB: 11-16-1945  Phone: [phone] (home)       All above HIPAA information was verified with patient.     Was a Nurse, learning disability used for this call? No    Completed refill call assessment today to schedule patient's medication shipment from the [facility] [phone]).  All relevant notes have been reviewed.     Specialty medication(s) and dose(s) confirmed: Regimen is correct and unchanged.   Changes to medications: Undra reports no changes at this time.  Changes to insurance: No  New side effects reported not previously addressed with a pharmacist or physician: None reported  Questions for the pharmacist: No    Confirmed patient received a Conservation officer, historic buildings and a Surveyor, mining with first shipment. The patient will receive a drug information handout for each medication shipped and additional FDA Medication Guides as required.       DISEASE/MEDICATION-SPECIFIC INFORMATION        N/A    SPECIALTY MEDICATION ADHERENCE     Medication Adherence    Patient reported X missed doses in the last month: 0  Specialty Medication: Abiraterone 250 mg  Patient is on additional specialty medications: Yes  Additional Specialty Medications: Prednisone 5mg   Patient Reported Additional Medication X Missed Doses in the Last Month: 0  Patient is on more than two specialty medications: No  Informant: patient              Were doses missed due to medication being on hold? No    Abiraterone 250 mg: 12 days of medicine on hand   Prednisone 5 mg: 12 days of medicine on hand       REFERRAL TO PHARMACIST     Referral to the pharmacist: Not needed      [facility]     Shipping address confirmed in Epic.     Delivery Scheduled: Yes, Expected medication delivery date: 10/08/21.     Medication will be delivered via Next Day Courier to the prescription address in Epic [place].    [name] M [name]   [facility] Specialty Technician

## 2021-09-30 NOTE — Unmapped (Signed)
Radiation Treatment Management Note  Patient: Eric Good  Visit Date: 09/30/2021    Diagnosis:  Prostate cancer    ASSESSMENT:   Rafiq Bucklin is a 76 y.o. M with high risk prostate cancer, cT1c, prostate biopsy 05/04/21 showed Gleason 4+5=9 disease, 100% total cores involved, PSA (04/15/21) 49.44, and CT A/P showing enlarged left pelvic and left periaortic lymph nodes concerning for metastatic disease.     Summary of Radiotherapy  2250cGy of 7000cGy planned. ADT: yes, per medical oncology    PLAN  Prostate cancer: Continue radiation treatment as planned  Symptoms management: We will monitor for acute side effects.   Follow-up: We will reassess the patient next week  -------------------------------------------    Subjective:   Patient is here for review of her progress during radiation treatment. He notes new right shoulder (contralateral to recent scapular treatment) and right elbow pain.  Denies any recent trauma or change in exercise regimen. Continues to exercise daily. He has mild fatigue, stable LUTS and bowel symptoms.    Current Signs/Symptoms:  General:  Overall he is not maintaining energy  Urinary: without changes in urinary symptoms  GI: without changes in bowel symptoms    Baseline Signs/Symptoms:   General:??Endorses being fully functional for all ADLs and IADLs  Urinary:??Denies dysuria urgency frequency nocturia incontinence  Sexual Function: is not sexually active.   GI: Denies loose stools, rectal pain, or bleeding.  Inflammatory bowel disease: No  Anticoagulation: No    Physical Examination:  Temp 36.4 ??C (97.5 ??F) (Temporal)  - Wt 88.9 kg (196 lb)  - BMI 28.94 kg/m??    Karnofsky/Lansky Performance Status:  100, Fully active, able to carry on all pre-disease performed without restriction (ECOG equivalent 0)  General: Well-appearing, no acute distress  HEENT: Normocephalic, moist mucous membranes  Respiratory: Breathing comfortably on room air    Luiz Iron, MD  Radiation Oncology PGY-2

## 2021-10-03 ENCOUNTER — Ambulatory Visit
Admit: 2021-10-03 | Discharge: 2021-10-08 | Payer: MEDICARE | Attending: Student in an Organized Health Care Education/Training Program | Primary: Student in an Organized Health Care Education/Training Program

## 2021-10-03 ENCOUNTER — Ambulatory Visit
Admit: 2021-10-03 | Discharge: 2021-10-22 | Payer: MEDICARE | Attending: Student in an Organized Health Care Education/Training Program | Primary: Student in an Organized Health Care Education/Training Program

## 2021-10-03 ENCOUNTER — Ambulatory Visit: Admit: 2021-10-03 | Discharge: 2021-10-04 | Payer: MEDICARE

## 2021-10-03 ENCOUNTER — Ambulatory Visit
Admit: 2021-10-03 | Discharge: 2021-10-15 | Payer: MEDICARE | Attending: Student in an Organized Health Care Education/Training Program | Primary: Student in an Organized Health Care Education/Training Program

## 2021-10-03 ENCOUNTER — Ambulatory Visit: Admit: 2021-10-03 | Discharge: 2021-10-04

## 2021-10-03 ENCOUNTER — Ambulatory Visit: Admit: 2021-10-03 | Payer: MEDICARE

## 2021-10-04 ENCOUNTER — Ambulatory Visit: Admit: 2021-10-04 | Discharge: 2021-10-05 | Payer: MEDICARE

## 2021-10-04 NOTE — Unmapped (Signed)
Radiation Oncology Treatment Management Note (OTV)    Encounter Date: 09/05/2021  Patient Name: Eric Good  Medical Record Number: [id number]    Diagnosis:  C61. Malignant neoplasm of the prostate    Narrative:     Mr Pricilla Holm is here to initiate radiation treatment. He will start with SBRT to the scapula followed by RT to the pelvis.    Assessment & Plan    1000 cGy of planned 3000 cGy    Tumor/Palliative response: Unable to assess   Chemotherapy/Systemic therapy:not administered  Clinical Trial:   no    Plan for Therapy: Continue treatment as planned    Therapeutic Interventions (new in bold)    None    Toxicity    None    Physical Examination    Wt Readings from Last 10 Encounters:   09/30/21 88.9 kg (196 lb)   09/23/21 88.6 kg (195 lb 6.4 oz)   09/05/21 87.3 kg (192 lb 6.4 oz)   09/01/21 86.7 kg (191 lb 3.2 oz)   08/22/21 87.6 kg (193 lb 1.6 oz)   07/26/21 86.9 kg (191 lb 9.6 oz)   06/16/21 85.7 kg (189 lb)   05/31/21 86.5 kg (190 lb 12.8 oz)   05/30/21 87 kg (191 lb 12.8 oz)   01/22/19 79.4 kg (175 lb 1.6 oz)     BP Readings from Last 1 Encounters:   09/23/21 139/67     Pulse Readings from Last 1 Encounters:   09/23/21 65     SpO2 Readings from Last 1 Encounters:   09/23/21 98%       Alert and Orientated X 3.  No acute distress.      Burlene Arnt, MD, PhD  Assistant Professor  Department of Radiation Oncology  [facility]  [address], [address]  [place], [place] [address]  O: [phone]    10/03/21 11:16 PM

## 2021-10-05 ENCOUNTER — Ambulatory Visit: Admit: 2021-10-05 | Discharge: 2021-10-06

## 2021-10-06 ENCOUNTER — Other Ambulatory Visit: Admit: 2021-10-06 | Discharge: 2021-10-06 | Payer: MEDICARE

## 2021-10-06 ENCOUNTER — Ambulatory Visit
Admit: 2021-10-06 | Discharge: 2021-10-06 | Payer: MEDICARE | Attending: Hematology & Oncology | Primary: Hematology & Oncology

## 2021-10-06 ENCOUNTER — Ambulatory Visit: Admit: 2021-10-06 | Discharge: 2021-10-07 | Payer: MEDICARE

## 2021-10-06 ENCOUNTER — Ambulatory Visit: Admit: 2021-10-06 | Discharge: 2021-10-06 | Payer: MEDICARE

## 2021-10-06 DIAGNOSIS — C61 Malignant neoplasm of prostate: Principal | ICD-10-CM

## 2021-10-06 DIAGNOSIS — I498 Other specified cardiac arrhythmias: Principal | ICD-10-CM

## 2021-10-06 DIAGNOSIS — Z09 Encounter for follow-up examination after completed treatment for conditions other than malignant neoplasm: Principal | ICD-10-CM

## 2021-10-06 LAB — HEPATIC FUNCTION PANEL
ALBUMIN: 3.8 g/dL (ref 3.4–5.0)
ALKALINE PHOSPHATASE: 55 U/L (ref 46–116)
ALT (SGPT): 70 U/L — ABNORMAL HIGH (ref 10–49)
AST (SGOT): 36 U/L — ABNORMAL HIGH (ref <=34)
BILIRUBIN DIRECT: 0.2 mg/dL (ref 0.00–0.30)
BILIRUBIN TOTAL: 0.6 mg/dL (ref 0.3–1.2)
PROTEIN TOTAL: 6.2 g/dL (ref 5.7–8.2)

## 2021-10-06 LAB — POTASSIUM: POTASSIUM: 4.3 mmol/L (ref 3.5–5.1)

## 2021-10-06 LAB — PSA: PROSTATE SPECIFIC ANTIGEN: 0.39 ng/mL (ref 0.00–4.00)

## 2021-10-06 NOTE — Unmapped (Signed)
Peripheral stick done by Marcella Dubs, 25g L H, labs drawn and sent.

## 2021-10-06 NOTE — Unmapped (Signed)
[facility] Visit Note    Patient Name: Eric Good  Patient Age: 76 y.o.  Encounter Date: 10/06/2021  Attending Provider:  Young E. 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    Eric Good is a 34 y.o. man with newly diagnosed de novo metastatic prostate cancer, T2N1M1a, involving pelvic and retroperitoneal lymph nodes on conventional CT and bone scans. PSA being 45 and Gleason 4+5=9 disease also support this diagnosis.    I discussed with the patient the long term prognosis of prostate cancer and treatment options, such as androgen deprivation therapy, and benefit and side effects of ADT.  Side effects includes but are not limited to loss of libido, erectile dysfunction, hot flashes, fatigue, weight gain, metabolic syndrome that increases the risk of diabetes and cardiovascular events, osteoporosis, and reduced muscle mass.  I have counseled the patient on the need to take calcium and vitamin D as well as regular weight bearing exercise to minimize the side effects of ADT. It is expected that virtually all patients will develop progressive disease while on ADT and at that time additional treatment would be required, but ultimately, the patient may pass away from prostate cancer.    A series of recent studies demonstrate the benefit of additional therapy (docetaxel per CHAARTED and STAMPEDE, abiraterone/prednisone per LATITUTUDE, enzalutamide per ARCHES and ENZAMET, apalutamide per TITAN).  Therefore, the current option would be to add one of AR targeted agents. Docetaxel chemo would not be recommended for low volume patients.  I discussed the consideration of additional therapy and the benefit and side effects of an agent such as abiraterone.    On 06/16/21, patient returns after PSMA-PET, which showed uptake in the prostate, multiple retrocaval and left iliac lymph nodes, and the left scapular body, concerning for metastatic disease. We discussed the implications of these findings and re-discussed the risks and benefits of ADT as well as the possible benefit from the addition of an AR-targeted therapy. Patient elected today to start treatment with ADT.    In 07/2021, PSA down on ADT. After discussion of benefit/side effects, abiraterone and prednisone prescribed. Will get somatic tumor genomic testing. Also referral to radiation oncology.    In 08/2021, PSA is 1.33. His LFT's are okay. I recommend obtaining germline testing at his next visit. I also spoke to him about joining the [employer] study. Pt was agreeable to both.    In 10/2021, PSA is 0.39. His LFT's are slightly elevated.  We will continue to monitor LFT's every 2 weeks and hope to see improvement/normalization.  Otherwise tolerating ADT, abi, and pred well. I'll see him back in mid-June for his next dose of Eligard.     He was also noted to have bradycardia on vitals.  EKG today without bradycardia but noted ventricular bigeminy/frequent PVCs.  He is asymptomatic today but did have an episode of chest pain for 20 minutes 4 nights ago.  We will have him see cardiology again.  Plan  1. Continue ADT with Eligard 45 mg subcutaneous, given last on 06/16/2021, due next on 12/15/2021.  -- Abiraterone 1000 mg every day and prednisone 5 mg daily. Follow LFT's, K, BP.  -- I do not recommend docetaxel chemo, since low volume disease.  2. Started IMRT to the prostate and pelvic nodes and SRBT to the left scapula and prostate on 09/05/2021.  3. Somatic tumor mutation profiling done, no targetable mutations  4. Germline testing negative  5. Labs q2 weeks in HBO  5. Follow-up in 2 months with Eric Good for labs, clinic visit and ADT    I personally spent 45 minutes face-to-face and non-face-to-face in the care of this patient, which includes all pre, intra, and post visit time on the date of service.  All documented time was specific to the E/M visit and does not include any procedures that may have been performed.    This patient was seen and discussed with Dr. Philomena Course.    Esperanza Richters, MD  Hematology / Oncology Fellow    Reason for Visit  Follow up of prostate cancer    History of Present Illness:  Oncology History Overview Note   In 04/2021, screening PSA of 49, then 45.3. DRE abnormal, firm at L base  In 05/2021, prostate biopsy showed Gleason 4+5=9, 6/12 cores involved. CT showed retroperitoneal and pelvic adenopathy. Bone scan showed single focus in L scapula, nonspecific.  In 06/2021, PSMA PET showed retroperitoneal/pelvic node mets, L scapular met.  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 Pt is doing well. He is exercising more without issues. Denies chest pain, light-headedness, dizziness while exercising. He notes tremendous chest pain this past Sunday. He reports that this occurred after consuming black beans, 4 hours later.  Pain over the last chest that radiated to the right.  No radiation to jaw, back, or left arm.  No dyspnea or sweating. This resolved after 20 minutes with antacid.  He also recently finished a course of Nexium. He has a PMH of extreme tachycardia secondary to severe dehydration. His appetite is the same and has improved his diet for radiation. He is tolerating abi/pred well. Increased frequency of urination and nocturia (2 times/night). He reports hot flashes that are manageable.     Allergies:  Allergies  Allergen Reactions    Flonase [Fluticasone]      Nose bleed    Budesonide      Other reaction(s): Other (See Comments)  Epistaxis       Current Medications:    Current Outpatient Medications:     abiraterone (ZYTIGA) 250 mg Tab tablet, Take 4 tablets (1,000 mg total) by mouth daily., Disp: 120 tablet, Rfl: 11    calcium carbonate 300 mg (750 mg) Chew, , Disp: , Rfl:     esomeprazole (NEXIUM) 20 MG capsule, Take 1 capsule (20 mg total) by mouth in the morning., Disp: , Rfl:     fish oil-omega-3 fatty acids 300-1,000 mg capsule, Take 2 capsules (2 g total) by mouth daily., Disp: , Rfl:     loperamide HCl (IMODIUM A-D ORAL), , Disp: , Rfl:     melatonin 3 mg Tab, Take 1 tablet (3 mg total) by mouth nightly as needed., Disp: , Rfl:     multivitamin (TAB-A-VITE/THERAGRAN) per tablet, Take 1 tablet by mouth daily., Disp: , Rfl:     naphazoline-pheniramine (NAPHCON-A) 0.025-0.3 % ophthalmic solution, Apply to eye every hour as needed., Disp: , Rfl:     olopatadine (PATADAY ONCE DAILY RELIEF) 0.7 % ophthalmic solution, , Disp: , Rfl:     predniSONE (DELTASONE) 5 MG tablet, Take 1 tablet (5 mg total) by mouth daily., Disp: 30 tablet, Rfl: 11    vitamin B complex vit C no.3 (VITAMIN B COMP AND C NO.3 ORAL), Take by mouth daily as needed. , Disp: , Rfl:     vitamin D3-folic acid 125 mcg (5,000 unit)-1 mg Tab, Take 125 mcg by mouth in the morning., Disp: , Rfl:     zinc gluconate 50 mg (7 mg elemental zinc) tablet, Take 1 tablet (50 mg total) by mouth daily., Disp: , Rfl:     Past Medical History and Social History  Past Medical History:   Diagnosis Date    Rapid heart beat     Negative cardiac workup in 2020      No past surgical history on file.     Social History     Occupational History    Not on file   Tobacco Use    Smoking status: Some Days     Types: Cigars     Start date: 12/11/2016    Smokeless tobacco: Never   Substance and Sexual Activity    Alcohol use: Not on file    Drug use: Not on file    Sexual activity: Not on file       Family History    Prostate Cancer Family History Assessment:  History of cancer in children (yes/no; if yes, what type AND age of diagnosis): No  History of cancer in siblings (yes/no; if yes, provide relation, type of cancer, AND age of diagnosis): No  History of cancer in parents (yes/no; if yes, please specify parent, type of cancer, AND age of diagnosis): Yes, sister with stomach and eye cancer, age 1  History of cancer in aunts/uncles/grandparents (yes/no; if yes, provide relation, type of cancer, AND age of diagnosis): No      Review of Systems:  A comprehensive review of 10 systems was negative except for pertinent positives noted in HPI.    Physical Exam:    VITAL SIGNS:  BP 144/72  - Pulse (S) (!) 40 Comment: HR, manually  39, asymptomatic - Temp 36.6 ??C (97.9 ??F) (Temporal)  - Resp 14  - Ht 175.3 cm (  5' 9)  - Wt 89.2 kg (196 lb 9.6 oz)  - SpO2 99%  - BMI 29.03 kg/m??   ECOG Performance Status: 1  GENERAL: Well-developed, well-nourished patient in no acute distress.  HEAD: Normocephalic and atraumatic.  EYES: Conjunctivae are normal. No scleral icterus.  MOUTH/THROAT: Oropharynx is clear and moist.  No mucosal lesions.  NECK: Supple, no thyromegaly.  LYMPHATICS: No palpable cervical, supraclavicular, or axillary adenopathy.  CARDIOVASCULAR: Normal rate, irregular rhythm, no murmurs  PULMONARY/CHEST: No auditory wheezing or stridor. No respiratory distress.  ABDOMINAL:  Soft. There is no distension. There is no tenderness. There is no rebound and no guarding.  MUSCULOSKELETAL: No clubbing, cyanosis, or lower extremity edema.  PSYCHIATRIC: Alert and oriented.  Normal mood and affect.  NEUROLOGIC: No focal motor deficit. Normal gait.  SKIN: Skin is warm, dry, and intact.      Results/Orders:      No visits with results within 2 Day(s) from this visit.   Latest known visit with results is:   Appointment on 09/26/2021   Component Date Value Ref Range Status    PSA 09/26/2021 0.70  0.00 - 4.00 ng/mL Final    Potassium 09/26/2021 3.8  3.5 - 5.1 mmol/L Final    Albumin 09/26/2021 3.9  3.4 - 5.0 g/dL Final    Total Protein 09/26/2021 6.3  5.7 - 8.2 g/dL Final    Total Bilirubin 09/26/2021 1.1  0.3 - 1.2 mg/dL Final    Bilirubin, Direct 09/26/2021 0.40 (H)  0.00 - 0.30 mg/dL Final    AST 16/04/9603 36 (H)  <=34 U/L Final    ALT 09/26/2021 57 (H)  10 - 49 U/L Final    Alkaline Phosphatase 09/26/2021 58  46 - 116 U/L Final     Lab Results   Component Value Date    PSA 0.70 09/26/2021    PSA 1.06 09/12/2021    PSA 1.33 09/01/2021    PSA 1.72 08/26/2021    PSA 4.70 (H) 07/26/2021    PSA 42.86 (H) 06/16/2021         Orders placed or performed in visit on 10/06/21    Clinic Appointment Request Physician           Molecular Pathology  Tumor mutation profiling on 08/01/18  TMB 0 m/Mb  MSS  APC  MTA  PTEN  CDKN2A/B  CIC  TP53    Germline testing [facility] on 09/12/21  Negative    Pathology  05/04/2021  [A] PROSTATE, LEFT BASE:   ACINAR ADENOCARCINOMA, GLEASON 4+3=7  (GG   3), INVOLVING 1 OF 1 CORES, MEASURING 5  MM ( 71%). 80% PATTERN 4; CRIBRIFORM PATTERN 4 PRESENT    [B] PROSTATE, LEFT MID:   ACINAR ADENOCARCINOMA, GLEASON 4+3=7  (GG 3),   INVOLVING 1 OF 1 CORES, MEASURING 5  MM ( 83%). 80% PATTERN 4;   CRIBRIFORM PATTERN 4 PRESENT    [C] PROSTATE, LEFT APEX:   ACINAR ADENOCARCINOMA, GLEASON 4+3=7  (GG   3), INVOLVING 1 OF 2 CORES, MEASURING 4  MM ( 40%). 70% PATTERN 4;   CRIBRIFORM PATTERN 4 PRESENT    [D] PROSTATE, RIGHT BASE:   NEGATIVE FOR MALIGNANCY.    [E] PROSTATE, RIGHT MID:   NEGATIVE FOR MALIGNANCY.    [F] PROSTATE, RIGHT APEX:   NEGATIVE FOR MALIGNANCY.    [G] PROSTATE, LEFT LATERAL BASE:   ACINAR ADENOCARCINOMA, GLEASON 4+4=8   (GG 4), INVOLVING 1 OF 1 CORES, MEASURING 5  MM ( 50%). PERINEURAL  INVASION PRESENT    [H] PROSTATE, LEFT LATERAL MID:   ACINAR ADENOCARCINOMA, GLEASON 4+5=9   (GG 5), INVOLVING 1 OF 1 CORES, MEASURING 9  MM ( 100%). CRIBRIFORM   PATTERN 4 PRESENT    [I] PROSTATE, LEFT LATERAL APEX:   ACINAR ADENOCARCINOMA, GLEASON 4+3=7   (GG 3), INVOLVING 2 OF 2 CORES, MEASURING 8  MM ( 100%). 80% PATTERN 4;   CRIBRIFORM PATTERN 4 PRESENT; PERINEURAL INVASION PRESENT    [J] PROSTATE, RIGHT LATERAL BASE:   NEGATIVE FOR MALIGNANCY.    [K] PROSTATE, RIGHT LATERAL MID:   NEGATIVE FOR MALIGNANCY.    [L] PROSTATE, RIGHT LATERAL APEX:   NEGATIVE FOR MALIGNANCY.     Imaging results:  CT AP 05/19/2021  LYMPH NODES: Retroperitoneal and pelvic adenopathy with index lesions as follows  --1.5 cm retrocaval lymph node  -- 1.4 cm left common iliac lymph node.  --1.9 cm left external iliac lymph node  --1.8 cm left internal iliac lymph  node  IMPRESSION:  Retroperitoneal and  left pelvic adenopathy, suspicious for metastatic disease.     Heterogeneous appearance of the prostate, consistent with known neoplasm.     Indeterminate lesion in the right posterior 12th rib. Recommend correlation with concurrent bone scan.     Mildly hyperenhancing right renal lesion, suspicious for cortical neoplasm. Recommend correlation with prior imaging if available. If not, MRI of the abdomen with and without contrast may be performed for definitive characterization.    Bone scan 05/19/2021  Focal area of increased radiotracer uptake within the left scapula which could represent osseous metastasis or remote trauma, indeterminate      PET CT Skull Base to Thigh 06/13/2021  -Mild radiotracer uptake within the prostate, likely corresponding to patient's known neoplasm.   -Multiple radiotracer avid retrocaval and left iliac lymph nodes, as described above, concerning for metastatic disease. Tiny right iliac/pelvic sidewall lymph nodes with no significant uptake, indeterminate.   -Radiotracer uptake within the left scapular body, corresponding to increased tracer uptake on recent bone scan, concerning for osseous metastatic disease.      I attest that I, Barrington Ellison, personally documented this note while acting as scribe for Maurie Boettcher, MD.      Barrington Ellison, Scribe.  10/06/2021   ______________________________________________      Documentation assistance provided by Medical Scribe, Barrington Ellison, who was present during the entirety of the visit. I reviewed the note below and validated all of the information provided to ensure accuracy and completeness.     Esperanza Richters, MD  Hematology/Oncology Fellow  ------------------------------------------------------------

## 2021-10-06 NOTE — Unmapped (Addendum)
Lab Results   Component Value Date    PSA 0.39 10/06/2021    PSA 0.70 09/26/2021    PSA 1.06 09/12/2021    PSA 1.33 09/01/2021    PSA 1.72 08/26/2021    PSA 4.70 (H) 07/26/2021   It was a pleasure meeting with you today. Given your low heart rate today and recent chest pain, we will obtain an EKG. Please get your labs done in 2 weeks so we can continue to monitor your liver function. This can be done at the [facility] clinic. We'll see you back in mid-June for a clinic visit and your next Eligard injection. Please call [phone] to reach my nurse navigator Mauricia Area for any issues.    For emergencies on Nights, Weekends and Holidays  Call [phone] for help.      Griffin Basil, MD, PhD  Associate Professor of Medicine  Division of Hematology-Oncology    [facility]  Genitourinary Oncology Clinic  Nurse Navigator: Mauricia Area  Fax: [phone]

## 2021-10-07 ENCOUNTER — Ambulatory Visit: Admit: 2021-10-07 | Discharge: 2021-10-08 | Payer: MEDICARE

## 2021-10-07 MED FILL — PREDNISONE 5 MG TABLET: ORAL | 30 days supply | Qty: 30 | Fill #2

## 2021-10-07 MED FILL — ABIRATERONE 250 MG TABLET: ORAL | 30 days supply | Qty: 120 | Fill #2

## 2021-10-07 NOTE — Unmapped (Signed)
10/07/2021      Subjective/Assessment/Recommendations:  [name] to Radiation Oncology  clinic for Status check with  secondary malignant neoplasm of bone.    1. Fatigue: 4/10   2. Pain: 0  3. Elimination: NO problem but urinating a little more  4. Prescription Needs:  NO  5. Psychosocial: NO    Wt Readings from Last 6 Encounters:   10/07/21 90.1 kg (198 lb 9.6 oz)   10/06/21 89.2 kg (196 lb 9.6 oz)   09/30/21 88.9 kg (196 lb)   09/23/21 88.6 kg (195 lb 6.4 oz)   09/05/21 87.3 kg (192 lb 6.4 oz)   09/01/21 86.7 kg (191 lb 3.2 oz)

## 2021-10-07 NOTE — Unmapped (Signed)
Radiation Treatment Management Note  Patient: Eric Good  Visit Date: 10/07/2021    Diagnosis:  Prostate cancer    ASSESSMENT:   Eric Good is a 76 y.o. M with high risk prostate cancer, cT1c, prostate biopsy 05/04/21 showed Gleason 4+5=9 disease, 100% total cores involved, PSA (04/15/21) 49.44, and CT A/P showing enlarged left pelvic and left periaortic lymph nodes concerning for metastatic disease.     Summary of Radiotherapy  3500cGy of 7000cGy planned. ADT: yes, per medical oncology    PLAN  Prostate cancer: Continue radiation treatment as planned  Symptoms management: We will monitor for acute side effects. Continue imodium prn. Not interested in flomax at this time.   Follow-up: We will reassess the patient next week  -------------------------------------------    Subjective:   Notes some frequency, urgency and 1x nocturia as well as some looser stools. Diarrhea responding to imodium. Not interested in flomax. Doing well otherwise    Current Signs/Symptoms:  General:  Overall he is not maintaining energy  Urinary: with changes in urinary symptoms  GI: with changes in bowel symptoms    Baseline Signs/Symptoms:   General:??Endorses being fully functional for all ADLs and IADLs  Urinary:??Denies dysuria urgency frequency nocturia incontinence  Sexual Function: is not sexually active.   GI: Denies loose stools, rectal pain, or bleeding.  Inflammatory bowel disease: No  Anticoagulation: No    Physical Examination:  BP 139/64  - Pulse 72  - Temp 36.4 ??C (97.6 ??F) (Temporal)  - Resp 16  - Ht 175.3 cm (5' 9.02)  - Wt 90.1 kg (198 lb 9.6 oz)  - SpO2 100%  - BMI 29.31 kg/m??    Karnofsky/Lansky Performance Status:  100, Fully active, able to carry on all pre-disease performed without restriction (ECOG equivalent 0)  General: Well-appearing, no acute distress  HEENT: Normocephalic, moist mucous membranes  Respiratory: Breathing comfortably on room air    Elita Quick, MD, MS  Radiation Oncology PGY-3  10/07/2021 12:39 PM

## 2021-10-10 ENCOUNTER — Ambulatory Visit: Admit: 2021-10-10 | Discharge: 2021-10-11 | Payer: MEDICARE

## 2021-10-11 ENCOUNTER — Ambulatory Visit: Admit: 2021-10-11 | Discharge: 2021-10-12 | Payer: MEDICARE

## 2021-10-12 ENCOUNTER — Ambulatory Visit: Admit: 2021-10-12 | Discharge: 2021-10-13

## 2021-10-13 ENCOUNTER — Ambulatory Visit: Admit: 2021-10-13 | Discharge: 2021-10-14 | Payer: MEDICARE

## 2021-10-14 ENCOUNTER — Ambulatory Visit: Admit: 2021-10-14 | Discharge: 2021-10-15

## 2021-10-14 MED ORDER — TAMSULOSIN 0.4 MG CAPSULE
ORAL_CAPSULE | Freq: Every day | ORAL | 0 refills | 30 days | Status: CP
Start: 2021-10-14 — End: 2021-11-13

## 2021-10-17 ENCOUNTER — Ambulatory Visit: Admit: 2021-10-17 | Discharge: 2021-10-18 | Payer: MEDICARE

## 2021-10-17 LAB — HEPATIC FUNCTION PANEL
ALBUMIN: 3.8 g/dL (ref 3.4–5.0)
ALKALINE PHOSPHATASE: 61 U/L (ref 46–116)
ALT (SGPT): 64 U/L — ABNORMAL HIGH (ref 10–49)
AST (SGOT): 35 U/L — ABNORMAL HIGH (ref <=34)
BILIRUBIN DIRECT: 0.3 mg/dL (ref 0.00–0.30)
BILIRUBIN TOTAL: 0.8 mg/dL (ref 0.3–1.2)
PROTEIN TOTAL: 5.9 g/dL (ref 5.7–8.2)

## 2021-10-17 LAB — PSA: PROSTATE SPECIFIC ANTIGEN: 0.25 ng/mL (ref 0.00–4.00)

## 2021-10-17 LAB — POTASSIUM: POTASSIUM: 4.1 mmol/L (ref 3.5–5.1)

## 2021-10-17 NOTE — Unmapped (Signed)
Radiation Oncology Treatment Management Note (OTV)    Encounter Date: 09/23/2021  Patient Name: Eric Good  Medical Record Number: [id number]    Diagnosis:  Prostate cancer    Narrative: 76 year old man with very high risk prostate cancer     [name] reports having no symptoms.    Assessment & Plan    We discussed that these symptoms are expected given the treated disease, and we will continue supportive measures until they complete radiotherapy.    1000 cGy of planned 7000 cGy    Tumor/Palliative response: Unable to assess   Chemotherapy/Systemic therapy:not administered  Clinical Trial:   no    Plan for Therapy: Continue treatment as planned    Therapeutic Interventions (new in bold)    None    Toxicity    None    Physical Examination    Wt Readings from Last 10 Encounters:   10/14/21 88.7 kg (195 lb 8 oz)   10/07/21 90.1 kg (198 lb 9.6 oz)   10/06/21 89.2 kg (196 lb 9.6 oz)   09/30/21 88.9 kg (196 lb)   09/23/21 88.6 kg (195 lb 6.4 oz)   09/05/21 87.3 kg (192 lb 6.4 oz)   09/01/21 86.7 kg (191 lb 3.2 oz)   08/22/21 87.6 kg (193 lb 1.6 oz)   07/26/21 86.9 kg (191 lb 9.6 oz)   06/16/21 85.7 kg (189 lb)     BP Readings from Last 1 Encounters:   10/14/21 134/65     Pulse Readings from Last 1 Encounters:   10/07/21 72     SpO2 Readings from Last 1 Encounters:   10/14/21 98%       Alert and Orientated X 3.  No acute distress.      Burlene Arnt, MD, PhD  Assistant Professor  Department of Radiation Oncology  [facility]  [address], [id number]  [place], [place] [address]  O: [phone]    10/16/21 10:59 PM

## 2021-10-18 ENCOUNTER — Ambulatory Visit: Admit: 2021-10-18 | Discharge: 2021-10-19

## 2021-10-19 ENCOUNTER — Ambulatory Visit: Admit: 2021-10-19 | Discharge: 2021-10-20 | Payer: MEDICARE

## 2021-10-20 ENCOUNTER — Ambulatory Visit: Admit: 2021-10-20 | Discharge: 2021-10-21 | Payer: MEDICARE

## 2021-10-21 ENCOUNTER — Ambulatory Visit: Admit: 2021-10-21 | Discharge: 2021-10-22 | Payer: MEDICARE

## 2021-10-21 NOTE — Unmapped (Signed)
10/21/2021    Dose Site Summary:    Rx:Lt Scapula: 09/07/2021: 3,000/3,000 cGy  [id number]: 10/21/2021: 6,000/7,000 cGy    Subjective/Assessment/Recommendations:    1. Fatigue: Moderate  2. Pain: Denies pain  3. Urinary Elimination:  slow stream, voids once at night and 5-8 times during the day,occ pain/burning and leaking, frequent urgency  4. Bowel Elimination:  occ mucous  5. Prescription Needs: None  6. Psychosocial: No issues  7. Screening form: Completed  8. Other: discharge teaching completed     Wt Readings from Last 6 Encounters:   10/21/21 88.4 kg (194 lb 14.4 oz)   10/14/21 88.7 kg (195 lb 8 oz)   10/07/21 90.1 kg (198 lb 9.6 oz)   10/06/21 89.2 kg (196 lb 9.6 oz)   09/30/21 88.9 kg (196 lb)   09/23/21 88.6 kg (195 lb 6.4 oz)

## 2021-10-24 ENCOUNTER — Ambulatory Visit: Admit: 2021-10-24 | Discharge: 2021-10-25 | Payer: MEDICARE

## 2021-10-24 LAB — HEPATIC FUNCTION PANEL
ALBUMIN: 3.8 g/dL (ref 3.4–5.0)
ALKALINE PHOSPHATASE: 56 U/L (ref 46–116)
ALT (SGPT): 129 U/L — ABNORMAL HIGH (ref 10–49)
AST (SGOT): 63 U/L — ABNORMAL HIGH (ref <=34)
BILIRUBIN DIRECT: 0.3 mg/dL (ref 0.00–0.30)
BILIRUBIN TOTAL: 0.9 mg/dL (ref 0.3–1.2)
PROTEIN TOTAL: 6.4 g/dL (ref 5.7–8.2)

## 2021-10-24 LAB — PSA: PROSTATE SPECIFIC ANTIGEN: 0.23 ng/mL (ref 0.00–4.00)

## 2021-10-24 LAB — POTASSIUM: POTASSIUM: 4.2 mmol/L (ref 3.5–5.1)

## 2021-10-24 NOTE — Unmapped (Signed)
DIVISION OF CARDIOLOGY   [facility] [place]        Date of Service: 10/25/2021    Assessment/Plan   1. Ventricular bigeminy  Ziopatch showed 6.5% PVC burden. Echo overall structurally normal. Underwent nuclear stress test that was negative for ischemia. Given burden <10%, shared decision made to defer medication therapy. No PVCs on exam today.   - Ambulatory referral to Cardiology    2. Chest pain, unspecified type  Patient had one severe episode of chest pain after eating which resolved with antacid. Echo in 2020 overall normal. Underwent nuclear stress test at the time that was negative for ischemia. He was under significant stress and his wife recently passed when this was performed. Patient plans to start exercising for the next month then have his lipid panel rechecked. Could consider repeat stress testing if symptoms continue but will defer for now since he is tolerating exercise well.   - LDL Cholesterol, Direct; Future  - Lipid Panel; Future    3. Chest pressure  Patient reports episodes of chest pressure with exercise and at rest. Symptoms improve when he continues exercising. These symptoms could be due to PVCs. Ziopatch showed 6.5% PVC burden. Patient plans to increase exercise as mentioned above.     Return to clinic:  Return in about 3 months (around 01/24/2022).    I personally spent 40 minutes face-to-face and non-face-to-face in the care of this patient, which includes all pre, intra, and post visit time on the date of service.     Subjective:   JYN:WGNF Augusto Gamble, MD  Chief complaint:  76 y.o. male with a history of HLD, GERD, nonsustained VT, and PVC is referred by Dr. Griffin Basil for evaluation of chest pain.    History of Present Illness:  Patient was hospitalized at [facility] last month for presyncope and was found to have numerous presystoles with short runs of NSVT on telemetry but was not symptomatic when these occurred. Echo overall structurally normal. Underwent nuclear stress test that was negative for ischemia. He was discharged with Ziopatch which showed 6.5% PVC burden. Patient was last seen in clinic by Phineas Inches, NP on 01/22/19 reporting occasional skipped beats. Recommended regular exercise and limiting caffeine. Patient was recently seen by heme onc on 10/06/21 reporting an episode of left sided chest pain which occurred 4 hours after consuming black beans. Resolved after 20 minutes with antacid.  PVCs/ventricular bigeminy noted on EKG. Referred to cardiology for evaluation.     Patient presents today regarding several episodes of chest pain. His episodes are not always associated with eating. He had one long episode of sharp, squeezing chest pain. This improved after taking tums. No improvement with nexium. No reflux at the time. Patient has also experienced smaller chest pressure episodes with exercise or rest. His symptoms improve if he continues to exercise when they occur. He is currently being treated for prostate cancer with radiation and hormone therapy. Patient has 5.5 more weeks of radiation therapy, last treatment was today. He will be on hormone therapy for another year. Patient was experiencing PVCs and several family member passed away leading up to his stress test in 01-Jan-2019. His wife passed away in July 02, 2018; she had dementia and he was primary caregiver for her. Drinks 1 cup coffee/day. Occasional ETOH. Smokes several cigars a year. 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      fish oil-omega-3 fatty acids 300-1,000 mg capsule Take 2 capsules (2 g total) by mouth daily.      melatonin 3 mg Tab Take 1 tablet (3 mg total) by mouth nightly as needed.      multivitamin (TAB-A-VITE/THERAGRAN) per tablet Take 1 tablet by mouth daily.      tamsulosin (FLOMAX) 0.4 mg capsule Take 1 capsule (0.4 mg total) by mouth daily. 30 capsule 0    vitamin B complex vit C no.3 (VITAMIN B COMP AND C NO.3 ORAL) Take by mouth daily as needed.       vitamin D3-folic acid 125 mcg (5,000 unit)-1 mg Tab Take 125 mcg by mouth in the morning.      zinc gluconate 50 mg (7 mg elemental zinc) tablet Take 1 tablet (50 mg total) by mouth daily.      abiraterone (ZYTIGA) 250 mg Tab tablet Take 4 tablets (1,000 mg total) by mouth daily. (Patient not taking: Reported on 10/25/2021) 120 tablet 11    esomeprazole (NEXIUM) 20 MG capsule Take 1 capsule (20 mg total) by mouth in the morning. (Patient not taking: Reported on 10/07/2021)      loperamide HCl (IMODIUM A-D ORAL)  (Patient not taking: Reported on 10/14/2021)      naphazoline-pheniramine (NAPHCON-A) 0.025-0.3 % ophthalmic solution Apply to eye every hour as needed. (Patient not taking: Reported on 10/07/2021)      olopatadine (PATADAY ONCE DAILY RELIEF) 0.7 % ophthalmic solution Administer 1 drop to both eyes daily. (Patient not taking: Reported on 10/21/2021)      predniSONE (DELTASONE) 5 MG tablet Take 1 tablet (5 mg total) by mouth daily. (Patient not taking: Reported on 10/25/2021) 30 tablet 11     No current facility-administered medications for this visit.       Allergies  Allergies   Allergen Reactions    Flonase [Fluticasone]      Nose bleed    Budesonide      Other reaction(s): Other (See Comments)  Epistaxis       Social History:   Social History     Tobacco Use    Smoking status: Some Days     Types: Cigars Start date: 12/11/2016    Smokeless tobacco: Never       Family History:  No family history on file.    ROS- 12 system review is negative other than what is specified in the History of Present Illness.      Objective:   Physical Exam  Vitals:    10/25/21 1525   BP: 127/73   Pulse: 72   Resp: 20   Temp: 36.7 ??C (98.1 ??F)   SpO2: 98%   Weight: 88.7 kg (195 lb 8 oz)   Height: 172.7 cm (5' 8)       Wt Readings from Last 3 Encounters:  10/25/21 88.7 kg (195 lb 8 oz)   10/21/21 88.4 kg (194 lb 14.4 oz)   10/14/21 88.7 kg (195 lb 8 oz)     General-  Patient is well-appearing in no acute distress  Neurologic- Alert and oriented X3.  Cranial nerve II-XII grossly intact.  HEENT-  Normocephalic atraumatic head.  No scleral icterus.  Wearing face mask.  Neck- Supple, no carotid bruis, no JVD  Lungs- clear to auscultation, no wheezes, rhonchi, or rhales.  Heart-  RRR, no MRG.   Abdomen- Soft, nontender, no organomegally.  Extremities-  No clubbing or cyanosis.  No lower extremity edema bilaterally.    Pulses- +2 pulses in radial and dorsalis pedis bilaterally.  Psych- Normal mood, appropriate.      Laboratory data:      Component Value Date/Time    PROBNP 298.0 (H) 12/12/2018 1632       Lab Results   Component Value Date    WBC 6.7 06/16/2021    HGB 15.6 06/16/2021    HCT 46.4 06/16/2021    PLT 193 06/16/2021       Lab Results   Component Value Date    NA 139 06/16/2021    K 4.2 10/24/2021    CL 103 06/16/2021    CO2 28.0 06/16/2021    BUN 22 06/16/2021    CREATININE 1.1 08/22/2021    GLU 89 06/16/2021    CALCIUM 10.2 06/16/2021    MG 1.9 12/12/2018    PHOS 3.2 12/12/2018       Lab Results   Component Value Date    TSH 3.920 (H) 12/12/2018    ALBUMIN 3.8 10/24/2021    ALT 129 (H) 10/24/2021    AST 63 (H) 10/24/2021         Electrocardiogram:  From 12/12/18 showed sinus tach w/ occasional PVC.    From 10/06/21 showed SR with frequent PVC in pattern of bigeminy, possible inferior infarct.     Ziopatch   Ambulatory ECG monitoring was performed from 12/13/18 to 12/27/18. The predominant rhythm was sinus rhythm, with the rate ranging from 44 to 141 and averaging 74 bpm when in sinus rhythm. Rare supraventricular ectopics (PACs) were recorded with 24 episodes of SVT (at least some of which appear to be atrial tachycardia), longest lasting 11.5 seconds at 121 bpm. Frequent ventricular ectopics (PVCs) were recorded (6.5% burden of isolated PVCs) with 11 episodes of wide-complex tachycardia, longest lasting 7 beats with rate 118 bpm. Patient-initiated recordings/events revealed sinus rhythm, PACs, PVCs (including bigeminy and trigeminy), and artifact. No pauses >3 seconds or high-grade AV block detected. No atrial fibrillation detected.    Echocardiogram:  From 12/12/18 showed overall normal left ventricular systolic function, ejection fraction 50 - 55%. Possible inferolateral hypokinesis, wall motion assessment is limited by study quality. Normal right ventricular systolic function. Tricuspid regurgitation - mild. Mitral valve prolapse - mild.    Nuclear stress test:  From 01/17/19 showed normal myocardial perfusion study. No evidence for significant ischemia or scar is noted.  Post stress: Global systolic function is normal. The ejection fraction calculated at 59%. Left ventricular chamber size is relatively small. No significant coronary calcifications were noted on the attenuation CT.    Lipid panel:  From 04/15/21 at [facility] showed:  Triglycerides- 75  Cholesterol- 190  HDL- 59.8  LDL- 115    Documentation assistance was provided by Halina Maidens, Scribe on October 24, 2021 at 11:48 AM for Joneen Roach, MD.     I have reviewed  the documentation provided by the scribe and confirm that it accurately reflects the service I personally performed and the decisions made by me.  Signature: CSD  Date: 10/25/2021  Time: 4:13 PM

## 2021-10-25 ENCOUNTER — Ambulatory Visit: Admit: 2021-10-25 | Discharge: 2021-10-26 | Payer: MEDICARE

## 2021-10-25 DIAGNOSIS — R0789 Other chest pain: Principal | ICD-10-CM

## 2021-10-25 DIAGNOSIS — I498 Other specified cardiac arrhythmias: Principal | ICD-10-CM

## 2021-10-25 DIAGNOSIS — R079 Chest pain, unspecified: Principal | ICD-10-CM

## 2021-10-25 NOTE — Unmapped (Unsigned)
Pt is here for PVC'S in 2020.  He has metastic Prostrate Cancer receiving radiation.  He was noted to have sinus brady in 40's in ER for syncopal episode and was referred to Cardiology.  His only complaint is occasional light headedness.

## 2021-10-26 ENCOUNTER — Ambulatory Visit: Admit: 2021-10-26 | Discharge: 2021-10-27 | Payer: MEDICARE

## 2021-10-27 ENCOUNTER — Ambulatory Visit: Admit: 2021-10-27 | Discharge: 2021-10-28 | Payer: MEDICARE

## 2021-10-28 NOTE — Unmapped (Signed)
[facility] Refill Coordination Note    Specialty Medication(s) to be Shipped:   Hematology/Oncology: abiraterone 250mg  and Transplant: Prednisone 5mg  ((DENIED))    Other medication(s) to be shipped: No additional medications requested for fill at this time     Eric Good, DOB: [birth year]  Phone: [phone] (home)       All above HIPAA information was verified with patient.     Was a Nurse, learning disability used for this call? No    Completed refill call assessment today to schedule patient's medication shipment from the [facility] [phone]).  All relevant notes have been reviewed.     Specialty medication(s) and dose(s) confirmed: Regimen is correct and unchanged.   Changes to medications: Eric Good reports no changes at this time.  Changes to insurance: No  New side effects reported not previously addressed with a pharmacist or physician: None reported  Questions for the pharmacist: No    Confirmed patient received a Conservation officer, historic buildings and a Surveyor, mining with first shipment. The patient will receive a drug information handout for each medication shipped and additional FDA Medication Guides as required.       DISEASE/MEDICATION-SPECIFIC INFORMATION        N/A    SPECIALTY MEDICATION ADHERENCE     Medication Adherence    Patient reported X missed doses in the last month: 0  Specialty Medication: Abiraterone 250 mg  Patient is on additional specialty medications: Yes  Additional Specialty Medications: Prednisone 5mg   Patient Reported Additional Medication X Missed Doses in the Last Month: 0  Patient is on more than two specialty medications: No  Informant: patient              Were doses missed due to medication being on hold? Yes - Currently off of medications due to liver issues.    Abiraterone 250 mg: 18 days of medicine on hand   Prednisone 5 mg: 18 days of medicine on hand       REFERRAL TO PHARMACIST     Referral to the pharmacist: Not needed      SHIPPING Shipping address confirmed in Epic.     Delivery Scheduled: Patient declined refill at this time due to currently off of medications.     Medication will be delivered via Next Day Courier to the prescription address in Epic [place].    Eric Good   [facility] Specialty Technician

## 2021-10-31 ENCOUNTER — Ambulatory Visit: Admit: 2021-10-31 | Discharge: 2021-11-01 | Payer: MEDICARE

## 2021-10-31 LAB — HEPATIC FUNCTION PANEL
ALBUMIN: 3.7 g/dL (ref 3.4–5.0)
ALKALINE PHOSPHATASE: 57 U/L (ref 46–116)
ALT (SGPT): 75 U/L — ABNORMAL HIGH (ref 10–49)
AST (SGOT): 35 U/L — ABNORMAL HIGH (ref <=34)
BILIRUBIN DIRECT: 0.2 mg/dL (ref 0.00–0.30)
BILIRUBIN TOTAL: 0.6 mg/dL (ref 0.3–1.2)
PROTEIN TOTAL: 6.1 g/dL (ref 5.7–8.2)

## 2021-10-31 LAB — PSA: PROSTATE SPECIFIC ANTIGEN: 0.19 ng/mL (ref 0.00–4.00)

## 2021-10-31 LAB — POTASSIUM: POTASSIUM: 4.4 mmol/L (ref 3.5–5.1)

## 2021-11-07 ENCOUNTER — Ambulatory Visit: Admit: 2021-11-07 | Discharge: 2021-11-08 | Payer: MEDICARE

## 2021-11-07 LAB — PSA: PROSTATE SPECIFIC ANTIGEN: 0.21 ng/mL (ref 0.00–4.00)

## 2021-11-07 LAB — HEPATIC FUNCTION PANEL
ALBUMIN: 4 g/dL (ref 3.4–5.0)
ALKALINE PHOSPHATASE: 66 U/L (ref 46–116)
ALT (SGPT): 51 U/L — ABNORMAL HIGH (ref 10–49)
AST (SGOT): 39 U/L — ABNORMAL HIGH (ref <=34)
BILIRUBIN DIRECT: 0.2 mg/dL (ref 0.00–0.30)
BILIRUBIN TOTAL: 0.7 mg/dL (ref 0.3–1.2)
PROTEIN TOTAL: 6.5 g/dL (ref 5.7–8.2)

## 2021-11-07 LAB — POTASSIUM: POTASSIUM: 4.3 mmol/L (ref 3.5–5.1)

## 2021-11-07 NOTE — Unmapped (Incomplete)
Clinical Pharmacist Practitioner: [facility]    Patient Name: Eric Good  Patient Age: 76 y.o.  HPI: Eric Good is a 76 y.o. M with newly diagnosed metastatic HSPC     I called Eric Good  today for medication management.     Telephone Encounter:    S/O:  [name] recently started on abiteraterone 1000 mg daily (start date: 10/2021). Approximately 3 weeks after starting therapy he developed transaminitis and his abiraterone and prednisone therapy were held beginning 10/24/21. He has been off abieraterone therapy since that period with improvement in his LFTs. On labs today, his LFTs have continued to downtrend, though remain slightly elevated above baseline    A/P:  - Per discussion with Eric Good, will restart abiraterone at dose of 750 mg daily. Will also restart prednisone 5 mg daily.   - Will monitor LFTs every 2 weeks  - If patient continues to have LFT elevations, can consider transition to darolutamide    Follow- up: RTC on 12/15/21 for clinic visit     ______________________________________________________________________    Current cancer therapy: Eligard, last given 06/16/21, next due 12/15/21    Medications:  Current Outpatient Medications   Medication Sig Dispense Refill    abiraterone (ZYTIGA) 250 mg Tab tablet Take 4 tablets (1,000 mg total) by mouth daily. (Patient not taking: Reported on 10/25/2021) 120 tablet 11    calcium carbonate 300 mg (750 mg) Chew       cholecalciferol, vitamin D3-125 mcg, 5,000 unit,, 125 mcg (5,000 unit) tablet Take 1 tablet (125 mcg total) by mouth two (2) times a day.      esomeprazole (NEXIUM) 20 MG capsule Take 1 capsule (20 mg total) by mouth in the morning. (Patient not taking: Reported on 10/07/2021)      fish oil-omega-3 fatty acids 300-1,000 mg capsule Take 2 capsules (2 g total) by mouth daily.      loperamide HCl (IMODIUM A-D ORAL)  (Patient not taking: Reported on 10/14/2021)      melatonin 3 mg Tab Take 1 tablet (3 mg total) by mouth nightly as needed.      multivitamin (TAB-A-VITE/THERAGRAN) per tablet Take 1 tablet by mouth daily.      naphazoline-pheniramine (NAPHCON-A) 0.025-0.3 % ophthalmic solution Apply to eye every hour as needed. (Patient not taking: Reported on 10/07/2021)      olopatadine (PATADAY ONCE DAILY RELIEF) 0.7 % ophthalmic solution Administer 1 drop to both eyes daily. (Patient not taking: Reported on 10/21/2021)      predniSONE (DELTASONE) 5 MG tablet Take 1 tablet (5 mg total) by mouth daily. (Patient not taking: Reported on 10/25/2021) 30 tablet 11    tamsulosin (FLOMAX) 0.4 mg capsule Take 1 capsule (0.4 mg total) by mouth daily. 30 capsule 0    vitamin B complex vit C no.3 (VITAMIN B COMP AND C NO.3 ORAL) Take by mouth daily as needed.       vitamin D3-folic acid 125 mcg (5,000 unit)-1 mg Tab Take 125 mcg by mouth in the morning.      zinc gluconate 50 mg (7 mg elemental zinc) tablet Take 1 tablet (50 mg total) by mouth daily.       No current facility-administered medications for this visit.       Pertinent Labs:   AST   Date Value Ref Range Status   11/07/2021 39 (H) <=34 U/L Final   10/31/2021 35 (H) <=34 U/L Final   10/24/2021 63 (  H) <=34 U/L Final     ALT   Date Value Ref Range Status   11/07/2021 51 (H) 10 - 49 U/L Final   10/31/2021 75 (H) 10 - 49 U/L Final   10/24/2021 129 (H) 10 - 49 U/L Final       I spent 10 minutes in direct patient care.    Elza Rafter D  PGY-2 Oncology Resident

## 2021-11-16 NOTE — Unmapped (Signed)
RADIATION ONCOLOGY TREATMENT COMPLETION NOTE    Encounter Date: 11/16/2021  Patient Name: Eric Good  Medical Record Number: [id number]    Referring Physician: No referring provider defined for this encounter.    Primary Care Provider: Gracelyn Nurse, MD    DIAGNOSIS:  Metastatic prostate cancer    TREATMENT INTENT: palliative    CLINICAL TRIAL: no    CHEMOTHERAPY: not administered    RADIATION TREATMENT SUMMARY:          Treatment site Treatment Technique/Modality Energy Dose per fraction Total number  of fractions Total dose Start date End date   Prostate and gross pelvic LNs IMRT  6 MV 250 cGy 28 7000 cGy 09/20/21   10/27/21   pevlic LNs IMRT  6 MV 180 cGy 28 5040 cGy 09/20/21 10/27/21   Left scapula SBRT 6 MV 1000 cGy 3 3000 c Gy 09/05/21 09/07/21       COMPLETED INTENDED COURSE:  yes    TREATMENT BREAK > 2 WEEKS:  no    TOLERANCE TO TREATMENT:  No toxicities or complications    PLAN FOR FOLLOW-UP: Javier Docker is to return for follow up with me/our group in 2 month(s) \    N. Alonna Buckler, MD, PhD  Assistant Professor  Department of Radiation Oncology  [facility]  [address], [id number]  [place], [place] [address]  O: [phone]  11/16/21 2:01 PM

## 2021-11-21 ENCOUNTER — Ambulatory Visit: Admit: 2021-11-21 | Discharge: 2021-11-22 | Payer: MEDICARE

## 2021-11-21 LAB — POTASSIUM: POTASSIUM: 4.3 mmol/L (ref 3.5–5.1)

## 2021-11-21 LAB — HEPATIC FUNCTION PANEL
ALBUMIN: 4.1 g/dL (ref 3.4–5.0)
ALKALINE PHOSPHATASE: 63 U/L (ref 46–116)
ALT (SGPT): 68 U/L — ABNORMAL HIGH (ref 10–49)
AST (SGOT): 40 U/L — ABNORMAL HIGH (ref <=34)
BILIRUBIN DIRECT: 0.2 mg/dL (ref 0.00–0.30)
BILIRUBIN TOTAL: 0.7 mg/dL (ref 0.3–1.2)
PROTEIN TOTAL: 6.5 g/dL (ref 5.7–8.2)

## 2021-11-21 LAB — PSA: PROSTATE SPECIFIC ANTIGEN: 0.15 ng/mL (ref 0.00–4.00)

## 2021-11-21 LAB — LIPID PANEL
CHOLESTEROL/HDL RATIO SCREEN: 3 (ref 1.0–4.5)
CHOLESTEROL: 158 mg/dL (ref <=200)
HDL CHOLESTEROL: 53 mg/dL (ref 40–60)
LDL CHOLESTEROL CALCULATED: 74 mg/dL (ref 40–99)
NON-HDL CHOLESTEROL: 105 mg/dL (ref 70–130)
TRIGLYCERIDES: 157 mg/dL — ABNORMAL HIGH (ref 0–150)
VLDL CHOLESTEROL CAL: 31.4 mg/dL (ref 12–42)

## 2021-11-21 LAB — LDL CHOLESTEROL, DIRECT: LDL CHOLESTEROL DIRECT: 106 mg/dL

## 2021-11-22 NOTE — Unmapped (Signed)
Radiation Oncology Treatment Management Note (OTV)  ??  Encounter Date: 10/21/2021  Patient Name: Eric Good  Medical Record Number: [id number]  ??  Diagnosis:  C61. Malignant neoplasm of the prostate  ??  Narrative: Eric Good is a 76 y.o. man currently being treated with definitive therapy for prostate cancer. He presents today for an on-treatment visit.  Will start on Flomax for sx.  ??  Assessment & Plan  ??  Rx:Lt Scapula: 09/07/2021: 3,000/3,000 cGy  WJ:XBJYNWGN+F: 10/21/2021: 6,000/7,000 cGy  ??  Subjective/Assessment/Recommendations:  ??  1. Fatigue: Moderate  2. Pain: Denies pain  3. Urinary Elimination:  slow stream, voids once at night and 5-8 times during the day,occ pain/burning and leaking, frequent urgency  4. Bowel Elimination:  occ mucous??  Plan for Therapy: Continue treatment as planned.  Patient will have a follow-up 2-3 months post treatment.  ??  Therapeutic Interventions (new in bold)  ??  Alpha Antagonists: Tamsulosin (Flomax)  Analgesics: None  5a Reductase Inhibitors: None  Antispasmodics: None  Bowel Agents: None  ??  Toxicity  ??  Diarrhea - Grade 0 - Baseline  Proctitis - Grade 0 - Baseline  Fatigue - Grade 0 - Baseline  Hot flashes - Grade 0 - Baseline  Dermatitis radiation - Grade 0 - Baseline  Urinary frequency - Grade 1 - Present  Urinary incontinence - Grade 0 - Baseline  Urinary retention - Grade 0 - Baseline  Urinary tract pain - Grade 0 - Baseline  Urinary urgency - Grade 1 - Present  Bladder spasm - Grade 0 - Baseline  ??  Nocturia 0 times per night  ??  Hot Flashes 0 times per day  ??  Physical Examination  ??      Wt Readings from Last 10 Encounters:   10/25/21 88.7 kg (195 lb 8 oz)   10/21/21 88.4 kg (194 lb 14.4 oz)   10/14/21 88.7 kg (195 lb 8 oz)   10/07/21 90.1 kg (198 lb 9.6 oz)   10/06/21 89.2 kg (196 lb 9.6 oz)   09/30/21 88.9 kg (196 lb)   09/23/21 88.6 kg (195 lb 6.4 oz)   09/05/21 87.3 kg (192 lb 6.4 oz)   09/01/21 86.7 kg (191 lb 3.2 oz)   08/22/21 87.6 kg (193 lb 1.6 oz)   ??      BP Readings from Last 1 Encounters:   10/25/21 127/73   ??      Pulse Readings from Last 1 Encounters:   10/25/21 72   ??      SpO2 Readings from Last 1 Encounters:   10/25/21 98%   ??  ??  Alert and Orientated X 3.  No acute distress.  CN I-XII grossly intact. No LE edema.  ??  N. Alonna Buckler, MD, PhD  Assistant Professor  Department of Radiation Oncology  [facility] [place] [facility]  [address], [address]  [place], [place] [address]  O: [phone]  11/22/21 12:27 PM

## 2021-11-22 NOTE — Unmapped (Signed)
Hi,     Drayce Tawil contacted the Communication Center requesting to speak with the care team of Eric Good to discuss:    Lab results from 11/21/21.    Please contact [name] at [phone] .      Thank you,   [name]  [facility]   [phone]

## 2021-11-22 NOTE — Unmapped (Signed)
Radiation Oncology Treatment Management Note (OTV)    Encounter Date: 10/14/2021  Patient Name: Eric Good  Medical Record Number: [id number]    Diagnosis:  C61. Malignant neoplasm of the prostate    Narrative: Eric Good is a 76 y.o. man currently being treated with definitive therapy for prostate cancer. He presents today for an on-treatment visit.  Will start on Flomax for sx.    Assessment & Plan    4750 cGy of planned 7000 cGy    Tumor/Palliative response: Unable to asses  Chemotherapy/Systemic therapy:None  Clinical Trial:  No    Plan for Therapy: Continue treatment as planned.  Patient will have a follow-up 2-3 months post treatment.    Therapeutic Interventions (new in bold)    Alpha Antagonists: Tamsulosin (Flomax)  Analgesics: None  5a Reductase Inhibitors: None  Antispasmodics: None  Bowel Agents: None    Toxicity    Diarrhea - Grade 0 - Baseline  Proctitis - Grade 0 - Baseline  Fatigue - Grade 0 - Baseline  Hot flashes - Grade 0 - Baseline  Dermatitis radiation - Grade 0 - Baseline  Urinary frequency - Grade 1 - Present  Urinary incontinence - Grade 0 - Baseline  Urinary retention - Grade 0 - Baseline  Urinary tract pain - Grade 0 - Baseline  Urinary urgency - Grade 1 - Present  Bladder spasm - Grade 0 - Baseline    Nocturia 0 times per night    Hot Flashes 0 times per day    Physical Examination    Wt Readings from Last 10 Encounters:   10/25/21 88.7 kg (195 lb 8 oz)   10/21/21 88.4 kg (194 lb 14.4 oz)   10/14/21 88.7 kg (195 lb 8 oz)   10/07/21 90.1 kg (198 lb 9.6 oz)   10/06/21 89.2 kg (196 lb 9.6 oz)   09/30/21 88.9 kg (196 lb)   09/23/21 88.6 kg (195 lb 6.4 oz)   09/05/21 87.3 kg (192 lb 6.4 oz)   09/01/21 86.7 kg (191 lb 3.2 oz)   08/22/21 87.6 kg (193 lb 1.6 oz)     BP Readings from Last 1 Encounters:   10/25/21 127/73     Pulse Readings from Last 1 Encounters:   10/25/21 72     SpO2 Readings from Last 1 Encounters:   10/25/21 98%       Alert and Orientated X 3.  No acute distress.  CN I-XII grossly intact. No LE edema.    Burlene Arnt, MD, PhD  Assistant Professor  Department of Radiation Oncology  [facility] [facility]  [address]  [place], [place] [address]  O: [phone]  11/22/21 12:27 PM

## 2021-11-23 MED FILL — PREDNISONE 5 MG TABLET: ORAL | 30 days supply | Qty: 30 | Fill #3

## 2021-11-23 MED FILL — ABIRATERONE 250 MG TABLET: ORAL | 30 days supply | Qty: 120 | Fill #3

## 2021-11-23 NOTE — Unmapped (Signed)
[facility] Refill Coordination Note    Specialty Medication(s) to be Shipped:   Hematology/Oncology: abiraterone 250mg  and Transplant: Prednisone 5mg     Other medication(s) to be shipped: No additional medications requested for fill at this time     Eric Good, DOB: 1945/09/03  Phone: [phone] (home)       All above HIPAA information was verified with patient.     Was a Nurse, learning disability used for this call? No    Completed refill call assessment today to schedule patient's medication shipment from the [facility] [phone]).  All relevant notes have been reviewed.     Specialty medication(s) and dose(s) confirmed: Regimen is correct and unchanged.   Changes to medications: Eric Good reports no changes at this time.  Changes to insurance: No  New side effects reported not previously addressed with a pharmacist or physician: None reported  Questions for the pharmacist: No    Confirmed patient received a Conservation officer, historic buildings and a Surveyor, mining with first shipment. The patient will receive a drug information handout for each medication shipped and additional FDA Medication Guides as required.       DISEASE/MEDICATION-SPECIFIC INFORMATION        N/A    SPECIALTY MEDICATION ADHERENCE     Medication Adherence    Patient reported X missed doses in the last month: 0  Specialty Medication: abiraterone 250mg   Patient is on additional specialty medications: Yes  Additional Specialty Medications: Prednisone 5mg   Patient Reported Additional Medication X Missed Doses in the Last Month: 0  Patient is on more than two specialty medications: No              Were doses missed due to medication being on hold? No    Abiraterone 250 mg: 6 days of medicine on hand   Prednisone 5 mg: 2 days of medicine on hand       REFERRAL TO PHARMACIST     Referral to the pharmacist: Not needed      [facility]     Shipping address confirmed in Epic.     Delivery Scheduled: Yes, Expected medication delivery date: 11/24/21.     Medication will be delivered via Next Day Courier to the prescription address in Epic [place].    [facility] Specialty Technician

## 2021-11-24 NOTE — Unmapped (Unsigned)
.  atts

## 2021-12-05 ENCOUNTER — Ambulatory Visit: Admit: 2021-12-05 | Discharge: 2021-12-06 | Payer: MEDICARE

## 2021-12-05 LAB — PSA: PROSTATE SPECIFIC ANTIGEN: 0.11 ng/mL (ref 0.00–4.00)

## 2021-12-05 LAB — HEPATIC FUNCTION PANEL
ALBUMIN: 4 g/dL (ref 3.4–5.0)
ALKALINE PHOSPHATASE: 61 U/L (ref 46–116)
ALT (SGPT): 41 U/L (ref 10–49)
AST (SGOT): 37 U/L — ABNORMAL HIGH (ref <=34)
BILIRUBIN DIRECT: 0.3 mg/dL (ref 0.00–0.30)
BILIRUBIN TOTAL: 0.9 mg/dL (ref 0.3–1.2)
PROTEIN TOTAL: 6.3 g/dL (ref 5.7–8.2)

## 2021-12-05 LAB — POTASSIUM: POTASSIUM: 4.2 mmol/L (ref 3.5–5.1)

## 2021-12-13 DIAGNOSIS — C61 Malignant neoplasm of prostate: Principal | ICD-10-CM

## 2021-12-14 DIAGNOSIS — C61 Malignant neoplasm of prostate: Principal | ICD-10-CM

## 2021-12-15 ENCOUNTER — Ambulatory Visit
Admit: 2021-12-15 | Discharge: 2021-12-15 | Payer: MEDICARE | Attending: Nurse Practitioner | Primary: Nurse Practitioner

## 2021-12-15 ENCOUNTER — Institutional Professional Consult (permissible substitution): Admit: 2021-12-15 | Discharge: 2021-12-15 | Payer: MEDICARE

## 2021-12-15 ENCOUNTER — Other Ambulatory Visit: Admit: 2021-12-15 | Discharge: 2021-12-15 | Payer: MEDICARE

## 2021-12-15 DIAGNOSIS — C61 Malignant neoplasm of prostate: Principal | ICD-10-CM

## 2021-12-15 LAB — HEPATIC FUNCTION PANEL
ALBUMIN: 3.9 g/dL (ref 3.4–5.0)
ALKALINE PHOSPHATASE: 58 U/L (ref 46–116)
ALT (SGPT): 33 U/L (ref 10–49)
AST (SGOT): 32 U/L (ref <=34)
BILIRUBIN DIRECT: 0.2 mg/dL (ref 0.00–0.30)
BILIRUBIN TOTAL: 0.7 mg/dL (ref 0.3–1.2)
PROTEIN TOTAL: 6 g/dL (ref 5.7–8.2)

## 2021-12-15 LAB — PSA: PROSTATE SPECIFIC ANTIGEN: 0.11 ng/mL (ref 0.00–4.00)

## 2021-12-15 LAB — POTASSIUM: POTASSIUM: 4.2 mmol/L (ref 3.5–5.1)

## 2021-12-15 MED ADMIN — leuprolide acetate (6 month) (ELIGARD) injection 45 mg: 45 mg | SUBCUTANEOUS | @ 16:00:00 | Stop: 2021-12-15

## 2021-12-15 NOTE — Unmapped (Signed)
Patient received Eligard 45mg   SQ in Injection site: right lower abdomen. Band aid and guaze applied. Patient tolerated injection without complications and ambulated to checkout without any assistance

## 2021-12-15 NOTE — Unmapped (Addendum)
Labs in 1 month at [facility]  Message sent to Dr Lacretia Nicks re: shoulder  Let me know if you want to try the Flomax  8/1: Labs, Dr Philomena Course  Liver labs normal      Lab Results   Component Value Date    PSA 0.11 12/15/2021    PSA 0.11 12/05/2021    PSA 0.15 11/21/2021    PSA 0.21 11/07/2021    PSA 0.19 10/31/2021    PSA 0.23 10/24/2021         Albertina Parr, NP-C, OCN  Adult Nurse Practitioner  Urology and Medical Oncology    Notice: Many test results will be automatically released into MyChart. This may happen before your provider has a chance to review them. Your results will either be reviewed with you at your scheduled appointment or your provider or someone from their office will reach out to you to discuss the results. Thank you for your understanding    If you have been prescribed a medication today, always read the package insert that comes with the medication.    In the event of an emergency, always call 911    Urology:  Main clinic & [place]: [phone]  Fax: [phone]    Cancer Hospital:  Phone: [phone]  Fax: [phone]    After hours/nights/weekends:  Hospital Operator: [phone]    [email or web address]  [email or web address]

## 2021-12-15 NOTE — Unmapped (Signed)
[facility] Refill Coordination Note    Specialty Medication(s) to be Shipped:   Hematology/Oncology: abiraterone 250mg  and Transplant: Prednisone 5mg     Other medication(s) to be shipped: No additional medications requested for fill at this time     Eric Good, DOB: [birth year]  Phone: [phone] (home)       All above HIPAA information was verified with patient.     Was a Nurse, learning disability used for this call? No    Completed refill call assessment today to schedule patient's medication shipment from the [facility] [phone]).  All relevant notes have been reviewed.     Specialty medication(s) and dose(s) confirmed: Patient reports changes to the regimen as follows: 3QD to see if it works better    Changes to medications: Eric Good reports no changes at this time.  Changes to insurance: No  New side effects reported not previously addressed with a pharmacist or physician: None reported  Questions for the pharmacist: No    Confirmed patient received a Conservation officer, historic buildings and a Surveyor, mining with first shipment. The patient will receive a drug information handout for each medication shipped and additional FDA Medication Guides as required.       DISEASE/MEDICATION-SPECIFIC INFORMATION        N/A    SPECIALTY MEDICATION ADHERENCE     Medication Adherence    Patient reported X missed doses in the last month: 0  Specialty Medication: Abiraterone 250mg   Patient is on additional specialty medications: Yes  Additional Specialty Medications: Prednisone 5mg   Patient Reported Additional Medication X Missed Doses in the Last Month: 0  Patient is on more than two specialty medications: No  Informant: patient              Were doses missed due to medication being on hold? No    Abiraterone 250 mg: 14 days of medicine on hand   Prednisone 5 mg: 11 days of medicine on hand       REFERRAL TO PHARMACIST     Referral to the pharmacist: Not needed      SHIPPING     Shipping address confirmed in Epic.     Delivery Scheduled: Yes, Expected medication delivery date: 12/23/21.     Medication will be delivered via Next Day Courier to the prescription address in Epic [place].    [name] M [name]   [facility] Specialty Technician

## 2021-12-15 NOTE — Unmapped (Signed)
Peripheral stick done by Jeff Fasking, 23g L AC, labs drawn and sent.

## 2021-12-15 NOTE — Unmapped (Signed)
[facility] Visit Note    Patient Name: Eric Good  Patient Age: 76 y.o.  Encounter Date: 12/15/2021  Attending Provider:  Young E. 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    Eric Good is a 33 y.o. man with newly diagnosed de novo metastatic prostate cancer, T2N1M1a, involving pelvic and retroperitoneal lymph nodes on conventional CT and bone scans. PSA being 45 and Gleason 4+5=9 disease also support this diagnosis.    I discussed with the patient the long term prognosis of prostate cancer and treatment options, such as androgen deprivation therapy, and benefit and side effects of ADT.  Side effects includes but are not limited to loss of libido, erectile dysfunction, hot flashes, fatigue, weight gain, metabolic syndrome that increases the risk of diabetes and cardiovascular events, osteoporosis, and reduced muscle mass.  I have counseled the patient on the need to take calcium and vitamin D as well as regular weight bearing exercise to minimize the side effects of ADT. It is expected that virtually all patients will develop progressive disease while on ADT and at that time additional treatment would be required, but ultimately, the patient may pass away from prostate cancer.    A series of recent studies demonstrate the benefit of additional therapy (docetaxel per CHAARTED and STAMPEDE, abiraterone/prednisone per LATITUTUDE, enzalutamide per ARCHES and ENZAMET, apalutamide per TITAN).  Therefore, the current option would be to add one of AR targeted agents. Docetaxel chemo would not be recommended for low volume patients.  I discussed the consideration of additional therapy and the benefit and side effects of an agent such as abiraterone.    On 06/16/21, patient returns after PSMA-PET, which showed uptake in the prostate, multiple retrocaval and left iliac lymph nodes, and the left scapular body, concerning for metastatic disease. We discussed the implications of these findings and re-discussed the risks and benefits of ADT as well as the possible benefit from the addition of an AR-targeted therapy. Patient elected today to start treatment with ADT.    In 07/2021, PSA down on ADT. After discussion of benefit/side effects, abiraterone and prednisone prescribed. Will get somatic tumor genomic testing. Also referral to radiation oncology.    In 08/2021, PSA is 1.33. His LFT's are okay. I recommend obtaining germline testing at his next visit. I also spoke to him about joining the [employer] study. Pt was agreeable to both.    In 10/2021, PSA is 0.39. His LFT's are slightly elevated.  We will continue to monitor LFT's every 2 weeks and hope to see improvement/normalization.  Otherwise tolerating ADT, abi, and pred well. I'll see him back in mid-June for his next dose of Eligard. He was also noted to have bradycardia on vitals.  EKG today without bradycardia but noted ventricular bigeminy/frequent PVCs.  He is asymptomatic today but did have an episode of chest pain for 20 minutes 4 nights ago.  We will have him see cardiology again.     On  12/15/2021, doing well, having some shoulder pain    Plan  1. Continue ADT with Eligard 45 mg subcutaneous, given last on 12/15/2021, due next on or after 06/13/2022 per therapy plan  -- Abiraterone 1000 mg every day and prednisone 5 mg daily. Follow LFT's, K, BP.  -- We do not recommend docetaxel chemo, since low volume disease.  2. Started IMRT to the prostate and pelvic nodes and SRBT to the left scapula and prostate on 09/05/2021.  3. Somatic tumor mutation profiling done, no targetable mutations  4. Germline testing negative  5. Since LFTs normalized, does not need to have q 2 week labs right now  6. Note sent to Dr Alphonse Guild re: shoulder pain to see if could be xrt SE. We will consider imaging. Will let him know when I hear back. He also has an appt with them later this month    Follow up  -1 month: Labs at [facility] per his preference  -8/1: Labs, visit with Dr Philomena Course    I personally spent 45 minutes face-to-face and non-face-to-face in the care of this patient, which includes all pre, intra, and post visit time on the date of service.  All documented time was specific to the E/M visit and does not include any procedures that may have been performed.    This patient was seen and discussed with Dr. Philomena Course.    Esperanza Richters, MD  Hematology / Oncology Fellow    Reason for Visit  Follow up of prostate cancer    History of Present Illness:  Oncology History Overview Note   In 04/2021, screening PSA of 49, then 45.3. DRE abnormal, firm at L base  In 05/2021, prostate biopsy showed Gleason 4+5=9, 6/12 cores involved. CT showed retroperitoneal and pelvic adenopathy. Bone scan showed single focus in L scapula, nonspecific.  In 06/2021, PSMA PET showed retroperitoneal/pelvic node mets, L scapular met.  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 Over the last few days, the pain is lingering a little longer. Popping not a/w with certain movements. Has some pain when he moves his arm  inward. No pain with palpation. No injury. 8-9 years ago he had an injection in that shoulder    Hot flashes are stable  + fatigue  Having urinary/weak stream; stable.    Allergies:  Allergies   Allergen Reactions    Flonase [Fluticasone]      Nose bleed    Budesonide      Other reaction(s): Other (See Comments)  Epistaxis       Current Medications:    Current Outpatient Medications:     abiraterone (ZYTIGA) 250 mg Tab tablet, Take 4 tablets (1,000 mg total) by mouth daily. (Patient taking differently: Take 3 tablets (750 mg total) by mouth daily. 750mg  now), Disp: 120 tablet, Rfl: 11    calcium carbonate 300 mg (750 mg) Chew, , Disp: , Rfl:     cholecalciferol, vitamin D3-125 mcg, 5,000 unit,, 125 mcg (5,000 unit) tablet, Take 1 tablet (125 mcg total) by mouth two (2) times a day., Disp: , Rfl:     esomeprazole (NEXIUM) 20 MG capsule, Take 1 capsule (20 mg total) by mouth in the morning. (Patient not taking: Reported on 10/07/2021), Disp: , Rfl:     fish oil-omega-3 fatty acids 300-1,000 mg capsule, Take 2 capsules (2 g total) by mouth daily., Disp: , Rfl:     loperamide HCl (IMODIUM A-D ORAL), , Disp: , Rfl:     melatonin 3 mg Tab, Take 1 tablet (3 mg total) by mouth nightly as needed., Disp: , Rfl:     multivitamin (TAB-A-VITE/THERAGRAN) per tablet, Take 1 tablet by mouth daily., Disp: , Rfl:     naphazoline-pheniramine (NAPHCON-A) 0.025-0.3 % ophthalmic solution, Apply to eye every hour as needed. (Patient not taking: Reported on 10/07/2021), Disp: , Rfl:     olopatadine (PATADAY ONCE DAILY RELIEF) 0.7 % ophthalmic solution, Administer 1 drop to both eyes daily. (Patient not taking: Reported on 10/21/2021), Disp: , Rfl:     predniSONE (DELTASONE) 5 MG tablet, Take 1 tablet (5 mg total) by mouth daily. (Patient not taking: Reported on 10/25/2021), Disp: 30 tablet, Rfl: 11    vitamin B complex vit C no.3 (VITAMIN B COMP AND C NO.3 ORAL), Take by mouth daily as needed. , Disp: , Rfl:     vitamin D3-folic acid 125 mcg (5,000 unit)-1 mg Tab, Take 125 mcg by mouth in the morning., Disp: , Rfl:     zinc gluconate 50 mg (7 mg elemental zinc) tablet, Take 1 tablet (50 mg total) by mouth daily., Disp: , Rfl:     Past Medical History and Social History  Past Medical History:   Diagnosis Date    Rapid heart beat     Negative cardiac workup in 2020      No past surgical history on file.     Social History     Occupational History    Not on file   Tobacco Use    Smoking status: Some Days     Types: Cigars     Start date: 12/11/2016    Smokeless tobacco: Never   Substance and Sexual Activity    Alcohol use: Not on file    Drug use: Not on file    Sexual activity: Not on file       Family History    Prostate Cancer Family History Assessment:  History of cancer in children (yes/no; if yes, what type AND age of diagnosis): No  History of cancer in siblings (yes/no; if yes, provide relation, type of cancer, AND age  of diagnosis): No  History of cancer in parents (yes/no; if yes, please specify parent, type of cancer, AND age of diagnosis): Yes, sister with stomach and eye cancer, age 87  History of cancer in aunts/uncles/grandparents (yes/no; if yes, provide relation, type of cancer, AND age of diagnosis): No      Review of Systems:  A comprehensive review of 10 systems was negative except for pertinent positives noted in HPI.    Physical Exam:    VITAL SIGNS:  BP 131/79  - Pulse 63  - Temp 36.3 ??C (97.3 ??F) (Temporal)  - Resp 20  - Ht 175.3 cm (5' 9)  - Wt 86.8 kg (191 lb 4.8 oz)  - BMI 28.25 kg/m??   ECOG Performance Status: 1  GENERAL: Well-developed, well-nourished patient in no acute distress.  HEAD: Normocephalic and atraumatic.  EYES: Conjunctivae are normal. No scleral icterus.  CARDIOVASCULAR: Normal rate, irregular rhythm, no murmurs  PULMONARY/CHEST: No auditory wheezing or stridor. No respiratory distress.  ABDOMINAL:  Soft. There is no distension. There is no tenderness. There is no rebound and no guarding.  MUSCULOSKELETAL: No clubbing, cyanosis, or lower extremity edema.  L UE: Has full ROM. Some pain with lateral extension of L UE  PSYCHIATRIC: Alert and oriented.  Normal mood and affect.  NEUROLOGIC: No focal motor deficit. Normal gait.  SKIN: Skin is warm, dry, and intact.      Results/Orders:      Lab on 12/15/2021   Component Date Value Ref Range Status    PSA 12/15/2021 0.11  0.00 - 4.00 ng/mL Final    Potassium 12/15/2021 4.2  3.5 - 5.1 mmol/L Final    Albumin 12/15/2021 3.9  3.4 - 5.0 g/dL Final    Total Protein 12/15/2021 6.0  5.7 - 8.2 g/dL Final    Total Bilirubin 12/15/2021 0.7  0.3 - 1.2 mg/dL Final    Bilirubin, Direct 12/15/2021 0.20  0.00 - 0.30 mg/dL Final    AST 16/04/9603 32  <=34 U/L Final    ALT 12/15/2021 33  10 - 49 U/L Final    Alkaline Phosphatase 12/15/2021 58  46 - 116 U/L Final     Lab Results   Component Value Date    PSA 0.11 12/15/2021    PSA 0.11 12/05/2021    PSA 0.15 11/21/2021    PSA 0.21 11/07/2021    PSA 0.19 10/31/2021    PSA 0.23 10/24/2021         Orders placed or performed in visit on 12/15/21    PSA (Prostate Specific Antigen)    Potassium Level    Hepatic Function Panel    Clinic Appointment Request Physician, Lab    LAB Appointment Request    Clinic Appointment Request Physician, Lab Molecular Pathology  Tumor mutation profiling on 08/01/18  TMB 0 m/Mb  MSS  APC  MTA  PTEN  CDKN2A/B  CIC  TP53    Germline testing [facility] on 09/12/21  Negative    Pathology  05/04/2021  [A] PROSTATE, LEFT BASE:   ACINAR ADENOCARCINOMA, GLEASON 4+3=7  (GG   3), INVOLVING 1 OF 1 CORES, MEASURING 5  MM ( 71%). 80% PATTERN 4;   CRIBRIFORM PATTERN 4 PRESENT    [B] PROSTATE, LEFT MID:   ACINAR ADENOCARCINOMA, GLEASON 4+3=7  (GG 3),   INVOLVING 1 OF 1 CORES, MEASURING 5  MM ( 83%). 80% PATTERN 4;   CRIBRIFORM PATTERN 4 PRESENT    [C] PROSTATE, LEFT APEX:   ACINAR ADENOCARCINOMA,  GLEASON 4+3=7  (GG   3), INVOLVING 1 OF 2 CORES, MEASURING 4  MM ( 40%). 70% PATTERN 4;   CRIBRIFORM PATTERN 4 PRESENT    [D] PROSTATE, RIGHT BASE:   NEGATIVE FOR MALIGNANCY.    [E] PROSTATE, RIGHT MID:   NEGATIVE FOR MALIGNANCY.    [F] PROSTATE, RIGHT APEX:   NEGATIVE FOR MALIGNANCY.    [G] PROSTATE, LEFT LATERAL BASE:   ACINAR ADENOCARCINOMA, GLEASON 4+4=8   (GG 4), INVOLVING 1 OF 1 CORES, MEASURING 5  MM ( 50%). PERINEURAL   INVASION PRESENT    [H] PROSTATE, LEFT LATERAL MID:   ACINAR ADENOCARCINOMA, GLEASON 4+5=9   (GG 5), INVOLVING 1 OF 1 CORES, MEASURING 9  MM ( 100%). CRIBRIFORM   PATTERN 4 PRESENT    [I] PROSTATE, LEFT LATERAL APEX:   ACINAR ADENOCARCINOMA, GLEASON 4+3=7   (GG 3), INVOLVING 2 OF 2 CORES, MEASURING 8  MM ( 100%). 80% PATTERN 4;   CRIBRIFORM PATTERN 4 PRESENT; PERINEURAL INVASION PRESENT    [J] PROSTATE, RIGHT LATERAL BASE:   NEGATIVE FOR MALIGNANCY.    [K] PROSTATE, RIGHT LATERAL MID:   NEGATIVE FOR MALIGNANCY.    [L] PROSTATE, RIGHT LATERAL APEX:   NEGATIVE FOR MALIGNANCY.     Imaging results:  CT AP 05/19/2021  LYMPH NODES: Retroperitoneal and pelvic adenopathy with index lesions as follows  --1.5 cm retrocaval lymph node  -- 1.4 cm left common iliac lymph node.  --1.9 cm left external iliac lymph node  --1.8 cm left internal iliac lymph  node  IMPRESSION:  Retroperitoneal and  left pelvic adenopathy, suspicious for metastatic disease.     Heterogeneous appearance of the prostate, consistent with known neoplasm.     Indeterminate lesion in the right posterior 12th rib. Recommend correlation with concurrent bone scan.     Mildly hyperenhancing right renal lesion, suspicious for cortical neoplasm. Recommend correlation with prior imaging if available. If not, MRI of the abdomen with and without contrast may be performed for definitive characterization.    Bone scan 05/19/2021  Focal area of increased radiotracer uptake within the left scapula which could represent osseous metastasis or remote trauma, indeterminate      PET CT Skull Base to Thigh 06/13/2021  -Mild radiotracer uptake within the prostate, likely corresponding to patient's known neoplasm.   -Multiple radiotracer avid retrocaval and left iliac lymph nodes, as described above, concerning for metastatic disease. Tiny right iliac/pelvic sidewall lymph nodes with no significant uptake, indeterminate.   -Radiotracer uptake within the left scapular body, corresponding to increased tracer uptake on recent bone scan, concerning for osseous metastatic disease.

## 2021-12-22 ENCOUNTER — Ambulatory Visit
Admit: 2021-12-22 | Discharge: 2021-12-23 | Payer: MEDICARE | Attending: Student in an Organized Health Care Education/Training Program | Primary: Student in an Organized Health Care Education/Training Program

## 2021-12-22 ENCOUNTER — Ambulatory Visit
Admit: 2021-12-22 | Discharge: 2021-12-30 | Payer: MEDICARE | Attending: Student in an Organized Health Care Education/Training Program | Primary: Student in an Organized Health Care Education/Training Program

## 2021-12-22 MED ORDER — TAMSULOSIN 0.4 MG CAPSULE
ORAL_CAPSULE | Freq: Every day | ORAL | 11 refills | 30 days | Status: CP
Start: 2021-12-22 — End: 2022-12-22

## 2021-12-22 MED FILL — PREDNISONE 5 MG TABLET: ORAL | 30 days supply | Qty: 30 | Fill #4

## 2021-12-22 MED FILL — ABIRATERONE 250 MG TABLET: ORAL | 30 days supply | Qty: 120 | Fill #4

## 2021-12-22 NOTE — Unmapped (Signed)
12/22/2021    Dose Site Summary:    Subjective/Assessment/Recommendations:    1. Fatigue: Moderate  2. Pain: Moderate  3. Urinary Elimination: Urgency  4. Bowel Elimination: No issues  5. Prescription Needs: Needs refills for prednisone  6. Psychosocial: Has limited support  7. Screening form: Completed  8. Other: pt is worried about his shoulder pain

## 2021-12-24 DIAGNOSIS — C61 Malignant neoplasm of prostate: Principal | ICD-10-CM

## 2021-12-24 DIAGNOSIS — C7951 Secondary malignant neoplasm of bone: Principal | ICD-10-CM

## 2021-12-27 ENCOUNTER — Ambulatory Visit: Admit: 2021-12-27 | Discharge: 2021-12-28 | Payer: MEDICARE

## 2021-12-27 DIAGNOSIS — C7951 Secondary malignant neoplasm of bone: Principal | ICD-10-CM

## 2021-12-29 NOTE — Unmapped (Signed)
Attending Attestation    I saw and evaluated/examined the patient, participating in the key portions of the service.  I discussed the findings, assessment, and plan of care with the resident/PA/NP.  I agree with the findings and plan as documented in their note.    Burlene Arnt, MD, PhD  Assistant Professor  Department of Radiation Oncology  [employer] [facility]  [id number]  [place], [place] [address]  O: [phone]  01/04/22 11:09 AM    Radiation Oncology Follow Up Note    Patient: Eric Good  VISIT DATE: 12/29/2021  Diagnosis:  Prostate Cancer    Prior Treatment:   Pelvic IMRT: 250cGy x 65fx = 7000cGy to the prostate  180cGy x 85fx = 5040cGy to the pelvic nodes  250cGy x 32fx = 7000cGy to the gross nodes, with undercoverage as needed to meet bowel constraints  Completed 10/27/21    Scapula SBRT  SBRT 1000cGy x 3 fx = 3000cGy to left scapula, completed 09/07/21        ASSESSMENT AND PLAN:   Eric Good is a 76 y.o. male with oligometastatic prostate cancer Gleason 4+5, PSA of 49.4, PSMA showing left scapula uptake and several pelvic nodes. Now s/p SBRT to L scapula and IMRT to prostate and pelvic nodes    Prostate Cancer:   -Follows with Dr. Philomena Course for Abi+ADT  -Return in 3 months for follow up  -Urinary symptoms decreasing    L shoulder pain: CT LUE without new/changing sites of disease. Prior treated scapular lesion stable.   -Pain with external rotation worrisome for rotator cuff injury but could also reflect bursitis or other inflammatory condition.   -Recommend rest and NSAIDs, will continue to follow. Could consider MRI if continues to worsen    -------------------------------------------     Subjective/Interval History:   Eric Good returns for a regularly scheduled follow up. He overall has done well with radiation treatment: His urinary symptoms are improving with diminished hesitancy/frequency. He has stopped taking flomax regularly although uses intermittently for hesitancy. He denies diarrhea and bleeding. He has pain in the L shoulder worsening over several weeks. Limiting function of shoulder. Has not taken medications to alleviate pain due to concern for interaction with other meds.     10 system ROS reviewed and negative except as documented in HPI above.       Oncology History Overview Note   In 04/2021, screening PSA of 49, then 45.3. DRE abnormal, firm at L base  In 05/2021, prostate biopsy showed Gleason 4+5=9, 6/12 cores involved. CT showed retroperitoneal and pelvic adenopathy. Bone scan showed single focus in L scapula, nonspecific.  In 06/2021, PSMA PET showed retroperitoneal/pelvic node mets, L scapular met.  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    General: Well-appearing, no acute distress  HEENT: Normocephalic, moist mucous membranes  CV: Warm, well perfused  Respiratory: Breathing comfortably on room air  GI: non-distended  Neuro: Alert and oriented to conversation  Psych: Appropriate insight and affect  MSK: LUE: Able to flex and abduct arm 90 degrees without difficulty. Some pain to palpation over [facility] joint and pain with external rotation.     _____________________    Elita Quick, MD, MS  Radiation Oncology PGY-3  12/29/2021  11:17 AM

## 2021-12-29 NOTE — Unmapped (Signed)
SiteLast TxDose  Rx:Lt Scapula: 09/07/2021: 3,000/3,000 cGy  ZO:[id number]+U: 10/27/2021: 7,000/7,000 cGy  Wt Readings from Last 6 Encounters:   12/29/21 87.3 kg (192 lb 8 oz)   12/22/21 86.5 kg (190 lb 9.6 oz)   12/15/21 86.8 kg (191 lb 4.8 oz)   10/25/21 88.7 kg (195 lb 8 oz)   10/21/21 88.4 kg (194 lb 14.4 oz)   10/14/21 88.7 kg (195 lb 8 oz)    Patient had CT scan 12/27/2021 for new onset left shoulder constant ache that started 2-3 weeks ago. No pain relievers taken, denies and strenuous physical activity, falls or injury.

## 2022-01-02 ENCOUNTER — Ambulatory Visit: Admit: 2022-01-02 | Discharge: 2022-01-03 | Payer: MEDICARE

## 2022-01-02 LAB — HEPATIC FUNCTION PANEL
ALBUMIN: 3.7 g/dL (ref 3.4–5.0)
ALKALINE PHOSPHATASE: 58 U/L (ref 46–116)
ALT (SGPT): 35 U/L (ref 10–49)
AST (SGOT): 32 U/L (ref <=34)
BILIRUBIN DIRECT: 0.3 mg/dL (ref 0.00–0.30)
BILIRUBIN TOTAL: 0.8 mg/dL (ref 0.3–1.2)
PROTEIN TOTAL: 5.9 g/dL (ref 5.7–8.2)

## 2022-01-02 LAB — POTASSIUM: POTASSIUM: 4.3 mmol/L (ref 3.5–5.1)

## 2022-01-02 LAB — PSA: PROSTATE SPECIFIC ANTIGEN: 0.08 ng/mL (ref 0.00–4.00)

## 2022-01-03 NOTE — Unmapped (Unsigned)
Thank you for your e-consult to the [facility] Cancer Genetics team.    SUMMARY:   [name] is an 76 y.o. 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  His family history is notable for his sister with stomach and eye cancer at age 39.     This patient met NCCN criteria for evaluation of hereditary cancer risk based on his diagnosis with metastatic hormone sensitive prostate cancer, thus genetic testing was recommended to guide treatment and therapy decisions. [name]'s test was performed at [facility] and included sequencing and deletion/duplication analysis of the following genes: BRCA1, BRCA2, ATM, BARD1, BRIP1, FANCA, CHEK2, HOXB13, MLH1, MSH2, MSH6, PMS2, EPCAM, PALB2, RAD51C, RAD51D, and TP53.     RESULT:  Normal result. Testing did not identify any genetic changes associated with cancer predisposition.     A copy of the genetic test results are available in the Media tab dated 09/12/2021.    GENETICS ASSESSMENT:  Genetic testing was normal. Together with [name]'s relatively reassuring family history, this suggests that the chance for a hereditary cancer risk syndrome is very low.  It most likely means that [name] does not have a strong genetic factor contributing to his history of prostate cancer. We consider this normal result to be reassuring in terms of an inherited cancer risk syndrome. A detailed family history is critical in the accurate interpretation of a genetic test result.  If the family history is not well documented in the patient's medical record, the recommendations provided with the associated genetic test result cannot be completely individualized to the patient.        Family members may be at increased risk to develop various cancers, based on the family history. In particular, male first degree family members to the patient (ie: father, brothers, sons, as applicable) are at a somewhat increased risk of developing prostate simply due to having a close relative with prostate cancer.  Family members should discuss the family history and this normal genetic testing result with their health team to determine the most appropriate approach to cancer screening for them.    Changes in [name]'s personal or family history might alter our assessment of the chance that his cancer or those in the family were due to an inherited cancer risk. In addition, future advances in genetic testing might lead to additional ways to assess cancer risk in patients with normal genetic testing for inherited cancer genetics syndromes. Future re-evaluation may be appropriate.      RECOMMENDATIONS:      1) We do not recommend additional genetic testing for Mr. [name] at this time.     2) Formal Cancer Genetics consultation is not recommended but is available to the patient if they would like to meet with [place] at a future time to discuss the cancer history and possibility for an inherited cancer risk syndrome.    3) Family members may wish to discuss the family history with their providers to determine appropriate cancer screening    _________________________________________________    [facility]  [phone]        Thank you for your e-Consult.    To summarize: [name] had negative germline genetic testing as detailed above and his family history does not support a germline cancer predisposition (although with limited documentation in the chart), thus his prostate cancer was most likely sporadic and  no additional testing or consultation with genetics are recommended.     My recommendations are as follows: We do not recommend additional genetic testing or a formal genetics consultation for [name] at this time.      I spent 5-10 minutes in medical consultative discussion and review of medical records, including a written report to the treating provider via electronic health record regarding the condition of this patient.     This e-Consult did include an answerable clinical question and did not recommend a clinic visit.     Jaylene Schrom, CGC  Ronalee Belts, MD     The recommendations provided in this eConsult are based on the clinical data available to me and are furnished without the benefit of a comprehensive in-person evaluation of the patient. Any new clinical issues or changes in patient status not available to me will need to be taken into account when assessing these recommendations. The ongoing management of this patient is the responsibility of the referring clinician. Please contact me if you have further questions.

## 2022-01-12 NOTE — Unmapped (Signed)
Radiation Oncology Follow-up Note    Patient Name: Eric Good  Date of Service: 12/22/2021     Interval since RT    Treatment site Treatment Technique/Modality Energy Dose per fraction Total number  of fractions Total dose Start date End date   Prostate and gross pelvic LNs IMRT  6 MV 250 cGy 28 7000 cGy 09/20/21  ?? 10/27/21   pevlic LNs IMRT  6 MV 180 cGy 28 5040 cGy 09/20/21 10/27/21   Left scapula SBRT 6 MV 1000 cGy 3 3000 c Gy 09/05/21 09/07/21         Assessment    Eric Good is a 76 y.o. male who returns today in follow-up with after RT to the left scapula and for OM and to the pevlis for his primary PCA.    Since completion of radiation therapy, he has had worsening pain in the left arm with movement.    Plan    Will order a CT left scapula to evaluate his pain.  --    Narrative    Eric Good is a 76 y.o. male who initially presented to medical attention with OM prostate cancer.        Component Ref Range & Units 12/15/21 0930 12/05/21 1137 11/21/21 1047 11/07/21 1005 10/31/21 1034 10/24/21 1016 10/17/21 1000     PSA 0.00 - 4.00 ng/mL 0.11  0.11  0.15  0.21  0.19  0.23  0.25                 Over the past several weeks he has had worsening pain of the left arm.  The pain comes with abduction of the arm and is worse with external rotation.    Medications    The patient has a current medication list which includes the following prescription(s): abiraterone, calcium carbonate, cholecalciferol (vitamin d3-125 mcg (5,000 unit)), esomeprazole, fish oil-omega-3 fatty acids, loperamide hcl, melatonin, multivitamin, pataday once daily relief, prednisone, tamsulosin, vitamin b complex vit c no.3, vitamin d3-folic acid, and zinc gluconate.    Physical Examination    Wt Readings from Last 1 Encounters:   12/29/21 87.3 kg (192 lb 8 oz)     Temp Readings from Last 1 Encounters:   12/29/21 36.8 ??C (98.3 ??F) (Temporal)     BP Readings from Last 1 Encounters:   12/22/21 124/75     Pulse Readings from Last 1 Encounters: 12/22/21 66     SpO2 Readings from Last 1 Encounters:   12/22/21 96%      General:  The patient is sitting comfortably in no acute distress.  HEENT:  Normocephalic, atraumatic  Lymph:  No palpable lymphadenopathy   CV:  Regular rate and rhythm  Resp:  Good inspiratory effort  Abd:  Soft, non-distended  Back:  No bony tenderness  Ext:  No clubbing, cyanosis, or edema. ROM limited in left arm.  Popping felt over left shoulder with movement.  Neuro:  CN II-XII grossly intact and symmetric, no focal neurologic deficits  Skin:  No skin lesions were noted      ECOG Performance Status: 1 = Restricted in physically strenuous activity but ambulatory and able to carry out work of a light or sedentary nature, e.g., light house work, office work    Imaging and Pathology: As documented in the HPI.    Diagnosis    No primary diagnosis found.

## 2022-01-27 NOTE — Unmapped (Signed)
The [facility] has made a second and final attempt to reach this patient to refill the following medication:abiraterone.      We have left voicemails on the following phone numbers: [phone], have sent a text message to the following phone numbers: [phone], and have sent a Mychart questionnaire..    Dates contacted: 07/12,07/17,07/28  Last scheduled delivery: 06/22    The patient may be at risk of non-compliance with this medication. The patient should call the [facility] at [phone]  Option 4, then Option 1 (oncology) to refill medication.    Eric Good   [facility] Specialty Technician

## 2022-01-27 NOTE — Unmapped (Signed)
Called both patient and his sister to inform that patient needs to call [facility] (P): [phone]   To schedule delivery of his medication.

## 2022-01-30 NOTE — Unmapped (Signed)
01/30/22-pt called back has 19 days supply on Abiraterone requested a follow-up call next week -CB

## 2022-01-31 ENCOUNTER — Ambulatory Visit: Admit: 2022-01-31 | Discharge: 2022-01-31 | Payer: MEDICARE

## 2022-01-31 ENCOUNTER — Ambulatory Visit
Admit: 2022-01-31 | Discharge: 2022-01-31 | Payer: MEDICARE | Attending: Hematology & Oncology | Primary: Hematology & Oncology

## 2022-01-31 DIAGNOSIS — I498 Other specified cardiac arrhythmias: Principal | ICD-10-CM

## 2022-01-31 DIAGNOSIS — R079 Chest pain, unspecified: Principal | ICD-10-CM

## 2022-01-31 DIAGNOSIS — C61 Malignant neoplasm of prostate: Principal | ICD-10-CM

## 2022-01-31 DIAGNOSIS — Z9189 Other specified personal risk factors, not elsewhere classified: Principal | ICD-10-CM

## 2022-01-31 DIAGNOSIS — M8580 Other specified disorders of bone density and structure, unspecified site: Principal | ICD-10-CM

## 2022-01-31 DIAGNOSIS — M19019 Primary osteoarthritis, unspecified shoulder: Principal | ICD-10-CM

## 2022-01-31 LAB — HEPATIC FUNCTION PANEL
ALBUMIN: 3.8 g/dL (ref 3.4–5.0)
ALKALINE PHOSPHATASE: 59 U/L (ref 46–116)
ALT (SGPT): 24 U/L (ref 10–49)
AST (SGOT): 28 U/L (ref <=34)
BILIRUBIN DIRECT: 0.4 mg/dL — ABNORMAL HIGH (ref 0.00–0.30)
BILIRUBIN TOTAL: 0.9 mg/dL (ref 0.3–1.2)
PROTEIN TOTAL: 6.2 g/dL (ref 5.7–8.2)

## 2022-01-31 LAB — POTASSIUM: POTASSIUM: 4.3 mmol/L (ref 3.5–5.1)

## 2022-01-31 LAB — PSA: PROSTATE SPECIFIC ANTIGEN: 0.04 ng/mL (ref 0.00–4.00)

## 2022-01-31 MED ORDER — ABIRATERONE 250 MG TABLET
ORAL_TABLET | Freq: Every day | ORAL | 11 refills | 30 days | Status: CP
Start: 2022-01-31 — End: ?
  Filled 2022-02-08: qty 90, 30d supply, fill #0

## 2022-01-31 NOTE — Unmapped (Addendum)
Lab Results   Component Value Date    PSA 0.04 01/31/2022    PSA 0.08 01/02/2022    PSA 0.11 12/15/2021    PSA 0.11 12/05/2021    PSA 0.15 11/21/2021    PSA 0.21 11/07/2021      Continue with current treatment. I ordered DEXA bone density scan to be done prior to next visit in Dec.  Lab check in 2 months, then return in 4 months.    We discussed restarting weight resistance exercises.    Please call [phone] to reach my nurse navigator Konrad Penta for any issues.    For emergencies on Nights, Weekends and Holidays  Call [phone] for help.      Griffin Basil, MD, PhD  Associate Professor of Medicine  Division of Hematology-Oncology    [facility]  Genitourinary Oncology Clinic  Nurse Navigator: Konrad Penta  Fax: [phone]

## 2022-01-31 NOTE — Unmapped (Signed)
Pt has hx of Ventricular Bigeminy and chest pain.  Last seen in April for results of Zio patch.  No further chest pain.  Is having some left shoulder pain where his radiation was done.  He has also had a steroid injection in the same shoulder.  He says his chest pain is centered in his chest and not usually not in left shoulder.

## 2022-01-31 NOTE — Unmapped (Signed)
Clinical Assessment Needed For: Dose Change  Medication: Abiraterone  Last Fill Date/Day Supply: 6-22 / 30  Copay $100  Was previous dose already scheduled to fill: No    Notes to Pharmacist:

## 2022-01-31 NOTE — Unmapped (Signed)
DIVISION OF CARDIOLOGY   [facility] [place]        Date of Service: 01/31/2022    Assessment/Plan   1. Ventricular bigeminy  Appears to be well controlled.  He has had no significant symptomatology.  Last Zio patch showed 6.5% PVC burden.  We will plan on repeating his Zio patch at his next clinic visit.    2. Chest pain  Fairly well controlled symptomatology.  No evidence of ischemic driven.  Suspect may be secondary to radiation.  No plans for further work-up at this time    3. 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  Given some of his description of his injury it would not surprise me if he has additional soft tissue involvement.  He is requesting referral to orthopedic surgery which typically I defer to his primary but made the referral per his request.  - Ambulatory referral to Orthopedic Surgery; Future    4. Risk of myocardial infarction or stroke 7.5% or greater in next 10 years  Recommended the patient is a very high risk of cardiovascular events based on his ASCVD risk score.  I strongly recommended that he should initiate statin therapy given his risk score.  Patient deferred.  He is going to work on his exercise and diet and is willing to discuss this further at a follow-up in 1 years time.    Return to clinic:  Return in about 1 year (around 02/01/2023).    The 10-year ASCVD risk score (Arnett DK, et al., 2019) is: 29.3%      Subjective:   ZOX:WRUE D Letitia Libra, MD  Chief complaint:  76 y.o. male with a history of HLD, GERD, nonsustained VT, and PVC is referred by Dr. Griffin Basil for evaluation of chest pain.    History of Present Illness:  Patient reports he has been doing well since last clinic visit.  He denies any ectopy or PVCs.  He reports that he is more having problems with shoulder pain than anything.  Reports that he has had arthritis with injections in the shoulder in the past but the pain seems to have intensified since radiation treatments.  She reports that sometimes she will feel a popping or clicking sensation in the joint and he loses strength and then it resolves once he puts it back into position.  He reports that he had a CT done looking for mets and they noted he had arthritis and inflammation in his shoulder.  He is requesting referral to orthopedic surgery.  He denies significant PND, orthopnea or lower extremity edema.  He does occasionally have a chest discomfort episode but he describes it as a stabbing sensation lasting 1 to 2 seconds.  He has some concerns about osteoporosis and asking whether he should be undergoing DEXA exams but is willing to discuss this with his oncology provider.    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 (Patient taking differently: Take 3 tablets (750 mg total) by mouth daily. 750mg  now) 120 tablet 11    calcium carbonate 300 mg (750 mg) Chew       cholecalciferol, vitamin D3-125 mcg, 5,000 unit,, 125 mcg (5,000 unit) tablet Take 1 tablet (125 mcg total) by mouth two (2) times a day.      fish oil-omega-3 fatty acids 300-1,000 mg capsule Take 2 capsules (2 g total) by mouth daily.      melatonin 3 mg Tab Take 1 tablet (3 mg total) by mouth nightly as needed.      multivitamin (TAB-A-VITE/THERAGRAN) per tablet Take 1 tablet by mouth daily.      tamsulosin (FLOMAX) 0.4 mg capsule Take 1 capsule (0.4 mg total) by mouth daily. 30 capsule 11    vitamin B complex vit C no.3 (VITAMIN B COMP AND C NO.3 ORAL) Take by mouth daily as needed.       vitamin D3-folic acid 125 mcg (5,000 unit)-1 mg Tab Take 125 mcg by mouth in the morning.      zinc gluconate 50 mg (7 mg elemental zinc) tablet Take 1 tablet (50 mg total) by mouth daily.      esomeprazole (NEXIUM) 20 MG capsule Take 1 capsule (20 mg total) by mouth in the morning. (Patient not taking: Reported on 10/07/2021)      loperamide HCl (IMODIUM A-D ORAL)  (Patient not taking: Reported on 10/14/2021)      olopatadine (PATADAY ONCE DAILY RELIEF) 0.7 % ophthalmic solution Administer 1 drop to both eyes daily. (Patient not taking: Reported on 10/21/2021)      predniSONE (DELTASONE) 5 MG tablet Take 1 tablet (5 mg total) by mouth daily. (Patient not taking: Reported on 10/25/2021) 30 tablet 11     No current facility-administered medications for this visit.       Allergies  Allergies   Allergen Reactions    Flonase [Fluticasone]      Nose bleed    Budesonide      Other reaction(s): Other (See Comments)  Epistaxis       Social History:   Social History     Tobacco Use    Smoking status: Some Days     Types: Cigars     Start date: 12/11/2016    Smokeless tobacco: Never       Family History:  No family history on file.    ROS- 12 system review is negative other than what is specified in the History of Present Illness.      Objective:   Physical Exam  Vitals:    01/31/22 0811   BP: 149/86   Pulse: 68   Resp: 18   Temp: 36 ??C (96.8 ??F)   SpO2: 97%   Weight: 89.3 kg (196 lb 14.4 oz)   Height: 175.3 cm (5' 9)       Wt Readings from Last 3 Encounters:   01/31/22 89.3 kg (196 lb 14.4 oz)   12/29/21 87.3 kg (192 lb 8 oz)   12/22/21 86.5 kg (190 lb 9.6 oz)     General-  Patient is well-appearing in no acute distress  Neurologic- Alert and oriented X3.  Cranial nerve II-XII grossly intact.  HEENT-  Normocephalic atraumatic head.  No scleral icterus.  Wearing face mask.  Neck- Supple, no carotid bruis, no JVD  Lungs- clear to auscultation, no wheezes, rhonchi, or rhales.  Heart-  RRR, no MRG.   Abdomen- Soft,  nontender, no organomegally.  Extremities-  No clubbing or cyanosis.  No lower extremity edema bilaterally.    Pulses- +2 pulses in radial and dorsalis pedis bilaterally.  Psych- Normal mood, appropriate.      Laboratory data:      Component Value Date/Time    PROBNP 298.0 (H) 12/12/2018 1632       Lab Results   Component Value Date    WBC 6.7 06/16/2021    HGB 15.6 06/16/2021    HCT 46.4 06/16/2021    PLT 193 06/16/2021       Lab Results   Component Value Date    NA 139 06/16/2021    K 4.3 01/31/2022    CL 103 06/16/2021    CO2 28.0 06/16/2021    BUN 22 06/16/2021    CREATININE 1.1 08/22/2021    GLU 89 06/16/2021    CALCIUM 10.2 06/16/2021    MG 1.9 12/12/2018    PHOS 3.2 12/12/2018       Lab Results   Component Value Date    TSH 3.920 (H) 12/12/2018    ALBUMIN 3.8 01/31/2022    ALT 24 01/31/2022    AST 28 01/31/2022         Electrocardiogram:  From 12/12/18 showed sinus tach w/ occasional PVC.    From 10/06/21 showed SR with frequent PVC in pattern of bigeminy, possible inferior infarct.     Ziopatch   Ambulatory ECG monitoring was performed from 12/13/18 to 12/27/18. The predominant rhythm was sinus rhythm, with the rate ranging from 44 to 141 and averaging 74 bpm when in sinus rhythm. Rare supraventricular ectopics (PACs) were recorded with 24 episodes of SVT (at least some of which appear to be atrial tachycardia), longest lasting 11.5 seconds at 121 bpm. Frequent ventricular ectopics (PVCs) were recorded (6.5% burden of isolated PVCs) with 11 episodes of wide-complex tachycardia, longest lasting 7 beats with rate 118 bpm. Patient-initiated recordings/events revealed sinus rhythm, PACs, PVCs (including bigeminy and trigeminy), and artifact. No pauses >3 seconds or high-grade AV block detected. No atrial fibrillation detected.    Echocardiogram:  From 12/12/18 showed overall normal left ventricular systolic function, ejection fraction 50 - 55%. Possible inferolateral hypokinesis, wall motion assessment is limited by study quality. Normal right ventricular systolic function. Tricuspid regurgitation - mild. Mitral valve prolapse - mild.    Nuclear stress test:  From 01/17/19 showed normal myocardial perfusion study. No evidence for significant ischemia or scar is noted.  Post stress: Global systolic function is normal. The ejection fraction calculated at 59%. Left ventricular chamber size is relatively small. No significant coronary calcifications were noted on the attenuation CT.    Lipid panel:  From 04/15/21 at [facility] showed:  Triglycerides- 75  Cholesterol- 190  HDL- 59.8  LDL- 115  Component      Latest Ref Rng 11/21/2021   LDL Direct      mg/dL 213.0

## 2022-01-31 NOTE — Unmapped (Signed)
[facility] Visit Note    Patient Name: Eric Good  Patient Age: 76 y.o.  Encounter Date: 01/31/2022  Attending Provider:  Kinnick Maus E. 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    Bettie Quebodeaux is a 76 y.o. man with newly diagnosed de novo metastatic prostate cancer, T2N1M1a, involving pelvic and retroperitoneal lymph nodes on conventional CT and bone scans. PSA being 45 and Gleason 4+5=9 disease also support this diagnosis.    I discussed with the patient the long term prognosis of prostate cancer and treatment options, such as androgen deprivation therapy, and benefit and side effects of ADT.  Side effects includes but are not limited to loss of libido, erectile dysfunction, hot flashes, fatigue, weight gain, metabolic syndrome that increases the risk of diabetes and cardiovascular events, osteoporosis, and reduced muscle mass.  I have counseled the patient on the need to take calcium and vitamin D as well as regular weight bearing exercise to minimize the side effects of ADT. It is expected that virtually all patients will develop progressive disease while on ADT and at that time additional treatment would be required, but ultimately, the patient may pass away from prostate cancer.    A series of recent studies demonstrate the benefit of additional therapy (docetaxel per CHAARTED and STAMPEDE, abiraterone/prednisone per LATITUTUDE, enzalutamide per ARCHES and ENZAMET, apalutamide per TITAN).  Therefore, the current option would be to add one of AR targeted agents. Docetaxel chemo would not be recommended for low volume patients.  I discussed the consideration of additional therapy and the benefit and side effects of an agent such as abiraterone.    On 06/16/21, patient returns after PSMA-PET, which showed uptake in the prostate, multiple retrocaval and left iliac lymph nodes, and the left scapular body, concerning for metastatic disease. We discussed the implications of these findings and re-discussed the risks and benefits of ADT as well as the possible benefit from the addition of an AR-targeted therapy. Patient elected today to start treatment with ADT.    In 07/2021, PSA down on ADT. After discussion of benefit/side effects, abiraterone and prednisone prescribed. Will get somatic tumor genomic testing. Also referral to radiation oncology.    In 08/2021, PSA is 1.33. His LFT's are okay. I recommend obtaining germline testing at his next visit. I also spoke to him about joining the [employer] study. Pt was agreeable to both.    In 10/2021, PSA is 0.39. His LFT's are slightly elevated.  We will continue to monitor LFT's every 2 weeks and hope to see improvement/normalization.  Otherwise tolerating ADT, abi, and pred well. I'll see him back in mid-June for his next dose of Eligard. He was also noted to have bradycardia on vitals.  EKG today without bradycardia but noted ventricular bigeminy/frequent PVCs.  He is asymptomatic today but did have an episode of chest pain for 20 minutes 4 nights ago.  We will have him see cardiology again.     In  01/2022, doing well. Nothing remarkable on somatic/germline testing. PSA, labs great. Continue with current treatment of ADT plus abiraterone.    Plan  1. Continue ADT with Eligard 45 mg subcutaneous, given last on 12/15/2021, due next on or after 06/13/2022 per therapy plan  -- Abiraterone 1000 mg every day and prednisone 5 mg daily. Follow LFT's, K, BP.  -- We do not recommend docetaxel chemo, since low volume disease.  -- Pt will have completed 2 years of therapy in 07/2023.  2. Completed IMRT to the prostate and pelvic nodes and SRBT to the left scapula and prostate on 09/05/2021.  3. Somatic tumor mutation profiling done, no targetable mutations  4. Germline testing negative  5. DEXA scan prior to next visit  5. Return in 4 months, in time for injection. Lab visit in 2 months.    I personally spent 40 minutes face-to-face and non-face-to-face in the care of this patient, which includes all pre, intra, and post visit time on the date of service.  All documented time was specific to the E/M visit and does not include any procedures that may have been performed.      Reason for Visit  Follow up of prostate cancer    History of Present Illness:  Oncology History Overview Note   In 04/2021, screening PSA of 49, then 45.3. DRE abnormal, firm at L base  In 05/2021, prostate biopsy showed Gleason 4+5=9, 6/12 cores involved. CT showed retroperitoneal and pelvic adenopathy. Bone scan showed single focus in L scapula, nonspecific.  In 06/2021, PSMA PET showed retroperitoneal/pelvic node mets, L scapular met.  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 Other than shoulder pain, which he thinks is due to muskuloskeletal issues, pt has been doing really well. Mild fatigue, mild hot flashes. Otherwise, no other issues.      Allergies:  Allergies   Allergen Reactions    Flonase [Fluticasone]      Nose bleed    Budesonide      Other reaction(s): Other (See Comments)  Epistaxis       Current Medications:    Current Outpatient Medications:     calcium carbonate 300 mg (750 mg) Chew, , Disp: ,  Rfl:     cholecalciferol, vitamin D3-125 mcg, 5,000 unit,, 125 mcg (5,000 unit) tablet, Take 1 tablet (125 mcg total) by mouth two (2) times a day., Disp: , Rfl:     esomeprazole (NEXIUM) 20 MG capsule, Take 1 capsule (20 mg total) by mouth in the morning., Disp: , Rfl:     fish oil-omega-3 fatty acids 300-1,000 mg capsule, Take 2 capsules (2 g total) by mouth daily., Disp: , Rfl:     loperamide HCl (IMODIUM A-D ORAL), , Disp: , Rfl:     melatonin 3 mg Tab, Take 1 tablet (3 mg total) by mouth nightly as needed., Disp: , Rfl:     multivitamin (TAB-A-VITE/THERAGRAN) per tablet, Take 1 tablet by mouth daily., Disp: , Rfl:     olopatadine (PATADAY ONCE DAILY RELIEF) 0.7 % ophthalmic solution, Administer 1 drop to both eyes daily., Disp: , Rfl:     predniSONE (DELTASONE) 5 MG tablet, Take 1 tablet (5 mg total) by mouth daily., Disp: 30 tablet, Rfl: 11    tamsulosin (FLOMAX) 0.4 mg capsule, Take 1 capsule (0.4 mg total) by mouth daily., Disp: 30 capsule, Rfl: 11    vitamin B complex vit C no.3 (VITAMIN B COMP AND C NO.3 ORAL), Take by mouth daily as needed. , Disp: , Rfl:     vitamin D3-folic acid 125 mcg (5,000 unit)-1 mg Tab, Take 125 mcg by mouth in the morning., Disp: , Rfl:     zinc gluconate 50 mg (7 mg elemental zinc) tablet, Take 1 tablet (50 mg total) by mouth daily., Disp: , Rfl:     abiraterone (ZYTIGA) 250 mg Tab tablet, Take 3 tablets (750 mg total) by mouth daily., Disp: 90 tablet, Rfl: 11    Past Medical History and Social History  Past Medical History:   Diagnosis Date    Rapid heart beat     Negative cardiac workup in 2020      No past surgical history on file.     Social History     Occupational History    Not on file   Tobacco Use    Smoking status: Some Days     Types: Cigars     Start date: 12/11/2016    Smokeless tobacco: Never   Substance and Sexual Activity    Alcohol use: Not on file    Drug use: Not on file    Sexual activity: Not on file       Family History    Prostate Cancer Family History Assessment:  History of cancer in children (yes/no; if yes, what type AND age of diagnosis): No  History of cancer in siblings (yes/no; if yes, provide relation, type of cancer, AND age of diagnosis): No  History of cancer in parents (yes/no; if yes, please specify parent, type of cancer, AND age of diagnosis): Yes, sister with stomach and eye cancer, age 20  History of cancer in aunts/uncles/grandparents (yes/no; if yes, provide relation, type of cancer, AND age of diagnosis): No      Review of Systems:  A comprehensive review of 10 systems was negative except for pertinent positives noted in HPI.    Physical Exam:    VITAL SIGNS:  BP 136/83  - Pulse 61  - Temp 36.9 ??C (98.4 ??F) (Temporal)  - Resp 16  - Ht 175.3 cm (5' 9)  - Wt 88.2 kg (194 lb 8 oz)  - SpO2 99%  - BMI 28.72 kg/m??   ECOG Performance Status: 1  GENERAL: Well-developed, well-nourished patient in no acute distress.  HEAD: Normocephalic and atraumatic.  EYES: Conjunctivae are normal. No scleral icterus.  PSYCHIATRIC: Alert and oriented.  Normal mood and affect.  NEUROLOGIC: Normal gait.         Results/Orders:      Appointment on 01/31/2022   Component Date Value Ref Range Status    PSA 01/31/2022 0.04  0.00 - 4.00 ng/mL Final    Potassium 01/31/2022 4.3  3.5 - 5.1 mmol/L Final    Albumin 01/31/2022 3.8  3.4 - 5.0 g/dL Final    Total Protein 01/31/2022 6.2  5.7 - 8.2 g/dL Final    Total Bilirubin 01/31/2022 0.9  0.3 - 1.2 mg/dL Final    Bilirubin, Direct 01/31/2022 0.40 (H)  0.00 - 0.30 mg/dL Final    AST 16/04/9603 28  <=34 U/L Final    ALT 01/31/2022 24  10 - 49 U/L Final    Alkaline Phosphatase 01/31/2022 59  46 - 116 U/L Final     Lab Results   Component Value Date    PSA 0.04 01/31/2022    PSA 0.08 01/02/2022    PSA 0.11 12/15/2021    PSA 0.11 12/05/2021    PSA 0.15 11/21/2021    PSA 0.21 11/07/2021         Orders placed or performed in visit on 01/31/22    Dexa Bone Density Skeletal    Clinic Appointment Request Physician, Lab    Clinic Appointment Request Physician, Lab, Injection    LAB Appointment Request    LAB Appointment Request           Molecular Pathology  Tumor mutation profiling on 08/01/18  TMB 0 m/Mb  MSS  APC  MTA  PTEN  CDKN2A/B  CIC  TP53    Germline testing [facility] on 09/12/21  Negative    Pathology  05/04/2021  [A] PROSTATE, LEFT BASE:   ACINAR ADENOCARCINOMA, GLEASON 4+3=7  (GG   3), INVOLVING 1 OF 1 CORES, MEASURING 5  MM ( 71%). 80% PATTERN 4;   CRIBRIFORM PATTERN 4 PRESENT    [B] PROSTATE, LEFT MID:   ACINAR ADENOCARCINOMA, GLEASON 4+3=7  (GG 3),   INVOLVING 1 OF 1 CORES, MEASURING 5  MM ( 83%). 80% PATTERN 4;   CRIBRIFORM PATTERN 4 PRESENT    [C] PROSTATE, LEFT APEX:   ACINAR ADENOCARCINOMA, GLEASON 4+3=7  (GG   3), INVOLVING 1 OF 2 CORES, MEASURING 4  MM ( 40%). 70% PATTERN 4;   CRIBRIFORM PATTERN 4 PRESENT    [D] PROSTATE, RIGHT BASE:   NEGATIVE FOR MALIGNANCY.    [E] PROSTATE, RIGHT MID:   NEGATIVE FOR MALIGNANCY.    [F] PROSTATE, RIGHT APEX:   NEGATIVE FOR MALIGNANCY.    [G] PROSTATE, LEFT LATERAL BASE:   ACINAR ADENOCARCINOMA, GLEASON 4+4=8   (GG 4), INVOLVING 1 OF 1 CORES, MEASURING 5  MM ( 50%). PERINEURAL   INVASION PRESENT    [H] PROSTATE, LEFT LATERAL MID:   ACINAR ADENOCARCINOMA, GLEASON 4+5=9 (GG 5), INVOLVING 1 OF 1 CORES, MEASURING 9  MM ( 100%). CRIBRIFORM   PATTERN 4 PRESENT    [I] PROSTATE, LEFT LATERAL APEX:   ACINAR ADENOCARCINOMA, GLEASON 4+3=7   (GG 3), INVOLVING 2 OF 2 CORES, MEASURING 8  MM ( 100%). 80% PATTERN 4;   CRIBRIFORM PATTERN 4 PRESENT; PERINEURAL INVASION PRESENT    [J] PROSTATE, RIGHT LATERAL BASE:   NEGATIVE FOR MALIGNANCY.    [K] PROSTATE, RIGHT LATERAL MID:  NEGATIVE FOR MALIGNANCY.    [L] PROSTATE, RIGHT LATERAL APEX:   NEGATIVE FOR MALIGNANCY.     Imaging results:  CT AP 05/19/2021  LYMPH NODES: Retroperitoneal and pelvic adenopathy with index lesions as follows  --1.5 cm retrocaval lymph node  -- 1.4 cm left common iliac lymph node.  --1.9 cm left external iliac lymph node  --1.8 cm left internal iliac lymph  node  IMPRESSION:  Retroperitoneal and  left pelvic adenopathy, suspicious for metastatic disease.     Heterogeneous appearance of the prostate, consistent with known neoplasm.     Indeterminate lesion in the right posterior 12th rib. Recommend correlation with concurrent bone scan.     Mildly hyperenhancing right renal lesion, suspicious for cortical neoplasm. Recommend correlation with prior imaging if available. If not, MRI of the abdomen with and without contrast may be performed for definitive characterization.    Bone scan 05/19/2021  Focal area of increased radiotracer uptake within the left scapula which could represent osseous metastasis or remote trauma, indeterminate      PET CT Skull Base to Thigh 06/13/2021  -Mild radiotracer uptake within the prostate, likely corresponding to patient's known neoplasm.   -Multiple radiotracer avid retrocaval and left iliac lymph nodes, as described above, concerning for metastatic disease. Tiny right iliac/pelvic sidewall lymph nodes with no significant uptake, indeterminate.   -Radiotracer uptake within the left scapular body, corresponding to increased tracer uptake on recent bone scan, concerning for osseous metastatic disease.

## 2022-02-06 NOTE — Unmapped (Signed)
[facility] Clinical Assessment & Refill Coordination Note    Eric Good, DOB: 11-28-1945  Phone: [phone] (home)     All above HIPAA information was verified with patient.     Was a Nurse, learning disability used for this call? No    Specialty Medication(s):   Hematology/Oncology: abiraterone 250 mg     Current Outpatient Medications   Medication Sig Dispense Refill    abiraterone (ZYTIGA) 250 mg Tab tablet Take 3 tablets (750 mg total) by mouth daily. 90 tablet 11    calcium carbonate 300 mg (750 mg) Chew       cholecalciferol, vitamin D3-125 mcg, 5,000 unit,, 125 mcg (5,000 unit) tablet Take 1 tablet (125 mcg total) by mouth two (2) times a day.      esomeprazole (NEXIUM) 20 MG capsule Take 1 capsule (20 mg total) by mouth in the morning.      fish oil-omega-3 fatty acids 300-1,000 mg capsule Take 2 capsules (2 g total) by mouth daily.      loperamide HCl (IMODIUM A-D ORAL)       melatonin 3 mg Tab Take 1 tablet (3 mg total) by mouth nightly as needed.      multivitamin (TAB-A-VITE/THERAGRAN) per tablet Take 1 tablet by mouth daily.      olopatadine (PATADAY ONCE DAILY RELIEF) 0.7 % ophthalmic solution Administer 1 drop to both eyes daily.      predniSONE (DELTASONE) 5 MG tablet Take 1 tablet (5 mg total) by mouth daily. 30 tablet 11    tamsulosin (FLOMAX) 0.4 mg capsule Take 1 capsule (0.4 mg total) by mouth daily. 30 capsule 11    vitamin B complex vit C no.3 (VITAMIN B COMP AND C NO.3 ORAL) Take by mouth daily as needed.       vitamin D3-folic acid 125 mcg (5,000 unit)-1 mg Tab Take 125 mcg by mouth in the morning.      zinc gluconate 50 mg (7 mg elemental zinc) tablet Take 1 tablet (50 mg total) by mouth daily.       No current facility-administered medications for this visit.        Changes to medications: Brenin reports starting the following medications: Aleve (naproxen).  Discussed potential DDI with prednisone.  Both medications can affect platelet levels and potentially thin the blood.  Discussed take both naproxen and prednisone with food and to monitor for any signs of unusual bleeding or bruising.    Allergies   Allergen Reactions    Flonase [Fluticasone]      Nose bleed    Budesonide      Other reaction(s): Other (See Comments)  Epistaxis       Changes to allergies: No    SPECIALTY MEDICATION ADHERENCE     Abiraterone 250 mg: 12 days of medicine on hand     Medication Adherence    Patient reported X missed doses in the last month: 0  Specialty Medication: abiraterone 250 mg - 3 tabs (750 mg) once daily  Patient is on additional specialty medications: No  Informant: patient                  Confirmed plan for next specialty medication refill: delivery by pharmacy  Refills needed for supportive medications: not needed          Specialty medication(s) dose(s) confirmed:  Dose decreased from abiraterone 1,000 mg daily to 750 mg once daily      Are there any concerns  with adherence? No    Adherence counseling provided? Not needed    CLINICAL MANAGEMENT AND INTERVENTION      Clinical Benefit Assessment:    Do you feel the medicine is effective or helping your condition? Yes    Clinical Benefit counseling provided? Not needed    Adverse Effects Assessment:    Are you experiencing any side effects? Yes, patient reports experiencing hot flashes. Side effect counseling provided: NA - has been experiencing/not new side effect    Are you experiencing difficulty administering your medicine? No    Quality of Life Assessment:    Quality of Life    Rheumatology  Oncology  1. What impact has your specialty medication had on the reduction of your daily pain or discomfort level?: None  2. On a scale of 1-10, how would you rate your ability to manage side effects associated with your specialty medication? (1=no issues, 10 = unable to take medication due to side effects): 2  Dermatology  Cystic Fibrosis          How many days over the past month did your prostate cancer  keep you from your normal activities? For example, brushing your teeth or getting up in the morning. 0    Have you discussed this with your provider? Not needed    Acute Infection Status:    Acute infections noted within Epic:  No active infections  Patient reported infection: None    Therapy Appropriateness:    Is therapy appropriate and patient progressing towards therapeutic goals? Yes, therapy is appropriate and should be continued    DISEASE/MEDICATION-SPECIFIC INFORMATION      N/A    PATIENT SPECIFIC NEEDS     Does the patient have any physical, cognitive, or cultural barriers? No    Is the patient high risk? Yes, patient is taking oral chemotherapy. Appropriateness of therapy as been assessed    Does the patient require a Care Management Plan? No     SOCIAL DETERMINANTS OF HEALTH     At the [facility], we have learned that life circumstances - like trouble affording food, housing, utilities, or transportation can affect the health of many of our patients.   That is why we wanted to ask: are you currently experiencing any life circumstances that are negatively impacting your health and/or quality of life? No    Social Determinants of Psychologist, prison and probation services Strain: Not on file   Internet Connectivity: Not on file   Food Insecurity: Not on file   Tobacco Use: High Risk (02/03/2022)    Patient History     Smoking Tobacco Use: Some Days     Smokeless Tobacco Use: Never     Passive Exposure: Not on file   Housing/Utilities: Not on file   Alcohol Use: Not on file   Transportation Needs: Not on file   Substance Use: Not on file   Health Literacy: Not on file   Physical Activity: Not on file   Interpersonal Safety: Not on file   Stress: Not on file   Intimate Partner Violence: Not on file   Depression: Not on file   Social Connections: Not on file     Would you be willing to receive help with any of the needs that you have identified today? Not applicable     SHIPPING     Specialty Medication(s) to be Shipped:   Hematology/Oncology: abiraterone 250 mg    Other medication(s) to be shipped:  none - prednisone from local pharmacy     Changes to insurance: No    Delivery Scheduled: Yes, Expected medication delivery date: 02/09/22.     Medication will be delivered via Next Day Courier to the confirmed prescription address in [facility].    The patient will receive a drug information handout for each medication shipped and additional FDA Medication Guides as required.  Verified that patient has previously received a Conservation officer, historic buildings and a Surveyor, mining.    The patient or caregiver noted above participated in the development of this care plan and knows that they can request review of or adjustments to the care plan at any time.      All of the patient's questions and concerns have been addressed.    Breck Coons Shared [facility] Specialty Pharmacist

## 2022-02-15 NOTE — Unmapped (Signed)
I spoke with patient Eric Good to confirm appointments on the following date(s): 04/03/22 for 9a labs in [facility].       [name],    Pt stated you instructed him to get labs done at the end of the month, will he still need those?    [name]

## 2022-02-16 ENCOUNTER — Ambulatory Visit: Admit: 2022-02-16 | Discharge: 2022-02-17 | Payer: MEDICARE

## 2022-02-16 MED ORDER — CELECOXIB 100 MG CAPSULE
ORAL_CAPSULE | Freq: Every day | ORAL | 2 refills | 30 days | Status: CP
Start: 2022-02-16 — End: 2023-02-16

## 2022-02-16 NOTE — Unmapped (Signed)
SPORTS MEDICINE NEW VISIT    ASSESSMENT AND PLAN      Diagnosis ICD-10-CM Associated Orders   1. Nontraumatic tear of left rotator cuff, unspecified tear extent  M75.102       2. 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 Reuben Staude is a 76 y.o. male who presents for left shoulder pain.     We had a good discussion with [place] today.  He has had ongoing left shoulder pain for a few months now after completing radiation therapy for a scapular lesion.  We discussed our recommendation for initial conservative treatments.  We recommend treatment with anti-inflammatories and prescribed him Celebrex today.  We also provided him with a home exercise regimen to work on range of motion and strengthening at home.  He will contact us if he desires a formal physical therapy referral.  We discussed that if these interventions do not help, we can consider a corticosteroid injection in the future.      No follow-ups on file.    Procedure(s):  None      SUBJECTIVE     Chief Complaint: Left shoulder pain     History of Present Illness: 76 y.o. male who presents for evaluation of his left shoulder.  He initially had an episode of left shoulder pain around 12 years ago.  He received a corticosteroid injection at that time which resolved his symptoms completely.  He had no further issues with his left shoulder until recently.  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  A bone lesion was found in his scapula for which she received radiation therapy that was completed in April 2023.  Since radiation was completed, he has had ongoing left shoulder pain.  Pain with overhead activities.  Limiting his ability to participate in all desired activities.  He has not yet tried physical therapy or injections.  He continues to undergo treatment for his prostate cancer.  Right-hand-dominant.    Past Medical History:   Past Medical History:   Diagnosis Date    Rapid heart beat     Negative cardiac workup in 2020       Past Surgical History:  has no past surgical history on file.      OBJECTIVE     Physical Exam:  Vitals:   Wt Readings from Last 3 Encounters:   01/31/22 88.2 kg (194 lb 8 oz)   01/31/22 89.3 kg (196 lb 14.4 oz)   12/29/21 87.3 kg (192 lb 8 oz)     Estimated body mass index is 28.72 kg/m?? as calculated from the following:    Height as of 01/31/22: 175.3 cm (5' 9).    Weight as of 01/31/22: 88.2 kg (194 lb 8 oz).  Gen: Well-appearing male in no acute distress  MSK: Left shoulder -  Appearance: No deformity, hypertophy   Tenderness: TTP at biceps, nontender at [facility]   Range of motion: FE 100, ER 50  Strength: 5-/5 supra/infra strength, 5/5 abd/ER, 5/5 IR   Provocative maneuvers: +Jobe, -belly press  Neurovascularly intact.    Imaging/other tests:   Left shoulder radiographs ordered and independently interpreted today by Dr. Olga Millers.  These demonstrate no acute bony abnormality.  No significant arthritic changes.  CT scan 12/27/2021 demonstrates a small inferolateral cortical scapular lesion consistent with known metastatic disease.  Previously treated with radiation therapy.      ADMINISTRATIVE     I have personally reviewed and interpreted the images (as available).  I have personally reviewed prior records and incorporated relevant information above (as available).    @[email or web address] @    PATIENT PROFILE     Practice: New to provider  Plan: continue conservative management     PROCEDURES     Procedures     DME     DME ORDER:  Dx:  ,                     ----------------------------------------------------------------------------------------------------------------------  February 16, 2022 3:33 PM. Documentation assistance provided by the Scribe. I was present during the time the encounter was recorded. The information recorded by the Scribe was done at my direction and has been reviewed and validated by me.  ----------------------------------------------------------------------------------------------------------------------

## 2022-02-16 NOTE — Unmapped (Signed)
Home Exercise Program  Rotator Cuff Strengthening      Goals of Home Exercise Program:  Strengthen the four rotator cuff muscles that support the shoulder (supraspinatus, infraspinatus, teres minor and subscapularis muscles).  Increase stability of shoulder.  Decrease pain.    Please note:  Exercises are not always comfortable and may cause soreness and discomfort. However, they should not cause sharp pain. Please consult the athletic trainer associated with your provider if you have any questions or exercises that are causing a significant amount of pain. However, please also know that all rehab needs slight discomfort in order to provide the effect necessary.      Warm Up:    In order to warm up the tissues and make them more pliable for exercises, try heating the area for 10-15 minutes with a heating pad, warm wet towel, or a hot shower.      Goal 1: Strengthen the 4 Rotator Cuff Muscles    Veggie Can Lifts (T???s, Y???s and I???s):       While standing and your arms by your side. Slowly raise your arm up, raise only up to shoulder level. Begin without weight, and to make more challenging, perform while holding a can of veggies.     Repeat 10 Times   Hold 1 Sec   Complete 3 Sets   Perform 1x/Day             While standing and your arms by your side. Slowly raise your arm up in a forward direction, raise only up to shoulder level. Begin without weight, and to make more challenging, perform while holding a can of veggies.     Repeat 10 Times   Hold 1 Sec   Complete 3 Sets   Perform 1x/Day             While lying at the edge of a bed or table, lift your arm at a 45 degree angle, keeping your thumb facing upward. Lift to shoulder height and lower back down for each repetition. Begin without weight, and to make more challenging, perform while holding a can of veggies.     Repeat 10 Times   Hold 1 Sec   Complete 3 Sets   Perform 1x/Day          Internal and External Rotation with Bands:       While holding an elastic band at your side with your elbow bent, start with your hand near your stomach and then pull the band away. Keep your elbow at your side the entire time.     Repeat 10 Times   Hold 1 Sec   Complete 3 Sets   Perform 1x/Day           While holding an elastic band at your side with your elbow bent, start with your hand away from your stomach and then pull the band towards your stomach. Keep your elbow at your side the entire time.     Repeat 10 Times   Hold 1 Sec   Complete 3 Sets   Perform 1x/Day          Side Lying External Rotation:       Lie on your unaffected side and bend your elbow to 90 degrees with a rolled towel under your elbow as shown. Roll your hand from your stomach towards the ceiling and slowly back to your stomach. Begin without weight. You can make more challenging by holding a can of veggies.  Repeat 10 Times   Hold 1 Sec   Complete 3 Sets   Perform 1x/Day            Goal 2: Increase Stability of  the Shoulder    Shoulder Shrugs:       While standing and your arms by your side, shrug your shoulders and hold for 3 seconds. You can begin without weight and when ready to progress, hold a can of veggies or small weight.      Repeat 10 Times   Hold 3 Sec   Complete 3 Sets   Perform 1x/Day        Standing Row with Bands:         While standing and your arms straight out in front of you, grasp the band. Slowly pull the band directly backwards, bending your elbow in a row motion.      Repeat 10 Times   Hold 3 Sec   Complete 3 Sets   Perform 1x/Day          Y???s Exercise:     While lying at the edge of a bed or table, lift your arm at a 45 degree angle, keeping your thumb facing upward. Lift to shoulder height and lower back down for each repetition. Begin without weight, and to make more challenging, perform while holding a can of veggies.     Repeat 10 Times   Hold 1 Sec   Complete 3 Sets   Perform 1x/Day        Serratus Punches:     While lying on a bed with your arm straight towards the ceiling, ???punch??? your arm more forward while keeping your shoulders still on the bed. Lower back down.     Repeat 10 Times   Hold 1 Sec   Complete 3 Sets   Perform 1x/Day        Wall Push Ups:     While leaning into a wall with your arms straight in front of you, bend your elbows to make a push up motion, bringing your face towards the wall. Then, push back outwards to your starting position.     Repeat 10 Times   Hold 1 Sec   Complete 3 Sets   Perform 1x/Day                                Reverse Pendulums:     Lying on your back, straighten your arm towards the ceiling. Next, move your arm in small circles in a clockwise motion. After a few seconds, reverse the direction. Change directions every few seconds.     Repeat 5 times   Complete 30 seconds   Perform 1x/Day               Place a folded towel on a wall or door with a slick surface. Stand with your arm directly to your side. Slide the towel in a circular pattern. Reverse directions every 5 circles.     Repeat 5 Times   Complete 30 circles   Perform 1x/Day            Goal 3: Decrease Pain    Icing:    Icing is really important after exercises to decrease your discomfort and increase your likelihood of completing them again in the future. Inflammation in the area is likely and icing the area keeps this minimal. You can ice with ice in  a Ziploc bag, a bag of frozen peas (as long as you don???t later eat them), or by purchasing a reusable flexible ice pack. It is important to ice for 15-20 minutes at a time. After icing, do not reapply ice within the hour, as you can cause adverse effects.    Tylenol:    If cleared by your doctor, taking Tylenol (Acetaminophen) is a great way to reduce pain and make exercises more comfortable.

## 2022-02-18 NOTE — Unmapped (Signed)
I performed a history and physical examination of the patient and   discussed the patient's management with the Resident. I reviewed   the Resident's note and agree with the documented findings and plan   of care.

## 2022-02-27 ENCOUNTER — Ambulatory Visit: Admit: 2022-02-27 | Discharge: 2022-02-28 | Payer: MEDICARE

## 2022-02-27 LAB — HEPATIC FUNCTION PANEL
ALBUMIN: 3.5 g/dL (ref 3.4–5.0)
ALKALINE PHOSPHATASE: 58 U/L (ref 46–116)
ALT (SGPT): 30 U/L (ref 10–49)
AST (SGOT): 23 U/L (ref <=34)
BILIRUBIN DIRECT: 0.3 mg/dL (ref 0.00–0.30)
BILIRUBIN TOTAL: 0.8 mg/dL (ref 0.3–1.2)
PROTEIN TOTAL: 5.9 g/dL (ref 5.7–8.2)

## 2022-02-27 LAB — PSA: PROSTATE SPECIFIC ANTIGEN: <0.04 ng/mL (ref 0.00–4.00)

## 2022-02-27 LAB — POTASSIUM: POTASSIUM: 4.5 mmol/L (ref 3.5–5.1)

## 2022-03-08 NOTE — Unmapped (Signed)
Eric Good    Specialty Medication(s) to be Shipped:   Hematology/Oncology: abiraterone 250mg     Other medication(s) to be shipped: No additional medications requested for fill at this time     Eric Good, DOB: 09-23-1945  Phone: [phone] (home)       All above HIPAA information was verified with patient.     Was a Nurse, learning disability used for this call? No    Completed refill call assessment today to schedule patient's medication shipment from the [facility] [phone]).  All relevant notes have been reviewed.     Specialty medication(s) and dose(s) confirmed: Regimen is correct and unchanged.   Changes to medications: Eric Good reports starting the following medications: Celebrex  Changes to insurance: No  New side effects reported not previously addressed with a pharmacist or physician: None reported  Questions for the pharmacist: No    Confirmed patient received a Conservation officer, historic buildings and a Surveyor, mining with first shipment. The patient will receive a drug information handout for each medication shipped and additional FDA Medication Guides as required.       DISEASE/MEDICATION-SPECIFIC INFORMATION        N/A    SPECIALTY MEDICATION ADHERENCE     Medication Adherence    Patient reported X missed doses in the last month: 0  Specialty Medication: Abiraterone 250 mg  Patient is on additional specialty medications: No  Informant: patient                       Were doses missed due to medication being on hold? No    Abiraterone 250 mg: 11 days of medicine on hand     REFERRAL TO PHARMACIST     Referral to the pharmacist: Not needed      [facility]     Shipping address confirmed in Epic.     Delivery Scheduled: Yes, Expected medication delivery date: 03/15/22.     Medication will be delivered via Next Day Courier to the prescription address in Epic [place].    Eric Good   [facility] Specialty Technician

## 2022-03-14 MED FILL — ABIRATERONE 250 MG TABLET: ORAL | 30 days supply | Qty: 90 | Fill #1

## 2022-03-30 ENCOUNTER — Ambulatory Visit: Admit: 2022-03-30 | Discharge: 2022-03-31 | Payer: MEDICARE

## 2022-03-30 NOTE — Unmapped (Signed)
SPORTS MEDICINE RETURN VISIT    ASSESSMENT      Eric Good is a 76 y.o. male  with:  1. Left shoulder pain    PLAN:  --We had a good discussion with Molly Maduro today. He has had ongoing left atraumatic shoulder and scapular pain for several months now after completing radiation therapy for a prostate cancer metastasis to the scapula.  We initially saw him on 8/17 at which time he was prescribed Celebrex and was provided a home exercise regimen to work on range of motion and strengthening.  Since that visit, his pain has worsened and he feels as if his shoulder motion has decreased.  We discussed with him that his exam is consistent with two sources of pain, one likely the rotator cuff and the other being his scapula. He has significant tenderness to palpation of the scapular body on the left. Further, he has weakness with abduction and external rotation of the left shoulder and has a positive Jobe's and drop arm test. He has anterolateral shoulder pain with his extremes of motion particularly combined abduction/external rotation. We discussed that in regards to his rotator cuff we could provide him with a steroid injection today and see how he does. After discussion of risks and benefits, at the patient's request, we provided him with a left shoulder corticosteroid injection. He tolerated the injection without complication.  If his symptoms are not significantly alleviated we could pursue advanced imaging or formal PT. Further, in regards to his scapular lesion, we discussed we would like him to see Dr. Darral Dash.   --Imaging findings, further diagnostic and therapeutic options were reviewed with the patient, along with the benefits and drawbacks of each, and the patient expressed understanding    Prescriptions today: None    Follow-up: TBD    X-rays at next visit:  None      SUBJECTIVE     History of Present Illness: 76 y.o. male who presents for repeat evaluation of his left shoulder. He was seen for initial evaluation on 02/16/2022 for left shoulder arthritis, at which time he was prescribed Celebrex and was provided a home exercise regimen to work on range of motion and strengthening. 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  A bone lesion was found in his scapula for which she received radiation therapy that was completed in April 2023.  Since radiation was completed, he has had ongoing left shoulder pain.  Pain with overhead activities.  Limiting his ability to participate in all desired activities. He has an upcoming appointment with his oncologist in December.      Medical History  Past Medical History:   Diagnosis Date    Rapid heart beat     Negative cardiac workup in 2020    Surgical History  No past surgical history on file. Medications  has a current medication list which includes the following prescription(s): abiraterone, calcium carbonate, celecoxib, cholecalciferol (vitamin d3-125 mcg (5,000 unit)), esomeprazole, fish oil-omega-3 fatty acids, loperamide hcl, melatonin, multivitamin, pataday once daily relief, prednisone, tamsulosin, vitamin b complex vit c no.3, vitamin d3-folic acid, and zinc gluconate.   Tobacco/Alcohol History  Social History     Tobacco Use   Smoking Status Some Days    Types: Cigars    Start date: 12/11/2016   Smokeless Tobacco Never     Social History     Substance and Sexual Activity   Alcohol Use None    Social History  Occupational History    Not on file       Home Address:  [address]  [place] [address] Family History  No family history on file.     Allergies   Flonase [fluticasone] and Budesonide       Review of Systems  .   Marland Kitchen  No chest pain, dyspnea, nausea, fevers, chills         OBJECTIVE     Physical Exam:    DETAILED PHYSICAL EXAM  General Appearance well-nourished and no acute distress   Vitals Estimated body mass index is 28.72 kg/m?? as calculated from the following:    Height as of 01/31/22: 175.3 cm (5' 9).    Weight as of 01/31/22: 88.2 kg (194 lb 8 oz).   Mood and Affect alert, cooperative and pleasant       Cardiovascular well-perfused distally and no swelling         MUSCULOSKELETAL   LEFT:      SHOULDER: Range of motion: FE 100, ER 30, IR to SI joint  Strength:Weakness with ER and abduction  Provocative tests/ tenderness: Significant TTP scapular body, Positive Jobe's and drop arm test, negative Hornblowers        Imaging/other tests: Left shoulder radiographs independently reviewed.  These demonstrate no acute bony abnormality. No significant arthritic changes.  CT scan 12/27/2021 demonstrates a small inferolateral cortical scapular lesion consistent with known metastatic disease.    Orthopaedic PROMIS:          02/16/2022 03/30/2022   [facility]   Upper Extremity Physical Function CAT Score 22 10   Pain Interference CAT Score 22 36       PRO Status:  Patient completed PRO.      ADMINISTRATIVE     I have personally reviewed and interpreted the images (as available).  I have personally reviewed prior records and incorporated relevant information above (as available).      PATIENT PROFILE     Practice: Established to provider  Plan: Continued conservative management     PROCEDURES     Procedure  After discussion of risks and benefits, at the patient's request, we provided him with a Left shoulder corticosteroid injection.  Under standard sterile conditions, the affected shoulder was injected with a mixture of 4 mL bupivacaine and 1 mL of 40 mg/mL Kenalog.  He tolerated the injection without complication.       DME     DME ORDER:  Dx:  ,                     ______________________________________________________________________    Documentation assistance was provided by Kelle Darting, Scribe, on 03/30/2022 at 1:58 PM for Dr. Olga Millers, Queen Blossom, MD     ----------------------------------------------------------------------------------------------------------------------  March 30, 2022 1:58 PM. Documentation assistance provided by the Scribe. I was present during the time the encounter was recorded. The information recorded by the Scribe was done at my direction and has been reviewed and validated by me.  ----------------------------------------------------------------------------------------------------------------------

## 2022-04-03 ENCOUNTER — Ambulatory Visit: Admit: 2022-04-03 | Discharge: 2022-04-04 | Payer: MEDICARE

## 2022-04-03 LAB — HEPATIC FUNCTION PANEL
ALBUMIN: 3.7 g/dL (ref 3.4–5.0)
ALKALINE PHOSPHATASE: 59 U/L (ref 46–116)
ALT (SGPT): 24 U/L (ref 10–49)
AST (SGOT): 25 U/L (ref <=34)
BILIRUBIN DIRECT: 0.2 mg/dL (ref 0.00–0.30)
BILIRUBIN TOTAL: 0.7 mg/dL (ref 0.3–1.2)
PROTEIN TOTAL: 6.4 g/dL (ref 5.7–8.2)

## 2022-04-03 LAB — PSA: PROSTATE SPECIFIC ANTIGEN: 0.06 ng/mL (ref 0.00–4.00)

## 2022-04-03 LAB — POTASSIUM: POTASSIUM: 4.2 mmol/L (ref 3.5–5.1)

## 2022-04-05 ENCOUNTER — Ambulatory Visit: Admit: 2022-04-05 | Discharge: 2022-04-06 | Payer: MEDICARE

## 2022-04-05 NOTE — Unmapped (Signed)
ORTHOPAEDIC ONCOLOGY NEW CLINIC NOTE  Date: 04/05/2022   [facility]    Primary Care Physician: Gracelyn Nurse, MD  Referred from Dr. Olga Millers clinic    ASSESSMENT:  Eric Good is a 76 y.o. male with the following visit diagnoses:    ICD-10-CM   1. Bone lesion  M89.9   Orthopaedic notes: No specialty comments available.    PLAN:  We had a good discussion today with Mr. Pricilla Good about his left shoulder symptoms. He has both rotator cuff pathology as well as a separate lesion affecting his scapula. Given his increasing left scapular pain we think it is reasonable to proceed with a left scapular MRI to better understand the nature of his scapula lesion. The patient was in agreement and wished to proceed.  We will schedule this MRI and he will follow-up afterwards to review the results.    Scheduling Notes:  Patient will follow-up after obtaining his left scapula MRI.  - Xrays next visit: none    SUBJECTIVE:  Chief Complaint: Other of the Left Shoulder (Eval left shoulder in back)    History of Present Illness:   (>4: location, quality, severity, timing, duration, context, modifying factors, associated symptoms)  76 y.o. male with a history of prostate cancer with known met to the left scapula, who is currently on chemotherapy and underwent 5 and half weeks of radiation from March to April of this year.  The patient received radiation to both his prostate and his left scapula, and is currently undergoing chemotherapy.  The patient presents today for left shoulder pain. He notes his shoulder pain started 2.5 weeks after completion of his radiation treatment for the known scapula lesion. However his scapular pain started in the last 2 weeks. He reports he has pain both in the anterior shoulder as well as the angle of his scapula. He was recently seen in clinic with Dr. Olga Millers on 03/31/2022 for left shoulder pain and received a left rotator cuff corticosteroid injection.  He reports this relieved approximately 60% of his symptoms. He has also participated in physical therapy for the shoulder, but did not have significant relief. He notes his shoulder pain is worse with activity, and he feels like there is a knot or fullness in his scapula that is tender. Denies numbness of tingling in the left hand  Laterality: left  Pain Assessment: 0-10  0-10 Pain Scale: 2  Pain Location: Shoulder  Pain Orientation: Left    Review of Systems  .   Marland Kitchen   Medical History He has a past medical history of Rapid heart beat.   Surgical History He has no past surgical history on file.   Allergies Flonase [fluticasone] and Budesonide   Medications He has a current medication list which includes the following prescription(s): abiraterone, calcium carbonate, celecoxib, cholecalciferol (vitamin d3-125 mcg (5,000 unit)), esomeprazole, fish oil-omega-3 fatty acids, loperamide hcl, melatonin, multivitamin, pataday once daily relief, prednisone, tamsulosin, vitamin b complex vit c no.3, vitamin d3-folic acid, and zinc gluconate.   Family History His family history is not on file.   Social History He reports that he has been smoking cigars. He started smoking about 5 years ago. He has never used smokeless tobacco.Home [address]  [name] [address]        Occupational History   ??? Not on file           OBJECTIVE:  DETAILED PHYSICAL EXAM (12 Point)  General Appearance ?? well-nourished, in no  acute distress.   Mood and Affect ?? alert, cooperative and pleasant.   Gait and Station ?? neutral standing alignment.   Cardiovascular ?? well-perfused distally and no swelling.   Sensation ?? Sensation intact to light touch distally.    MUSCULOSKELETAL          LUE  Inspection/palpation  Range of motion  Stability  Strength  Skin ??? Inspection/palpation:  No swelling, erythema, deformity, atrophy or hypertrophy noted. He is tender over the inferior scapular body.  ??? Range of motion:  FF to 100, Abd to 110, can IR to L4  ??? Weakness with resisted ER, Abd  ??? Positive Jobe, positive Hornblowers     Test Results  Imaging  XR of the left shoulder and CT scan of the left scapula were reviewed.   Plain films show no osseous lesions  CT shows stable cortical defect/lesion      Orders Placed This Encounter   Procedures   ??? MRI Upper Extremity Non-Joint Left W Wo Contrast     Requested Prescriptions      No prescriptions requested or ordered in this encounter        Note created by Eli Phillips, MD, April 05, 2022 11:02 AM

## 2022-04-06 NOTE — Unmapped (Signed)
I performed a history and physical examination of the patient and   discussed the patient's management with the Resident. I reviewed   the Resident's note and agree with the documented findings and plan   of care.

## 2022-04-10 NOTE — Unmapped (Signed)
[facility] Refill Coordination Note    Specialty Medication(s) to be Shipped:   Hematology/Oncology: abiraterone 250mg     Other medication(s) to be shipped: No additional medications requested for fill at this time     Eric Good, DOB: Oct 27, 1945  Phone: [phone] (home)       All above HIPAA information was verified with patient.     Was a Nurse, learning disability used for this call? No    Completed refill call assessment today to schedule patient's medication shipment from the [facility] [phone]).  All relevant notes have been reviewed.     Specialty medication(s) and dose(s) confirmed: Regimen is correct and unchanged.   Changes to medications: Eric Good reports starting the following medications: Celebrex  Changes to insurance: No  New side effects reported not previously addressed with a pharmacist or physician: None reported  Questions for the pharmacist: No    Confirmed patient received a Conservation officer, historic buildings and a Surveyor, mining with first shipment. The patient will receive a drug information handout for each medication shipped and additional FDA Medication Guides as required.       DISEASE/MEDICATION-SPECIFIC INFORMATION        N/A    SPECIALTY MEDICATION ADHERENCE     Medication Adherence    Patient reported X missed doses in the last month: 0  Specialty Medication: Abiraterone 250 mg  Patient is on additional specialty medications: No  Patient is on more than two specialty medications: No  Any gaps in refill history greater than 2 weeks in the last 3 months: no  Informant: patient                 Were doses missed due to medication being on hold? No    Abiraterone 250 mg: 8 days of medicine on hand     REFERRAL TO PHARMACIST     Referral to the pharmacist: Not needed      [facility]     Shipping address confirmed in Epic.     Delivery Scheduled: Yes, Expected medication delivery date: 10//12/23 .     Medication will be delivered via Next Day Courier to the prescription address in Epic WAM.    Eric Good   [facility] Specialty Technician

## 2022-04-12 MED FILL — ABIRATERONE 250 MG TABLET: ORAL | 30 days supply | Qty: 90 | Fill #2

## 2022-04-13 ENCOUNTER — Ambulatory Visit
Admit: 2022-04-13 | Discharge: 2022-04-14 | Payer: MEDICARE | Attending: Student in an Organized Health Care Education/Training Program | Primary: Student in an Organized Health Care Education/Training Program

## 2022-04-13 ENCOUNTER — Ambulatory Visit
Admit: 2022-04-13 | Payer: MEDICARE | Attending: Student in an Organized Health Care Education/Training Program | Primary: Student in an Organized Health Care Education/Training Program

## 2022-04-13 NOTE — Unmapped (Signed)
04/13/2022    Rx:Lt Scapula: 09/07/2021: 3,000/3,000 cGy  ZO:[id number]: 10/27/2021: 7,000/7,000 cGy      Subjective/Assessment/Recommendations:    1. Fatigue: no issues  2. Pain: shoulder and shoulder  3. Elimination: no issues  4. Prescription Needs: none  5. Psychosocial: arrived alone

## 2022-04-24 ENCOUNTER — Ambulatory Visit: Admit: 2022-04-24 | Discharge: 2022-04-25 | Payer: MEDICARE

## 2022-04-24 LAB — HEPATIC FUNCTION PANEL
ALBUMIN: 3.6 g/dL (ref 3.4–5.0)
ALKALINE PHOSPHATASE: 57 U/L (ref 46–116)
ALT (SGPT): 29 U/L (ref 10–49)
AST (SGOT): 26 U/L (ref <=34)
BILIRUBIN DIRECT: 0.2 mg/dL (ref 0.00–0.30)
BILIRUBIN TOTAL: 0.7 mg/dL (ref 0.3–1.2)
PROTEIN TOTAL: 6 g/dL (ref 5.7–8.2)

## 2022-04-24 LAB — PSA: PROSTATE SPECIFIC ANTIGEN: 0.05 ng/mL (ref 0.00–4.00)

## 2022-04-24 LAB — POTASSIUM: POTASSIUM: 4.5 mmol/L (ref 3.5–5.1)

## 2022-05-02 ENCOUNTER — Ambulatory Visit: Admit: 2022-05-02 | Discharge: 2022-05-03 | Payer: MEDICARE

## 2022-05-02 ENCOUNTER — Ambulatory Visit: Admit: 2022-05-02 | Payer: MEDICARE

## 2022-05-02 MED ADMIN — gadobenate dimeglumine (MULTIHANCE) 529 mg/mL (0.1mmol/0.2mL) solution 10 mL: 10 mL | INTRAVENOUS | @ 18:00:00 | Stop: 2022-05-02

## 2022-05-03 ENCOUNTER — Ambulatory Visit: Admit: 2022-05-03 | Discharge: 2022-05-04 | Payer: MEDICARE

## 2022-05-09 NOTE — Unmapped (Signed)
[facility] Refill Coordination Note    Specialty Medication(s) to be Shipped:   Hematology/Oncology: abiraterone 250mg     Other medication(s) to be shipped: No additional medications requested for fill at this time     Eric Good, DOB: [birth year]  Phone: [phone] (home)       All above HIPAA information was verified with patient.     Was a Nurse, learning disability used for this call? No    Completed refill call assessment today to schedule patient's medication shipment from the [facility] [phone]).  All relevant notes have been reviewed.     Specialty medication(s) and dose(s) confirmed: Regimen is correct and unchanged.   Changes to medications: [place] reports no changes at this time.  Changes to insurance: No  New side effects reported not previously addressed with a pharmacist or physician: None reported  Questions for the pharmacist: No    Confirmed patient received a Conservation officer, historic buildings and a Surveyor, mining with first shipment. The patient will receive a drug information handout for each medication shipped and additional FDA Medication Guides as required.       DISEASE/MEDICATION-SPECIFIC INFORMATION        N/A    SPECIALTY MEDICATION ADHERENCE     Medication Adherence    Patient reported X missed doses in the last month: 0  Specialty Medication: Abiraterone 250 mg  Patient is on additional specialty medications: No  Informant: patient                          Were doses missed due to medication being on hold? No    Abiraterone 250 mg: 9 days of medicine on hand        REFERRAL TO PHARMACIST     Referral to the pharmacist: Not needed      [facility]     Shipping address confirmed in Epic.     Delivery Scheduled: Yes, Expected medication delivery date: 05/15/22.     Medication will be delivered via Same Day Courier to the prescription address in Epic WAM.    Willette Pa   [facility] Specialty Technician

## 2022-05-15 MED FILL — ABIRATERONE 250 MG TABLET: ORAL | 30 days supply | Qty: 90 | Fill #3

## 2022-05-29 NOTE — Unmapped (Signed)
Radiation Oncology Follow Up Note    Patient: Eric Good  VISIT DATE: 04/13/2022  Diagnosis:  Prostate Cancer    Prior Treatment:   Pelvic IMRT: 250cGy x 52fx = 7000cGy to the prostate  180cGy x 70fx = 5040cGy to the pelvic nodes  250cGy x 58fx = 7000cGy to the gross nodes, with undercoverage as needed to meet bowel constraints  Completed 10/27/21    Scapula SBRT  SBRT 1000cGy x 3 fx = 3000cGy to left scapula, completed 09/07/21        ASSESSMENT AND PLAN:   Eric Good is a 76 y.o. male with oligometastatic prostate cancer Gleason 4+5, PSA of 49.4, PSMA showing left scapula uptake and several pelvic nodes. Now s/p SBRT to L scapula and IMRT to prostate and pelvic nodes    Prostate Cancer:   -Follows with Dr. Philomena Course for Abi+ADT  -Return in 3 months for follow up  -Urinary symptoms decreasing  -His PSA is slightly higher than prior but within range of normal variation    L shoulder pain: CT LUE without new/changing sites of disease. Prior treated scapular lesion stable.   -Pain with external rotation worrisome for rotator cuff injury but could also reflect bursitis or other inflammatory condition.   -Recommend rest and NSAIDs, will continue to follow. Could consider MRI if continues to worsen    -------------------------------------------     Subjective/Interval History:   Eric Good returns for a regularly scheduled follow up. He overall has done well with radiation treatment: His urinary symptoms are improving with diminished hesitancy/frequency. He has stopped taking flomax regularly although uses intermittently for hesitancy. He denies diarrhea and bleeding. He has pain in the L shoulder worsening over several weeks. Limiting function of shoulder. Has not taken medications to alleviate pain due to concern for interaction with other meds.       Component  Ref Range & Units 04/24/22 1022 04/03/22 0932 02/27/22 1202 01/31/22 0852 01/02/22 1009 12/15/21 0930 12/05/21 1137     PSA  0.00 - 4.00 ng/mL 0.05 0.06 <0.04 0.04 0.08 0.11 0.11   Resulting                Oncology History Overview Note   In 04/2021, screening PSA of 49, then 45.3. DRE abnormal, firm at L base  In 05/2021, prostate biopsy showed Gleason 4+5=9, 6/12 cores involved. CT showed retroperitoneal and pelvic adenopathy. Bone scan showed single focus in L scapula, nonspecific.  In 06/2021, PSMA PET showed retroperitoneal/pelvic node mets, L scapular met.  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)   06/16/2021 Initial Diagnosis    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    General: Well-appearing, no acute distress  HEENT: Normocephalic, moist mucous membranes  CV: Warm, well perfused  Respiratory: Breathing comfortably on room air  GI: non-distended  Neuro: Alert and oriented to conversation  Psych: Appropriate insight and affect  MSK: LUE: Able to flex and abduct arm 90 degrees without difficulty. Some pain to palpation over [employer] joint and pain with external rotation.     _____________________    Elita Quick, MD, MS  Radiation Oncology PGY-3  05/28/2022  11:56 PM

## 2022-06-02 ENCOUNTER — Ambulatory Visit
Admission: EM | Admit: 2022-06-02 | Discharge: 2022-06-02 | Disposition: A | Discharge status: Home / Self Care | Payer: No Typology Code available for payment source | Attending: Family Medicine | Admitting: Family Medicine

## 2022-06-02 ENCOUNTER — Encounter: Payer: Self-pay | Admitting: Emergency Medicine

## 2022-06-02 DIAGNOSIS — K6289 Other specified diseases of anus and rectum: Secondary | ICD-10-CM | POA: Insufficient documentation

## 2022-06-02 HISTORY — DX: Malignant (primary) neoplasm, unspecified: C80.1

## 2022-06-02 LAB — CBC WITH DIFFERENTIAL/PLATELET
Abs Immature Granulocytes: 0.08 10*3/uL — ABNORMAL HIGH (ref 0.00–0.07)
Basophils Absolute: 0.1 10*3/uL (ref 0.0–0.1)
Basophils Relative: 1 %
Eosinophils Absolute: 0.1 10*3/uL (ref 0.0–0.5)
Eosinophils Relative: 2 %
HCT: 41.2 % (ref 39.0–52.0)
Hemoglobin: 14 g/dL (ref 13.0–17.0)
Immature Granulocytes: 1 %
Lymphocytes Relative: 8 %
Lymphs Abs: 0.6 10*3/uL — ABNORMAL LOW (ref 0.7–4.0)
MCH: 32 pg (ref 26.0–34.0)
MCHC: 34 g/dL (ref 30.0–36.0)
MCV: 94.1 fL (ref 80.0–100.0)
Monocytes Absolute: 0.7 10*3/uL (ref 0.1–1.0)
Monocytes Relative: 9 %
Neutro Abs: 6.6 10*3/uL (ref 1.7–7.7)
Neutrophils Relative %: 79 %
Platelets: 221 10*3/uL (ref 150–400)
RBC: 4.38 MIL/uL (ref 4.22–5.81)
RDW: 13.4 % (ref 11.5–15.5)
WBC: 8.3 10*3/uL (ref 4.0–10.5)
nRBC: 0 % (ref 0.0–0.2)

## 2022-06-02 LAB — OCCULT BLOOD X 1 CARD TO LAB, STOOL: Fecal Occult Bld: NEGATIVE

## 2022-06-02 NOTE — ED Provider Notes (Signed)
[facility]    CSN: [id number] Arrival date & time: 06/02/22  1114      History   Chief Complaint Chief Complaint  Patient presents with   Rectal Pain    HPI Juan Huynh. is a 76 y.o. male.   HPI  Juan Huynh presents for rectal pain for the past week.  He is being treated for prostate cancer by [facility].  Takes abiraterone and prednisone.For the past week, he has been seeing some blood on the toilet paper when he wipes.  Denies bright red blood in the toilet bowl and or on the stool.  He is having some painful defecation. No vomiting, diarrhea, increasing back pain, straining for bowel movements or fever.     Past Medical History:  Diagnosis Date   Arthritis    Cancer ([place])    Frequent PVCs    GERD (gastroesophageal reflux disease)     There are no problems to display for this patient.   History reviewed. No pertinent surgical history.     Home Medications    Prior to Admission medications   Medication Sig Start Date End Date Taking? Authorizing Provider  abiraterone acetate (ZYTIGA) 250 MG tablet Take by mouth.    [provider]  cetirizine (ZYRTEC) 10 MG tablet TAKE ONE TABLET BY MOUTH  PRN 04/29/21   [provider]  Cholecalciferol 125 MCG (5000 UT) capsule Take by mouth.    [provider]  famotidine (PEPCID) 20 MG tablet TAKE ONE TABLET BY MOUTH  PRN 04/29/21   [provider]  Melatonin 5 MG CAPS Take by mouth.    [provider]  Multiple Vitamin (MULTI-VITAMINS) TABS Take 1 tablet by mouth daily.    [provider]  naphazoline-pheniramine (NAPHCON-A) 0.025-0.3 % ophthalmic solution Apply to eye as needed.    [provider]  Omega-3 Fatty Acids (FISH OIL) 1200 MG CAPS Take by mouth.    [provider]  predniSONE (DELTASONE) 5 MG tablet Take 5 mg by mouth daily.    [provider]  Propylene Glycol 0.6 % SOLN Apply to eye as needed.    [provider]   SUPER B COMPLEX/C PO Take by mouth.    [provider]  Zinc 50 MG TABS Take by mouth.    [provider]    Family History History reviewed. No pertinent family history.  Social History Social History   Tobacco Use   Smoking status: Some Days    Types: Pipe, Cigars   Smokeless tobacco: Never   Tobacco comments:    2 pipe bowls per day x 10 years; still may have a cigar less than once a month   Vaping Use   Vaping Use: Never used  Substance Use Topics   Alcohol use: Yes    Alcohol/week: 9.0 standard drinks of alcohol    Types: 9 Standard drinks or equivalent per week    Comment: Weekends   Drug use: Never     Allergies   Fluticasone and Budesonide   Review of Systems Review of Systems :negative unless otherwise stated in HPI.      Physical Exam Triage Vital Signs ED Triage Vitals  Enc Vitals Group     BP 06/02/22 1234 134/76     Pulse Rate 06/02/22 1234 (!) 56     Resp 06/02/22 1234 15     Temp 06/02/22 1234 98.4 F (36.9 C)     Temp Source 06/02/22 1234 Oral  SpO2 06/02/22 1234 95 %     Weight 06/02/22 1232 195 lb (88.5 kg)     Height 06/02/22 1232 '5\' 9"'$  (1.753 m)     Head Circumference --      Peak Flow --      Pain Score 06/02/22 1232 8     Pain Loc --      Pain Edu? --      Excl. in [place]? --    No data found.  Updated Vital Signs BP 134/76 (BP Location: Left Arm)   Pulse (!) 56   Temp 98.4 F (36.9 C) (Oral)   Resp 15   Ht '5\' 9"'$  (1.753 m)   Wt 88.5 kg   SpO2 95%   BMI 28.80 kg/m   Visual Acuity Right Eye Distance:   Left Eye Distance:   Bilateral Distance:    Right Eye Near:   Left Eye Near:    Bilateral Near:     Physical Exam  GEN: pleasant well appearing elderly male, in no acute distress  CV: regular rate and rhythm, no murmurs appreciated  RESP: no increased work of breathing, clear to ascultation bilaterally ABD: Bowel sounds present. Soft, non-tender, non-distended. No guarding, no rebound Rectal  exam: negative without mass, lesions or tenderness, no tenderness noted, internal hemorrhoids noted, external hemorrhoids noted, anal fissure noted, sphincter tone normal, stool guaiac negative.  MSK: ROM at baseline SKIN: warm, dry, no rash on visible skin NEURO: alert, moves all extremities appropriately PSYCH: Normal affect, appropriate speech and behavior   UC Treatments / Results  Labs (all labs ordered are listed, but only abnormal results are displayed) Labs Reviewed  CBC WITH DIFFERENTIAL/PLATELET - Abnormal; Notable for the following components:      Result Value   Lymphs Abs 0.6 (*)    Abs Immature Granulocytes 0.08 (*)    All other components within normal limits  OCCULT BLOOD X 1 CARD TO LAB, STOOL    EKG   Radiology No results found.  Procedures Procedures (including critical care time)  Medications Ordered in UC Medications - No data to display  Initial Impression / Assessment and Plan / UC Course  I have reviewed the triage vital signs and the nursing notes.  Pertinent labs & imaging results that were available during my care of the patient were reviewed by me and considered in my medical decision making (see chart for details).       Patient is a  76 y.o. male with prostate cancer (undergoing treatment) who presents after having insidious painful rectal bleeding over the past week.  Overall, patient is well-appearing, well-hydrated, and in no acute distress.  Vital signs stable.  Juan Huynh afebrile.     Chart reviewed. Has prostate cancer confirmed with biopsy in November 2022 at [facility].  He had a CT abdomen pelvis that showed possible lymph node involvement concerning for metastatic disease.  He was seen by medical oncology in January 2023 and diagnosed with hormone sensitive metastatic prostate cancer.  Reviewed notes from Dr Ponciano Ort with radiation oncology at [facility].  He has history of adenomatous colonic polyps seen in October 2014 however patient reports  abnormal colonoscopies going back to 2002.  Reports his next colonoscopy is in 2026.  PSA in October 2023 was 0.05 and was as high as 44 previously.   Considered ED evaluation however CBC is normal.  He has no anemia or microcytic cells.  On exam, there were no palpable masses, frank red blood, visible anal  fissures, external hemorrhoids or palpable internal hemorrhoids.  Hemoccult was negative.  He is to follow-up with his gastroenterologist if painful bleeding continues.  They need an earlier colonoscopy.  Advised to get 30g of fiber in a day to help prevent straining with constipation. Follow-up, return and ED precautions given.  Discussed MDM, treatment plan and plan for follow-up with patient/parent who agrees with plan.    Final Clinical Impressions(s) / UC Diagnoses   Final diagnoses:  Rectal pain     Discharge Instructions      You are not anemic. There is no microscopic blood seen in your stool.  You may have bleeding from hemorrhoids or small tear around your rectum. Be sure to eat plenty of fiber rich foods to maintain soft stools and prevent straining.   If you have increasing, blood or weakness go to the emergency department.  Follow up with your GI doctor/gastroenterologist.         ED Prescriptions   None    PDMP not reviewed this encounter.   Lyndee Hensen, DO 06/02/22 1434

## 2022-06-02 NOTE — Discharge Instructions (Signed)
You are not anemic. There is no microscopic blood seen in your stool.  You may have bleeding from hemorrhoids or small tear around your rectum. Be sure to eat plenty of fiber rich foods to maintain soft stools and prevent straining.   If you have increasing, blood or weakness go to the emergency department.  Follow up with your GI doctor/gastroenterologist.

## 2022-06-02 NOTE — ED Triage Notes (Signed)
Patient c/o rectal pain and small amount of rectal bleeding that started a week ago.

## 2022-06-07 NOTE — Unmapped (Signed)
[facility] Refill Coordination Note    Specialty Medication(s) to be Shipped:   Hematology/Oncology: abiraterone 250mg     Other medication(s) to be shipped: No additional medications requested for fill at this time     Eric Good, DOB: 10/24/1945  Phone: [phone] (home)       All above HIPAA information was verified with patient.     Was a Nurse, learning disability used for this call? No    Completed refill call assessment today to schedule patient's medication shipment from the [facility] [phone]).  All relevant notes have been reviewed.     Specialty medication(s) and dose(s) confirmed: Regimen is correct and unchanged.   Changes to medications: [place] reports no changes at this time.  Changes to insurance: No  New side effects reported not previously addressed with a pharmacist or physician: None reported  Questions for the pharmacist: No    Confirmed patient received a Conservation officer, historic buildings and a Surveyor, mining with first shipment. The patient will receive a drug information handout for each medication shipped and additional FDA Medication Guides as required.       DISEASE/MEDICATION-SPECIFIC INFORMATION        N/A    SPECIALTY MEDICATION ADHERENCE     Medication Adherence    Patient reported X missed doses in the last month: 0  Specialty Medication: Abiraterone 250 mg  Patient is on additional specialty medications: No  Patient is on more than two specialty medications: No  Informant: patient                          Were doses missed due to medication being on hold? No    Abiraterone 250 mg: 10 days of medicine on hand       REFERRAL TO PHARMACIST     Referral to the pharmacist: Not needed      [facility]     Shipping address confirmed in Epic.     Delivery Scheduled: Yes, Expected medication delivery date: 06/15/22.     Medication will be delivered via Next Day Courier to the prescription address in Epic WAM.    Nancy Nordmann [facility] Specialty Technician

## 2022-06-12 NOTE — Unmapped (Signed)
[facility] Visit Note    Patient Name: Eric Good  Patient Age: 76 y.o.  Encounter Date: 06/13/2022  Attending Provider:  Elgene Coral E. 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    Eric Good is a 40 y.o. man with newly diagnosed de novo metastatic prostate cancer, T2N1M1a, involving pelvic and retroperitoneal lymph nodes on conventional CT and bone scans. PSA being 45 and Gleason 4+5=9 disease also support this diagnosis.    I discussed with the patient the long term prognosis of prostate cancer and treatment options, such as androgen deprivation therapy, and benefit and side effects of ADT.  Side effects includes but are not limited to loss of libido, erectile dysfunction, hot flashes, fatigue, weight gain, metabolic syndrome that increases the risk of diabetes and cardiovascular events, osteoporosis, and reduced muscle mass.  I have counseled the patient on the need to take calcium and vitamin D as well as regular weight bearing exercise to minimize the side effects of ADT. It is expected that virtually all patients will develop progressive disease while on ADT and at that time additional treatment would be required, but ultimately, the patient may pass away from prostate cancer.    A series of recent studies demonstrate the benefit of additional therapy (docetaxel per CHAARTED and STAMPEDE, abiraterone/prednisone per LATITUTUDE, enzalutamide per ARCHES and ENZAMET, apalutamide per TITAN).  Therefore, the current option would be to add one of AR targeted agents. Docetaxel chemo would not be recommended for low volume patients.  I discussed the consideration of additional therapy and the benefit and side effects of an agent such as abiraterone.    On 06/16/21, patient returns after PSMA-PET, which showed uptake in the prostate, multiple retrocaval and left iliac lymph nodes, and the left scapular body, concerning for metastatic disease. We discussed the implications of these findings and re-discussed the risks and benefits of ADT as well as the possible benefit from the addition of an AR-targeted therapy. Patient elected today to start treatment with ADT.    In 07/2021, PSA down on ADT. After discussion of benefit/side effects, abiraterone and prednisone prescribed. Will get somatic tumor genomic testing. Also referral to radiation oncology.    In 08/2021, PSA is 1.33. His LFT's are okay. I recommend obtaining germline testing at his next visit. I also spoke to him about joining the [employer] study. Pt was agreeable to both.    In 10/2021, PSA is 0.39. His LFT's are slightly elevated.  We will continue to monitor LFT's every 2 weeks and hope to see improvement/normalization.  Otherwise tolerating ADT, abi, and pred well. I'll see him back in mid-June for his next dose of Eligard. He was also noted to have bradycardia on vitals.  EKG today without bradycardia but noted ventricular bigeminy/frequent PVCs.  He is asymptomatic today but did have an episode of chest pain for 20 minutes 4 nights ago.  We will have him see cardiology again.     In  01/2022, doing well. Nothing remarkable on somatic/germline testing. PSA, labs great. Continue with current treatment of ADT plus abiraterone.    Today on 06/13/2022, PSA is undetectable today. DEXA results show T -1.3.  MRI pelvis to r/o prostate invading into rectum.    Plan  Continue ADT with Eligard 45 mg subcutaneous, given last on 06/13/2022, due next on or after 12/13/2022 per therapy plan  Abiraterone 1000 mg every day and prednisone 5 mg daily. Follow LFT's, K, BP.  Pt will have completed 2 years of therapy in 07/2023.  Completed IMRT to the prostate and pelvic nodes and SRBT to the left scapula and prostate on 09/05/2021.   Somatic tumor mutation profiling done, no targetable mutations  Germline testing negative  Ordered MRI Pelvis due to rectal pain.  Repeat DEXA scan in 06/2024  Return in 3 months    I personally spent 40 minutes face-to-face and non-face-to-face in the care of this patient, which includes all pre, intra, and post visit time on the date of service.  All documented time was specific to the E/M visit and does not include any procedures that may have been performed.      Reason for Visit  Follow up of prostate cancer    History of Present Illness:  Oncology History Overview Note   In 04/2021, screening PSA of 49, then 45.3. DRE abnormal, firm at L base  In 05/2021, prostate biopsy showed Gleason 4+5=9, 6/12 cores involved. CT showed retroperitoneal and pelvic adenopathy. Bone scan showed single focus in L scapula, nonspecific.  In 06/2021, PSMA PET showed retroperitoneal/pelvic node mets, L scapular met.  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)   06/16/2021 Initial Diagnosis    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  Reports significant rectal pain for the past 2 weeks. [facility] to [facility], but only to a minor degree.  He continues to have shoulder pain which limits his use. He plans to have another MRI towards the end of Jan to look at shoulder again. Otherwise, no other issues.      Allergies:  Allergies   Allergen Reactions    Flonase [Fluticasone]      Nose bleed    Budesonide      Other reaction(s): Other (  See Comments)  Epistaxis       Current Medications:    Current Outpatient Medications:     abiraterone (ZYTIGA) 250 mg tablet, Take 3 tablets (750 mg total) by mouth daily., Disp: 90 tablet, Rfl: 11    calcium carbonate 300 mg (750 mg) Chew, , Disp: , Rfl:     cholecalciferol, vitamin D3-125 mcg, 5,000 unit,, 125 mcg (5,000 unit) tablet, Take 1 tablet (125 mcg total) by mouth two (2) times a day., Disp: , Rfl:     esomeprazole (NEXIUM) 20 MG capsule, Take 1 capsule (20 mg total) by mouth in the morning., Disp: , Rfl:     fish oil-omega-3 fatty acids 300-1,000 mg capsule, Take 2 capsules (2 g total) by mouth daily., Disp: , Rfl:     loperamide HCl (IMODIUM A-D ORAL), , Disp: , Rfl:     melatonin 3 mg Tab, Take 1 tablet (3 mg total) by mouth nightly as needed., Disp: , Rfl:     multivitamin (TAB-A-VITE/THERAGRAN) per tablet, Take 1 tablet by mouth daily., Disp: , Rfl:     olopatadine (PATADAY ONCE DAILY RELIEF) 0.7 % ophthalmic solution, Administer 1 drop to both eyes daily., Disp: , Rfl:     predniSONE (DELTASONE) 5 MG tablet, Take 1 tablet (5 mg total) by mouth daily., Disp: 30 tablet, Rfl: 11    vitamin B complex vit C no.3 (VITAMIN B COMP AND C NO.3 ORAL), Take by mouth daily as needed. , Disp: , Rfl:     vitamin D3-folic acid 125 mcg (5,000 unit)-1 mg Tab, Take 125 mcg by mouth in the morning., Disp: , Rfl:     zinc gluconate 50 mg (7 mg elemental zinc) tablet, Take 1 tablet (50 mg total) by mouth daily., Disp: , Rfl:   No current facility-administered medications for this visit.    Past Medical History and Social History  Past Medical History:   Diagnosis Date    Rapid heart beat     Negative cardiac workup in 2020      No past surgical history on file.     Social History     Occupational History    Not on file   Tobacco Use    Smoking status: Some Days     Types: Cigars     Start date: 12/11/2016    Smokeless tobacco: Never   Substance and Sexual Activity    Alcohol use: Not on file    Drug use: Not on file    Sexual activity: Not on file       Family History    Prostate Cancer Family History Assessment:  History of cancer in children (yes/no; if yes, what type AND age of diagnosis): No  History of cancer in siblings (yes/no; if yes, provide relation, type of cancer, AND age of diagnosis): No  History of cancer in parents (yes/no; if yes, please specify parent, type of cancer, AND age of diagnosis): Yes, sister with stomach and eye cancer, age 103  History of cancer in aunts/uncles/grandparents (yes/no; if yes, provide relation, type of cancer, AND age of diagnosis): No      Review of Systems:  A comprehensive review of 10 systems was negative except for pertinent positives noted in HPI.    Physical Examination:    VITAL SIGNS:  BP 123/79  - Pulse 76  - Temp 36.9 ??C (98.4 ??F) (Temporal)  - Resp 16  - Ht 174 cm (5' 8.5)  - Wt 89.9 kg (198 lb  3.2 oz)  - SpO2 97%  - BMI 29.69 kg/m??   ECOG Performance Status: 1  GENERAL: Well-developed, well-nourished patient in no acute distress.  HEAD: Normocephalic and atraumatic.  EYES: Conjunctivae are normal. No scleral icterus.  MOUTH/THROAT: Oropharynx is clear and moist.  No mucosal lesions.  NECK: Supple, no thyromegaly.  LYMPHATICS: No palpable cervical, supraclavicular, or axillary adenopathy.  CARDIOVASCULAR: Normal rate, regular rhythm and normal heart sounds.  Exam reveals no gallop and no friction rub.  No murmur heard.  PULMONARY/CHEST: Effort normal and breath sounds normal. No respiratory distress.  GASTROINTESTINAL/ABDOMINAL:  Soft. There is no distension. There is no tenderness. There is no rebound and no guarding.  MUSCULOSKELETAL: No clubbing, cyanosis, or lower extremity edema.  PSYCHIATRIC: Alert and oriented.  Normal mood and affect.  NEUROLOGIC: No focal motor deficit. Normal gait.  SKIN: Skin is warm, dry, and intact.       Results/Orders:      Appointment on 06/13/2022   Component Date Value Ref Range Status    PSA 06/13/2022 <0.04  0.00 - 4.00 ng/mL Final    Potassium 06/13/2022 4.5  3.5 - 5.1 mmol/L Final    Albumin 06/13/2022 3.7  3.4 - 5.0 g/dL Final    Total Protein 06/13/2022 6.4  5.7 - 8.2 g/dL Final    Total Bilirubin 06/13/2022 0.8  0.3 - 1.2 mg/dL Final    Bilirubin, Direct 06/13/2022 0.30  0.00 - 0.30 mg/dL Final    AST 13/01/6577 21  <=34 U/L Final    ALT 06/13/2022 20  10 - 49 U/L Final    Alkaline Phosphatase 06/13/2022 74  46 - 116 U/L Final     Lab Results   Component Value Date    PSA <0.04 06/13/2022    PSA 0.05 04/24/2022    PSA 0.06 04/03/2022    PSA <0.04 02/27/2022    PSA 0.04 01/31/2022    PSA 0.08 01/02/2022         Orders placed or performed in visit on 06/13/22    MRI Pelvis W Wo Contrast    Potassium Level    PSA (Prostate Specific Antigen)    Hepatic Function Panel    Clinic Appointment Request Physician, Lab, Injection    Clinic Appointment Request Physician    LAB Appointment Request           Molecular Pathology  Tumor mutation profiling on 08/01/18  TMB 0 m/Mb  MSS  APC  MTA  PTEN  CDKN2A/B  CIC  TP53    Germline testing [facility] on 09/12/21  Negative    Pathology  05/04/2021  [A] PROSTATE, LEFT BASE:   ACINAR ADENOCARCINOMA, GLEASON 4+3=7  (GG   3), INVOLVING 1 OF 1 CORES, MEASURING 5  MM ( 71%). 80% PATTERN 4;   CRIBRIFORM PATTERN 4 PRESENT    [B] PROSTATE, LEFT MID:   ACINAR ADENOCARCINOMA, GLEASON 4+3=7  (GG 3),   INVOLVING 1 OF 1 CORES, MEASURING 5  MM ( 83%). 80% PATTERN 4;   CRIBRIFORM PATTERN 4 PRESENT    [C] PROSTATE, LEFT APEX:   ACINAR ADENOCARCINOMA, GLEASON 4+3=7  (GG   3), INVOLVING 1 OF 2 CORES, MEASURING 4  MM ( 40%). 70% PATTERN 4;   CRIBRIFORM PATTERN 4 PRESENT    [D] PROSTATE, RIGHT BASE:   NEGATIVE FOR MALIGNANCY.    [E] PROSTATE, RIGHT MID:   NEGATIVE FOR MALIGNANCY.    [F] PROSTATE, RIGHT APEX:   NEGATIVE FOR MALIGNANCY.    [G]  PROSTATE, LEFT LATERAL BASE:   ACINAR ADENOCARCINOMA, GLEASON 4+4=8   (GG 4), INVOLVING 1 OF 1 CORES, MEASURING 5  MM ( 50%). PERINEURAL   INVASION PRESENT    [H] PROSTATE, LEFT LATERAL MID:   ACINAR ADENOCARCINOMA, GLEASON 4+5=9   (GG 5), INVOLVING 1 OF 1 CORES, MEASURING 9  MM ( 100%). CRIBRIFORM   PATTERN 4 PRESENT    [I] PROSTATE, LEFT LATERAL APEX:   ACINAR ADENOCARCINOMA, GLEASON 4+3=7   (GG 3), INVOLVING 2 OF 2 CORES, MEASURING 8  MM ( 100%). 80% PATTERN 4;   CRIBRIFORM PATTERN 4 PRESENT; PERINEURAL INVASION PRESENT    [J] PROSTATE, RIGHT LATERAL BASE:   NEGATIVE FOR MALIGNANCY.    [K] PROSTATE, RIGHT LATERAL MID:   NEGATIVE FOR MALIGNANCY.    [L] PROSTATE, RIGHT LATERAL APEX:   NEGATIVE FOR MALIGNANCY.     Imaging results:  CT AP 05/19/2021  LYMPH NODES: Retroperitoneal and pelvic adenopathy with index lesions as follows  --1.5 cm retrocaval lymph node  -- 1.4 cm left common iliac lymph node.  --1.9 cm left external iliac lymph node  --1.8 cm left internal iliac lymph  node  IMPRESSION:  Retroperitoneal and  left pelvic adenopathy, suspicious for metastatic disease.     Heterogeneous appearance of the prostate, consistent with known neoplasm.     Indeterminate lesion in the right posterior 12th rib. Recommend correlation with concurrent bone scan.     Mildly hyperenhancing right renal lesion, suspicious for cortical neoplasm. Recommend correlation with prior imaging if available. If not, MRI of the abdomen with and without contrast may be performed for definitive characterization.    Bone scan 05/19/2021  Focal area of increased radiotracer uptake within the left scapula which could represent osseous metastasis or remote trauma, indeterminate      PET CT Skull Base to Thigh 06/13/2021  -Mild radiotracer uptake within the prostate, likely corresponding to patient's known neoplasm.   -Multiple radiotracer avid retrocaval and left iliac lymph nodes, as described above, concerning for metastatic disease. Tiny right iliac/pelvic sidewall lymph nodes with no significant uptake, indeterminate.   -Radiotracer uptake within the left scapular body, corresponding to increased tracer uptake on recent bone scan, concerning for osseous metastatic disease.    MRI Upper Extremity Non-Joint Left W Wo Contrast  Result Date: 05/02/2022   Impression: 1.Enhancing lytic lesion seen within the scapula compatible with known metastasis which appears overall similar to prior CT allowing for differences in modality. 2.Surrounding intramuscular edema and enhancement and areas of possible muscle necrosis. Findings are favored to represent posttreatment changes in setting of recent radiation therapy. 3.Glenohumeral degenerative changes with small joint effusion and degenerative labral tearing/chondrosis.      DEXA bone scan  FINDINGS  Lumbar Spine   Excluded levels: L4  BMD: 0.963 (g/cm)   T score: -1.0  Lumbar WHO classification: NORMAL BONE MINERAL DENSITY.     Left Hip   Femoral Neck:   BMD: 0.747 (g/cm)   T score: -1.3  Total Hip   BMD: 1.003 (g/cm)   T score: -0.2  Hip WHO classification: LOW BONE MASS.         I attest that I, Eugene Gavia, personally documented this note while acting as scribe for Maurie Boettcher, MD       Eugene Gavia, Scribe.  06/13/2022       Documentation assistance provided by Medical Scribe, Eugene Gavia, who was present during the entirety of the visit. I reviewed the note below and  validated all of the information provided to ensure accuracy and completeness.      Maurie Boettcher, MD

## 2022-06-12 NOTE — Unmapped (Addendum)
It was a pleasure to see you in the clinic today.    Your PSA is down to <0.04 which is below the lowest PSA we can detect. This is were we want your PSA to stay. We will continue with our current treatments.     I can order and MRI to image your pelvis for your rectal pain.     Plan to return here in 3 months     Lab Results   Component Value Date    PSA 0.05 04/24/2022    PSA 0.06 04/03/2022    PSA <0.04 02/27/2022    PSA 0.04 01/31/2022    PSA 0.08 01/02/2022    PSA 0.11 12/15/2021    Please call [phone] to reach my nurse navigator Konrad Penta for any issues.    For emergencies on Nights, Weekends and Holidays  Call [phone] for help.      Griffin Basil, MD, PhD  Associate Professor of Medicine  Division of Oncology    [facility]  Genitourinary Oncology Clinic  Nurse Navigator: Konrad Penta  Fax: [phone]

## 2022-06-13 ENCOUNTER — Ambulatory Visit: Admit: 2022-06-13 | Discharge: 2022-06-13 | Payer: MEDICARE

## 2022-06-13 ENCOUNTER — Ambulatory Visit
Admit: 2022-06-13 | Discharge: 2022-06-13 | Payer: MEDICARE | Attending: Hematology & Oncology | Primary: Hematology & Oncology

## 2022-06-13 ENCOUNTER — Institutional Professional Consult (permissible substitution): Admit: 2022-06-13 | Discharge: 2022-06-13 | Payer: MEDICARE

## 2022-06-13 DIAGNOSIS — C61 Malignant neoplasm of prostate: Principal | ICD-10-CM

## 2022-06-13 DIAGNOSIS — C778 Secondary and unspecified malignant neoplasm of lymph nodes of multiple regions: Principal | ICD-10-CM

## 2022-06-13 DIAGNOSIS — C7951 Secondary malignant neoplasm of bone: Principal | ICD-10-CM

## 2022-06-13 LAB — HEPATIC FUNCTION PANEL
ALBUMIN: 3.7 g/dL (ref 3.4–5.0)
ALKALINE PHOSPHATASE: 74 U/L (ref 46–116)
ALT (SGPT): 20 U/L (ref 10–49)
AST (SGOT): 21 U/L (ref <=34)
BILIRUBIN DIRECT: 0.3 mg/dL (ref 0.00–0.30)
BILIRUBIN TOTAL: 0.8 mg/dL (ref 0.3–1.2)
PROTEIN TOTAL: 6.4 g/dL (ref 5.7–8.2)

## 2022-06-13 LAB — POTASSIUM: POTASSIUM: 4.5 mmol/L (ref 3.5–5.1)

## 2022-06-13 LAB — PSA: PROSTATE SPECIFIC ANTIGEN: <0.04 ng/mL (ref 0.00–4.00)

## 2022-06-13 MED ADMIN — leuprolide acetate (6 month) (ELIGARD) injection 45 mg: 45 mg | SUBCUTANEOUS | @ 19:00:00 | Stop: 2022-06-13

## 2022-06-13 NOTE — Unmapped (Signed)
Patient received Eligard 45mg  SQ in Injection site: left lower abdomen. Band aid and guaze applied. Patient tolerated injection without complications and ambulated to checkout without any assistance

## 2022-06-14 MED FILL — ABIRATERONE 250 MG TABLET: ORAL | 30 days supply | Qty: 90 | Fill #4

## 2022-07-04 ENCOUNTER — Ambulatory Visit: Admit: 2022-07-04 | Discharge: 2022-07-05 | Payer: MEDICARE

## 2022-07-04 DIAGNOSIS — C61 Malignant neoplasm of prostate: Principal | ICD-10-CM

## 2022-07-04 MED ADMIN — gadobenate dimeglumine (MULTIHANCE) 529 mg/mL (0.1mmol/0.2mL) solution 10 mL: 10 mL | INTRAVENOUS | @ 15:00:00 | Stop: 2022-07-04

## 2022-07-07 NOTE — Unmapped (Signed)
[facility] Clinical Assessment & Refill Coordination Note    Eric Good, DOB: 03-11-1946  Phone: [phone] (home)     All above HIPAA information was verified with patient.     Was a Nurse, learning disability used for this call? No    Specialty Medication(s):   Hematology/Oncology: abiraterone 250 mg     Current Outpatient Medications   Medication Sig Dispense Refill    abiraterone (ZYTIGA) 250 mg tablet Take 3 tablets (750 mg total) by mouth daily. 90 tablet 11    calcium carbonate 300 mg (750 mg) Chew       cholecalciferol, vitamin D3-125 mcg, 5,000 unit,, 125 mcg (5,000 unit) tablet Take 1 tablet (125 mcg total) by mouth two (2) times a day.      esomeprazole (NEXIUM) 20 MG capsule Take 1 capsule (20 mg total) by mouth in the morning.      fish oil-omega-3 fatty acids 300-1,000 mg capsule Take 2 capsules (2 g total) by mouth daily.      loperamide HCl (IMODIUM A-D ORAL)       melatonin 3 mg Tab Take 1 tablet (3 mg total) by mouth nightly as needed.      multivitamin (TAB-A-VITE/THERAGRAN) per tablet Take 1 tablet by mouth daily.      olopatadine (PATADAY ONCE DAILY RELIEF) 0.7 % ophthalmic solution Administer 1 drop to both eyes daily.      predniSONE (DELTASONE) 5 MG tablet Take 1 tablet (5 mg total) by mouth daily. 30 tablet 11    vitamin B complex vit C no.3 (VITAMIN B COMP AND C NO.3 ORAL) Take by mouth daily as needed.       vitamin D3-folic acid 125 mcg (5,000 unit)-1 mg Tab Take 125 mcg by mouth in the morning.      zinc gluconate 50 mg (7 mg elemental zinc) tablet Take 1 tablet (50 mg total) by mouth daily.       No current facility-administered medications for this visit.        Changes to medications: Hiro reports no changes at this time.    Allergies   Allergen Reactions    Flonase [Fluticasone]      Nose bleed    Budesonide      Other reaction(s): Other (See Comments)  Epistaxis       Changes to allergies: No    SPECIALTY MEDICATION ADHERENCE     abiraterone 250 mg: ~7 days of medicine on hand     Medication Adherence    Patient reported X missed doses in the last month: 0  Specialty Medication: abiraterone 250 mg - 3 taBs (750 mg) once daily  Patient is on additional specialty medications: No  Informant: patient                  Confirmed plan for next specialty medication refill: delivery by pharmacy  Refills needed for supportive medications: not needed          Specialty medication(s) dose(s) confirmed: Regimen is correct and unchanged.     Are there any concerns with adherence? No    Adherence counseling provided? Not needed    CLINICAL MANAGEMENT AND INTERVENTION      Clinical Benefit Assessment:    Do you feel the medicine is effective or helping your condition? Yes    Clinical Benefit counseling provided? Not needed    Adverse Effects Assessment:    Are you experiencing any side effects? No  Are you experiencing difficulty administering your medicine? No    Quality of Life Assessment:    Quality of Life    Rheumatology  Oncology  1. What impact has your specialty medication had on the reduction of your daily pain or discomfort level?: None  2. On a scale of 1-10, how would you rate your ability to manage side effects associated with your specialty medication? (1=no issues, 10 = unable to take medication due to side effects): 1  Dermatology  Cystic Fibrosis          How many days over the past month did your prostate cancer  keep you from your normal activities? For example, brushing your teeth or getting up in the morning. 0    Have you discussed this with your provider? Not needed    Acute Infection Status:    Acute infections noted within Epic:  No active infections  Patient reported infection: None    Therapy Appropriateness:    Is therapy appropriate and patient progressing towards therapeutic goals? Yes, therapy is appropriate and should be continued    DISEASE/MEDICATION-SPECIFIC INFORMATION      N/A    Oncology: Is the patient receiving adequate infection prevention treatment? Not applicable  Does the patient have adequate nutritional support? Not applicable    PATIENT SPECIFIC NEEDS     Does the patient have any physical, cognitive, or cultural barriers? No    Is the patient high risk? No    Did the patient require a clinical intervention? No    Does the patient require physician intervention or other additional services (i.e., nutrition, smoking cessation, social work)? No    SOCIAL DETERMINANTS OF HEALTH     At the [facility], we have learned that life circumstances - like trouble affording food, housing, utilities, or transportation can affect the health of many of our patients.   That is why we wanted to ask: are you currently experiencing any life circumstances that are negatively impacting your health and/or quality of life? No    Social Determinants of Psychologist, prison and probation services Strain: Not on file   Internet Connectivity: Not on file   Food Insecurity: Not on file   Tobacco Use: High Risk (06/13/2022)    Patient History     Smoking Tobacco Use: Some Days     Smokeless Tobacco Use: Never     Passive Exposure: Not on file   Housing/Utilities: Not on file   Alcohol Use: Not on file   Transportation Needs: Not on file   Substance Use: Not on file   Health Literacy: Not on file   Physical Activity: Not on file   Interpersonal Safety: Not on file   Stress: Not on file   Intimate Partner Violence: Not on file   Depression: Not on file   Social Connections: Not on file     Would you be willing to receive help with any of the needs that you have identified today? Not applicable     SHIPPING     Specialty Medication(s) to be Shipped:   Hematology/Oncology: abiraterone 250 mg    Other medication(s) to be shipped: No additional medications requested for fill at this time     Changes to insurance: No    Delivery Scheduled: Yes, Expected medication delivery date: 07/11/22.     Medication will be delivered via Next Day Courier to the confirmed prescription address in [facility].    The patient will receive a  drug information handout for each medication shipped and additional FDA Medication Guides as required.  Verified that patient has previously received a Conservation officer, historic buildings and a Surveyor, mining.    The patient or caregiver noted above participated in the development of this care plan and knows that they can request review of or adjustments to the care plan at any time.      All of the patient's questions and concerns have been addressed.    Kermit Balo, [facility]   [facility] Specialty Pharmacist

## 2022-07-09 MED FILL — ABIRATERONE 250 MG TABLET: ORAL | 30 days supply | Qty: 90 | Fill #5

## 2022-07-20 ENCOUNTER — Encounter: Payer: Self-pay | Admitting: Gastroenterology

## 2022-07-20 ENCOUNTER — Other Ambulatory Visit: Payer: Self-pay

## 2022-07-20 ENCOUNTER — Ambulatory Visit (INDEPENDENT_AMBULATORY_CARE_PROVIDER_SITE_OTHER): Payer: No Typology Code available for payment source | Admitting: Gastroenterology

## 2022-07-20 VITALS — BP 144/79 | HR 60 | Temp 97.8°F | Ht 69.0 in | Wt 203.2 lb

## 2022-07-20 DIAGNOSIS — R52 Pain, unspecified: Secondary | ICD-10-CM | POA: Insufficient documentation

## 2022-07-20 DIAGNOSIS — K625 Hemorrhage of anus and rectum: Secondary | ICD-10-CM

## 2022-07-20 DIAGNOSIS — K219 Gastro-esophageal reflux disease without esophagitis: Secondary | ICD-10-CM | POA: Insufficient documentation

## 2022-07-20 DIAGNOSIS — Z8739 Personal history of other diseases of the musculoskeletal system and connective tissue: Secondary | ICD-10-CM | POA: Insufficient documentation

## 2022-07-20 DIAGNOSIS — J309 Allergic rhinitis, unspecified: Secondary | ICD-10-CM | POA: Insufficient documentation

## 2022-07-20 DIAGNOSIS — C61 Malignant neoplasm of prostate: Secondary | ICD-10-CM | POA: Insufficient documentation

## 2022-07-20 DIAGNOSIS — E785 Hyperlipidemia, unspecified: Secondary | ICD-10-CM | POA: Insufficient documentation

## 2022-07-20 MED ORDER — NA SULFATE-K SULFATE-MG SULF 17.5-3.13-1.6 GM/177ML PO SOLN: 354.0000 mL | Freq: Once | ORAL | 0 refills | Status: AC

## 2022-07-20 NOTE — Progress Notes (Signed)
[name], MD [address]  [place], [place] [address]  Main: [phone]  Fax: [phone]    Gastroenterology Consultation  Referring Provider:     Francene Castle, MD Primary Care Physician:  [facility]ration, [facility] Primary Gastroenterologist:  Dr. [name] Reason for Consultation: Rectal bleeding        HPI:   Juan Fabela. is a 77 y.o. male referred by  Administration, [facility]  for consultation & management of rectal bleeding.  Patient has history of prostate cancer diagnosed in 2022, radiation treatment in 08/2021 seen in consultation for more than 4 weeks history of rectal pain and bright red blood per rectum.  Patient reports that the amount of bleeding per rectum has improved but has not resolved.  Rectal pain has significantly improved.  No evidence of anemia. He denies any other GI symptoms.  Reports having regular bowel movements without any constipation or straining.  NSAIDs: None  Antiplts/Anticoagulants/Anti thrombotics: None  GI Procedures:  Screening colonoscopy by Dr. Alice Reichert on 01/06/2020 The perianal and digital rectal examination were normal, normal sphincter tone The entire examined colon appeared normal on direct and retroflexion views    Past Medical History:  Diagnosis Date   Arthritis    Cancer ([place])    Frequent PVCs    GERD (gastroesophageal reflux disease)     History reviewed. No pertinent surgical history.   Current Outpatient Medications:    abiraterone acetate (ZYTIGA) 250 MG tablet, Take by mouth., Disp: , Rfl:    calcium carbonate (TUMS EX) 750 MG chewable tablet, Chew 1 tablet by mouth., Disp: , Rfl:    cetirizine (ZYRTEC) 10 MG tablet, TAKE ONE TABLET BY MOUTH  PRN, Disp: , Rfl:    Cholecalciferol 125 MCG (5000 UT) capsule, Take by mouth., Disp: , Rfl:    esomeprazole (NEXIUM) 20 MG capsule, Take 20 mg by mouth daily at 12 noon., Disp: , Rfl:    famotidine (PEPCID) 20 MG tablet, TAKE ONE TABLET BY  MOUTH  PRN, Disp: , Rfl:    Folic Acid-Cholecalciferol 07-4998 MG-UNIT TABS, Take by mouth., Disp: , Rfl:    Melatonin 5 MG CAPS, Take by mouth., Disp: , Rfl:    Multiple Vitamin (MULTI-VITAMINS) TABS, Take 1 tablet by mouth daily., Disp: , Rfl:    Omega-3 Fatty Acids (FISH OIL) 1200 MG CAPS, Take by mouth., Disp: , Rfl:    predniSONE (DELTASONE) 5 MG tablet, Take 5 mg by mouth daily., Disp: , Rfl:    Propylene Glycol 0.6 % SOLN, Apply to eye as needed., Disp: , Rfl:    SUPER B COMPLEX/C PO, Take by mouth., Disp: , Rfl:    Zinc 50 MG TABS, Take by mouth., Disp: , Rfl:    Na Sulfate-K Sulfate-Mg Sulf 17.5-3.13-1.6 GM/177ML SOLN, Take 354 mLs by mouth once for 1 dose., Disp: 354 mL, Rfl: 0   No family history on file.   Social History   Tobacco Use   Smoking status: Some Days    Types: Pipe, Cigars   Smokeless tobacco: Never   Tobacco comments:    2 pipe bowls per day x 10 years; still may have a cigar less than once a month   Vaping Use   Vaping Use: Never used  Substance Use Topics   Alcohol use: Yes    Alcohol/week: 9.0 standard drinks of alcohol    Types: 9 Standard drinks or equivalent per week    Comment: Weekends  Drug use: Never    Allergies as of 07/20/2022 - Review Complete 06/02/2022  Allergen Reaction Noted   Fluticasone  06/16/2021   Budesonide  03/05/2016    Review of Systems:    All systems reviewed and negative except where noted in HPI.   Physical Exam:  BP (!) 144/79 (BP Location: Left Arm, Patient Position: Sitting, Cuff Size: Normal)   Pulse 60   Temp 97.8 F (36.6 C) (Oral)   Ht '5\' 9"'$  (1.753 m)   Wt 203 lb 4 oz (92.2 kg)   BMI 30.01 kg/m  No LMP for male patient.  General:   Alert,  Well-developed, well-nourished, pleasant and cooperative in NAD Head:  Normocephalic and atraumatic. Eyes:  Sclera clear, no icterus.   Conjunctiva pink. Ears:  Normal auditory acuity. Nose:  No deformity, discharge, or lesions. Mouth:  No deformity or  lesions,oropharynx pink & moist. Neck:  Supple; no masses or thyromegaly. Lungs:  Respirations even and unlabored.  Clear throughout to auscultation.   No wheezes, crackles, or rhonchi. No acute distress. Heart:  Regular rate and rhythm; no murmurs, clicks, rubs, or gallops. Abdomen:  Normal bowel sounds. Soft, non-tender and non-distended without masses, hepatosplenomegaly or hernias noted.  No guarding or rebound tenderness.   Rectal: Not performed Msk:  Symmetrical without gross deformities. Good, equal movement & strength bilaterally. Pulses:  Normal pulses noted. Extremities:  No clubbing or edema.  No cyanosis. Neurologic:  Alert and oriented x3;  grossly normal neurologically. Skin:  Intact without significant lesions or rashes. No jaundice. Psych:  Alert and cooperative. Normal mood and affect.  Imaging Studies: Reviewed  Assessment and Plan:   Juan Huynh. is a 77 y.o. pleasant Caucasian male with history of prostate cancer s/p XRT, currently on chemo is seen in consultation for more than 1 month history of rectal pain and rectal bleeding  ?  Radiation proctitis Recommend colonoscopy for further evaluation   Follow up as needed   [name], MD

## 2022-07-21 ENCOUNTER — Encounter: Payer: Self-pay | Admitting: Gastroenterology

## 2022-07-24 ENCOUNTER — Encounter: Payer: Self-pay | Admitting: Gastroenterology

## 2022-07-24 NOTE — Anesthesia Preprocedure Evaluation (Addendum)
Anesthesia Evaluation  Patient identified by MRN, date of birth, ID band Patient awake    Reviewed: Allergy & Precautions, NPO status , Patient's Chart, lab work & pertinent test results  History of Anesthesia Complications Negative for: history of anesthetic complications  Airway Mallampati: III   Neck ROM: Full    Dental  (+) Missing   Pulmonary former smoker (quit 2022)   Pulmonary exam normal breath sounds clear to auscultation       Cardiovascular Normal cardiovascular exam Rhythm:Regular Rate:Normal  ECG 10/06/21:  SINUS RHYTHM WITH FREQUENT PREMATURE VENTRICULAR BEATS IN A PATTERN OF BIGEMINY  POSSIBLE INFERIOR INFARCT , AGE UNDETERMINED     Neuro/Psych negative neurological ROS     GI/Hepatic ,GERD  ,,  Endo/Other  negative endocrine ROS    Renal/GU negative Renal ROS   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 He has had no significant symptomatology. Last Zio patch showed 6.5% PVC burden. We will plan on repeating his Zio patch at his next clinic visit.  2. Chest pain Fairly well controlled symptomatology. No evidence of ischemic driven. Suspect may be secondary to radiation. No plans for further work-up at this time  3. 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 Given some of his description of his injury it would not surprise me if he has additional soft tissue involvement. He is requesting referral to orthopedic surgery which typically I defer to his primary but made the referral per his request. - Ambulatory referral to Orthopedic Surgery; Future  4. Risk of myocardial infarction or stroke 7.5% or greater in next 10  years Recommended the patient is a very high risk of cardiovascular events based on his ASCVD risk score. I strongly recommended that he should initiate statin therapy given his risk score. Patient deferred. He is going to work on his exercise and diet and is willing to discuss this further at a follow-up in 1 years time.  Return to clinic: Return in about 1 year (around 02/01/2023).    Reproductive/Obstetrics                             Anesthesia Physical Anesthesia Plan  ASA: 3  Anesthesia Plan: General   Post-op Pain Management:    Induction: Intravenous  PONV Risk Score and Plan: 2 and Propofol infusion, TIVA and Treatment may vary due to age or medical condition  Airway Management Planned: Natural Airway  Additional Equipment:   Intra-op Plan:   Post-operative Plan:   Informed Consent: I have reviewed the patients History and Physical, chart, labs and discussed the procedure including the risks, benefits and alternatives for the proposed anesthesia with the patient or authorized representative who has indicated his/her understanding and acceptance.       Plan Discussed with: CRNA  Anesthesia Plan Comments: (LMA/GETA backup discussed.  Patient consented for risks of anesthesia including but not limited to:  - adverse reactions to medications - damage to eyes, teeth, lips or other oral mucosa - nerve damage due to positioning  - sore throat or hoarseness - damage to heart, brain, nerves, lungs, other parts of body or loss of life  Informed patient about role  of CRNA in peri- and intra-operative care.  Patient voiced understanding.)        Anesthesia Quick Evaluation

## 2022-07-25 ENCOUNTER — Other Ambulatory Visit: Payer: Self-pay

## 2022-07-25 ENCOUNTER — Ambulatory Visit: Payer: No Typology Code available for payment source | Admitting: Anesthesiology

## 2022-07-25 ENCOUNTER — Encounter
Admission: RE | Disposition: A | Discharge status: Home / Self Care | Payer: Self-pay | Source: Ambulatory Visit | Attending: Gastroenterology

## 2022-07-25 ENCOUNTER — Encounter: Payer: Self-pay | Admitting: Gastroenterology

## 2022-07-25 ENCOUNTER — Ambulatory Visit
Admission: RE | Admit: 2022-07-25 | Discharge: 2022-07-25 | Disposition: A | Discharge status: Home / Self Care | Payer: No Typology Code available for payment source | Source: Ambulatory Visit | Attending: Gastroenterology | Admitting: Gastroenterology

## 2022-07-25 DIAGNOSIS — Z8546 Personal history of malignant neoplasm of prostate: Secondary | ICD-10-CM | POA: Diagnosis not present

## 2022-07-25 DIAGNOSIS — K627 Radiation proctitis: Secondary | ICD-10-CM

## 2022-07-25 DIAGNOSIS — Z923 Personal history of irradiation: Secondary | ICD-10-CM | POA: Diagnosis not present

## 2022-07-25 DIAGNOSIS — Z87891 Personal history of nicotine dependence: Secondary | ICD-10-CM | POA: Diagnosis not present

## 2022-07-25 DIAGNOSIS — K625 Hemorrhage of anus and rectum: Secondary | ICD-10-CM | POA: Diagnosis present

## 2022-07-25 DIAGNOSIS — C7951 Secondary malignant neoplasm of bone: Secondary | ICD-10-CM | POA: Insufficient documentation

## 2022-07-25 DIAGNOSIS — K5521 Angiodysplasia of colon with hemorrhage: Secondary | ICD-10-CM | POA: Insufficient documentation

## 2022-07-25 DIAGNOSIS — K219 Gastro-esophageal reflux disease without esophagitis: Secondary | ICD-10-CM | POA: Insufficient documentation

## 2022-07-25 HISTORY — PX: COLONOSCOPY WITH PROPOFOL: SHX5780

## 2022-07-25 SURGERY — COLONOSCOPY WITH PROPOFOL
Anesthesia: General | Site: Rectum

## 2022-07-25 MED ORDER — ACETAMINOPHEN 160 MG/5ML PO SOLN: 325.0000 mg | ORAL | Status: DC | PRN

## 2022-07-25 MED ORDER — ACETAMINOPHEN 325 MG PO TABS: 650.0000 mg | ORAL_TABLET | Freq: Once | ORAL | Status: DC | PRN

## 2022-07-25 MED ORDER — PROPOFOL 10 MG/ML IV BOLUS
INTRAVENOUS | Status: DC | PRN
  Administered 2022-07-25: 20 mg via INTRAVENOUS
  Administered 2022-07-25: 50 mg via INTRAVENOUS
  Administered 2022-07-25: 30 mg via INTRAVENOUS
  Administered 2022-07-25: 60 mg via INTRAVENOUS
  Administered 2022-07-25: 40 mg via INTRAVENOUS
  Administered 2022-07-25: 20 mg via INTRAVENOUS
  Administered 2022-07-25: 30 mg via INTRAVENOUS

## 2022-07-25 MED ORDER — SODIUM CHLORIDE 0.9 % IV SOLN: INTRAVENOUS | Status: DC

## 2022-07-25 MED ORDER — ONDANSETRON HCL 4 MG/2ML IJ SOLN: 4.0000 mg | Freq: Once | INTRAMUSCULAR | Status: DC | PRN

## 2022-07-25 MED ORDER — STERILE WATER FOR IRRIGATION IR SOLN
Status: DC | PRN
  Administered 2022-07-25: 1

## 2022-07-25 MED ORDER — LACTATED RINGERS IV SOLN: INTRAVENOUS | Status: DC

## 2022-07-25 MED ORDER — LIDOCAINE HCL (CARDIAC) PF 100 MG/5ML IV SOSY
PREFILLED_SYRINGE | INTRAVENOUS | Status: DC | PRN
  Administered 2022-07-25: 40 mg via INTRAVENOUS

## 2022-07-25 SURGICAL SUPPLY — 26 items
CLIP HMST 235XBRD CATH ROT (MISCELLANEOUS) IMPLANT
CLIP RESOLUTION 360 11X235 (MISCELLANEOUS)
ELECT REM PT RETURN 9FT ADLT (ELECTROSURGICAL)
ELECTRODE REM PT RTRN 9FT ADLT (ELECTROSURGICAL) IMPLANT
FCP ESCP3.2XJMB 240X2.8X (MISCELLANEOUS)
FORCEPS BIOP RAD 4 LRG CAP 4 (CUTTING FORCEPS) IMPLANT
FORCEPS BIOP RJ4 240 W/NDL (MISCELLANEOUS)
FORCEPS ESCP3.2XJMB 240X2.8X (MISCELLANEOUS) IMPLANT
GOWN CVR UNV OPN BCK APRN NK (MISCELLANEOUS) ×2 IMPLANT
GOWN ISOL THUMB LOOP REG UNIV (MISCELLANEOUS) ×2
INJECTOR VARIJECT VIN23 (MISCELLANEOUS) IMPLANT
KIT DEFENDO VALVE AND CONN (KITS) IMPLANT
KIT PRC NS LF DISP ENDO (KITS) ×1 IMPLANT
KIT PROCEDURE OLYMPUS (KITS) ×1
MANIFOLD NEPTUNE II (INSTRUMENTS) ×1 IMPLANT
MARKER SPOT ENDO TATTOO 5ML (MISCELLANEOUS) IMPLANT
PROBE APC CIRCUMFERENTIAL (PROBE) IMPLANT
PROBE APC STR FIRE (PROBE) IMPLANT
RETRIEVER NET ROTH 2.5X230 LF (MISCELLANEOUS) IMPLANT
SNARE COLD EXACTO (MISCELLANEOUS) IMPLANT
SNARE SHORT THROW 13M SML OVAL (MISCELLANEOUS) IMPLANT
SNARE SHORT THROW 30M LRG OVAL (MISCELLANEOUS) IMPLANT
SNARE SNG USE RND 15MM (INSTRUMENTS) IMPLANT
TRAP ETRAP POLY (MISCELLANEOUS) IMPLANT
VARIJECT INJECTOR VIN23 (MISCELLANEOUS)
WATER STERILE IRR 250ML POUR (IV SOLUTION) ×1 IMPLANT

## 2022-07-25 NOTE — H&P (Signed)
[name], MD [address]  [place]  [place], [place] [address]  Main: [phone]  Fax: [phone] Pager: [phone]  Primary Care Physician:  [facility]ration, [facility] Primary Gastroenterologist:  Dr. [name]  Pre-Procedure History & Physical: HPI:  Juan Huynh. is a 77 y.o. male is here for an colonoscopy.   Past Medical History:  Diagnosis Date   Arthritis    Cancer ([place])    Prostate Ca.  Metastisized to left scapula and pelvis.   Frequent PVCs    GERD (gastroesophageal reflux disease)     History reviewed. No pertinent surgical history.  Prior to Admission medications   Medication Sig Start Date End Date Taking? Authorizing Provider  abiraterone acetate (ZYTIGA) 250 MG tablet Take 750 mg by mouth daily.   Yes [provider]  calcium carbonate (TUMS EX) 750 MG chewable tablet Chew 1 tablet by mouth. 10/02/21  Yes [provider]  cetirizine (ZYRTEC) 10 MG tablet TAKE ONE TABLET BY MOUTH  PRN 04/29/21  Yes [provider]  Cholecalciferol 125 MCG (5000 UT) capsule Take by mouth.   Yes [provider]  predniSONE (DELTASONE) 5 MG tablet Take 5 mg by mouth daily.   Yes [provider]  SUPER B COMPLEX/C PO Take by mouth.   Yes [provider]  Zinc 50 MG TABS Take by mouth.   Yes [provider]  esomeprazole (NEXIUM) 20 MG capsule Take 20 mg by mouth daily at 12 noon. Patient not taking: Reported on 07/24/2022 09/02/21   [provider]  Melatonin 5 MG CAPS Take by mouth. Patient not taking: Reported on 07/24/2022    [provider]  Multiple Vitamin (MULTI-VITAMINS) TABS Take 1 tablet by mouth daily. Patient not taking: Reported on 07/24/2022    [provider]  Propylene Glycol 0.6 % SOLN Apply to eye as needed.    [provider]    Allergies as of 07/20/2022 - Review Complete 07/20/2022  Allergen Reaction Noted   Fluticasone  06/16/2021    Budesonide  03/05/2016    History reviewed. No pertinent family history.  Social History   Socioeconomic History   Marital status: Widowed    Spouse name: Not on file   Number of children: Not on file   Years of education: Not on file   Highest education level: Not on file  Occupational History   Not on file  Tobacco Use   Smoking status: Former    Types: Pipe, Cigars    Quit date: 05/2021    Years since quitting: 1.2   Smokeless tobacco: Never   Tobacco comments:    2 pipe bowls per day x 10 years; still may have a cigar less than once a month   Vaping Use   Vaping Use: Never used  Substance and Sexual Activity   Alcohol use: Not Currently    Alcohol/week: 9.0 standard drinks of alcohol    Types: 9 Standard drinks or equivalent per week    Comment: None since 11/22   Drug use: Never   Sexual activity: Yes    Birth control/protection: None  Other Topics Concern   Not on file  Social History Narrative   Not on file   Social Determinants of Health   Financial Resource Strain: Not on file  Food Insecurity: Not on file  Transportation Needs: Not on file  Physical Activity: Not on file  Stress: Not on file  Social Connections: Not on file  Intimate Partner Violence: Not on file    Review of Systems: See HPI, otherwise negative ROS  Physical Exam: BP (!) 132/90   Pulse 97   Temp 98.8 F (37.1 C) (Temporal)   Ht '5\' 9"'$  (1.753 m)   Wt 87.5 kg   SpO2 97%   BMI 28.50 kg/m  General:   Alert,  pleasant and cooperative in NAD Head:  Normocephalic and atraumatic. Neck:  Supple; no masses or thyromegaly. Lungs:  Clear throughout to auscultation.    Heart:  Regular rate and rhythm. Abdomen:  Soft, nontender and nondistended. Normal bowel sounds, without guarding, and without rebound.   Neurologic:  Alert and  oriented x4;  grossly normal neurologically.  Impression/Plan: Juan Huynh. is here for an colonoscopy to be performed for rectal bleeding, h/o  prostate cancer, s/p XRT  Risks, benefits, limitations, and alternatives regarding  colonoscopy have been reviewed with the patient.  Questions have been answered.  All parties agreeable.   Sherri Sear, MD  07/25/2022, 9:37 AM

## 2022-07-25 NOTE — Anesthesia Postprocedure Evaluation (Signed)
Anesthesia Post Note  Patient: Juan Huynh.  Procedure(s) Performed: COLONOSCOPY WITH PROPOFOL (Rectum)  Patient location during evaluation: PACU Anesthesia Type: General Level of consciousness: awake and alert, oriented and patient cooperative Pain management: pain level controlled Vital Signs Assessment: post-procedure vital signs reviewed and stable Respiratory status: spontaneous breathing, nonlabored ventilation and respiratory function stable Cardiovascular status: blood pressure returned to baseline and stable Postop Assessment: adequate PO intake Anesthetic complications: no   No notable events documented.   Last Vitals:  Vitals:   07/25/22 1045 07/25/22 1048  BP: 126/75   Pulse:  (!) 47  Resp: (!) 25 19  Temp:    SpO2: 96% 97%    Last Pain:  Vitals:   07/25/22 1045  TempSrc:   PainSc: 0-No pain                 Darrin Nipper

## 2022-07-25 NOTE — Op Note (Signed)
[facility] Gastroenterology Patient Name: Juan Huynh Procedure Date: 07/25/2022 9:52 AM MRN: [id number] Account #: [id number] Date of Birth: April 08, 1946 Admit Type: Outpatient Age: 77 Room: [facility] OR ROOM 01 Gender: Male Note Status: Finalized Instrument Name: [id number] Procedure:             Colonoscopy Indications:           Last colonoscopy: October 2014, Rectal bleeding Providers:             Lin Landsman MD, MD Referring MD:          veterans admnistration Medicines:             General Anesthesia Complications:         No immediate complications. Estimated blood loss: None. Procedure:             Pre-Anesthesia Assessment:                        - Prior to the procedure, a History and Physical was                         performed, and patient medications and allergies were                         reviewed. The patient is competent. The risks and                         benefits of the procedure and the sedation options and                         risks were discussed with the patient. All questions                         were answered and informed consent was obtained.                         Patient identification and proposed procedure were                         verified by the physician, the nurse, the                         anesthesiologist, the anesthetist and the technician                         in the pre-procedure area in the procedure room in the                         endoscopy suite. Mental Status Examination: alert and                         oriented. Airway Examination: normal oropharyngeal                         airway and neck mobility. Respiratory Examination:                         clear to auscultation. CV Examination: normal.  Prophylactic Antibiotics: The patient does not require                         prophylactic antibiotics. Prior Anticoagulants: The                         patient has taken no  anticoagulant or antiplatelet                         agents. ASA Grade Assessment: III - A patient with                         severe systemic disease. After reviewing the risks and                         benefits, the patient was deemed in satisfactory                         condition to undergo the procedure. The anesthesia                         plan was to use general anesthesia. Immediately prior                         to administration of medications, the patient was                         re-assessed for adequacy to receive sedatives. The                         heart rate, respiratory rate, oxygen saturations,                         blood pressure, adequacy of pulmonary ventilation, and                         response to care were monitored throughout the                         procedure. The physical status of the patient was                         re-assessed after the procedure.                        After obtaining informed consent, the colonoscope was                         passed under direct vision. Throughout the procedure,                         the patient's blood pressure, pulse, and oxygen                         saturations were monitored continuously. The                         Colonoscope was introduced through the anus and  advanced to the the terminal ileum, with                         identification of the appendiceal orifice and IC                         valve. The colonoscopy was performed without                         difficulty. The patient tolerated the procedure well.                         The quality of the bowel preparation was good. The                         terminal ileum, ileocecal valve, appendiceal orifice,                         and rectum were photographed. Findings:      The perianal and digital rectal examinations were normal. Pertinent       negatives include normal sphincter tone and no palpable  rectal lesions.      The terminal ileum appeared normal.      Multiple diffuse angiodysplastic lesions with stigmata of recent       bleeding were found in the mid rectum and in the distal rectum       consistent with radiation proctitis. Coagulation for hemostasis using       argon plasma was successful. Estimated blood loss: none.      The exam was otherwise without abnormality. Impression:            - The examined portion of the ileum was normal.                        - Multiple recently bleeding colonic angiodysplastic                         lesions. Treated with argon plasma coagulation (APC).                        - The examination was otherwise normal.                        - No specimens collected. Recommendation:        - Discharge patient to home (with escort).                        - Resume previous diet today.                        - Continue present medications.                        - Perform a flexible sigmoidoscopy for retreatment PRN. Procedure Code(s):     --- Professional ---                        [phone], Colonoscopy, flexible; with control of  bleeding, any method Diagnosis Code(s):     --- Professional ---                        K55.21, Angiodysplasia of colon with hemorrhage                        K62.5, Hemorrhage of anus and rectum CPT copyright 2022 American Medical Association. All rights reserved. The codes documented in this report are preliminary and upon coder review may  be revised to meet current compliance requirements. Dr. Ulyess Mort Lin Landsman MD, MD 07/25/2022 10:38:45 AM This report has been signed electronically. Number of Addenda: 0 Note Initiated On: 07/25/2022 9:52 AM Scope Withdrawal Time: 0 hours 22 minutes 42 seconds  Total Procedure Duration: 0 hours 29 minutes 21 seconds  Estimated Blood Loss:  Estimated blood loss: none.      [facility]

## 2022-07-25 NOTE — Transfer of Care (Signed)
Immediate Anesthesia Transfer of Care Note  Patient: Juan Huynh.  Procedure(s) Performed: COLONOSCOPY WITH PROPOFOL (Rectum)  Patient Location: PACU  Anesthesia Type: General  Level of Consciousness: awake, alert  and patient cooperative  Airway and Oxygen Therapy: Patient Spontanous Breathing and Patient connected to supplemental oxygen  Post-op Assessment: Post-op Vital signs reviewed, Patient's Cardiovascular Status Stable, Respiratory Function Stable, Patent Airway and No signs of Nausea or vomiting  Post-op Vital Signs: Reviewed and stable  Complications: No notable events documented.

## 2022-07-26 ENCOUNTER — Encounter: Payer: Self-pay | Admitting: Gastroenterology

## 2022-07-27 ENCOUNTER — Other Ambulatory Visit: Payer: Self-pay

## 2022-08-02 DIAGNOSIS — C61 Malignant neoplasm of prostate: Principal | ICD-10-CM

## 2022-08-02 MED ORDER — PREDNISONE 5 MG TABLET
ORAL_TABLET | Freq: Every day | ORAL | 11 refills | 30.00000 days | Status: CP
Start: 2022-08-02 — End: 2022-08-02

## 2022-08-02 NOTE — Unmapped (Signed)
[facility] Refill Coordination Note    Specialty Medication(s) to be Shipped:   Hematology/Oncology: abiraterone 250mg     Other medication(s) to be shipped: No additional medications requested for fill at this time     Eric Good, DOB: 1945-07-16  Phone: [phone] (home)       All above HIPAA information was verified with patient.     Was a Nurse, learning disability used for this call? No    Completed refill call assessment today to schedule patient's medication shipment from the [facility] [phone]).  All relevant notes have been reviewed.     Specialty medication(s) and dose(s) confirmed: Regimen is correct and unchanged.   Changes to medications: Eric Good reports no changes at this time.  Changes to insurance: No  New side effects reported not previously addressed with a pharmacist or physician: Yes - Patient reports dry cough. Patient would not like to speak to the pharmacist today. Their provider is aware.  Questions for the pharmacist: No    Confirmed patient received a Conservation officer, historic buildings and a Surveyor, mining with first shipment. The patient will receive a drug information handout for each medication shipped and additional FDA Medication Guides as required.       DISEASE/MEDICATION-SPECIFIC INFORMATION        N/A    SPECIALTY MEDICATION ADHERENCE     Medication Adherence    Patient reported X missed doses in the last month: 0  Specialty Medication: Abiraterone 250 mg  Patient is on additional specialty medications: No  Informant: patient                       Were doses missed due to medication being on hold? No    Abiraterone 250 mg: 14 days of medicine on hand       REFERRAL TO PHARMACIST     Referral to the pharmacist: Not needed      [facility]     Shipping address confirmed in Epic.     Delivery Scheduled: Yes, Expected medication delivery date: 08/11/22.     Medication will be delivered via Next Day Courier to the prescription address in Epic [place].    Eric Good   [facility] Specialty Technician

## 2022-08-10 MED FILL — ABIRATERONE 250 MG TABLET: ORAL | 30 days supply | Qty: 90 | Fill #6

## 2022-08-29 NOTE — Unmapped (Signed)
Eric Good 's abiraterone  shipment will be sent out  as a result of pt called back requested delivery change      and communicated the delivery change. We will reschedule the medication for the delivery date that the patient agreed upon.  We have confirmed the delivery date as 03/11, via same day courier.

## 2022-08-29 NOTE — Unmapped (Signed)
[facility] Refill Coordination Note    Specialty Medication(s) to be Shipped:   Hematology/Oncology: abiraterone 250mg     Other medication(s) to be shipped: No additional medications requested for fill at this time     Eric Good, DOB: 1945-08-14  Phone: [phone] (home)       All above HIPAA information was verified with patient.     Was a Nurse, learning disability used for this call? No    Completed refill call assessment today to schedule patient's medication shipment from the [facility] [phone]).  All relevant notes have been reviewed.     Specialty medication(s) and dose(s) confirmed: Regimen is correct and unchanged.   Changes to medications: Masen reports no changes at this time.  Changes to insurance: No  New side effects reported not previously addressed with a pharmacist or physician: None reported  Questions for the pharmacist: No    Confirmed patient received a Conservation officer, historic buildings and a Surveyor, mining with first shipment. The patient will receive a drug information handout for each medication shipped and additional FDA Medication Guides as required.       DISEASE/MEDICATION-SPECIFIC INFORMATION        N/A    SPECIALTY MEDICATION ADHERENCE     Medication Adherence    Patient reported X missed doses in the last month: 0  Specialty Medication: Abiraterone 250mg   Patient is on additional specialty medications: No  Informant: patient     Were doses missed due to medication being on hold? No    Abiraterone 250 mg: 14 days of medicine on hand       REFERRAL TO PHARMACIST     Referral to the pharmacist: Not needed      [facility]     Shipping address confirmed in Epic.     Delivery Scheduled: Yes, Expected medication delivery date: 09/12/22.     Medication will be delivered via Next Day Courier to the prescription address in Epic [place].    Eric Good   [facility] Specialty Technician

## 2022-09-05 NOTE — Unmapped (Signed)
Reason for visit  Follow up MRI    History of present illness  I did a virtual visit with Eric Good to follow up recent MRI.  We discussed that findings showed a scapular lesion consistent with a metastatic lesion.  I advised that I had reviewed imaging as well as radiology report.  Overall, findings appeared stable compared with prior CT.  There are also some degenerative changes of the shoulder joint.  We discussed that there were no operative indications for the scapular lesion at this time.  Discussed role of observation and possibility of repeat MRI in the future to reassess.  He has ongoing follow up in the meantime with Dr Philomena Course.  All questions answered.        The patient reports they are physically located in [place] and is currently: at home. I conducted a phone visit.  I spent 4 minutes on the phone call with the patient on the date of service .

## 2022-09-11 MED FILL — ABIRATERONE 250 MG TABLET: ORAL | 30 days supply | Qty: 90 | Fill #7

## 2022-09-20 NOTE — Unmapped (Incomplete)
It was a pleasure to see you in the clinic today.       Lab Results   Component Value Date    PSA <0.04 06/13/2022    PSA 0.05 04/24/2022    PSA 0.06 04/03/2022    PSA <0.04 02/27/2022    PSA 0.04 01/31/2022    PSA 0.08 01/02/2022     Please call [phone] to reach my nurse navigator Konrad Penta for any issues.    For emergencies on Nights, Weekends and Holidays  Call [phone] for help.      Griffin Basil, MD, PhD  Associate Professor of Medicine  Division of Oncology    [facility]  Genitourinary Oncology Clinic  Nurse Navigator: Konrad Penta  Fax: [phone]

## 2022-09-20 NOTE — Unmapped (Signed)
[facility] Medical Oncology Visit Note    Patient Name: Eric Good  Patient Age: 77 y.o.  Encounter Date: 09/21/2022  Attending Provider:  Darrelyn Morro E. 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    Eric Good is a 89 y.o. man with newly diagnosed de novo metastatic prostate cancer, T2N1M1a, involving pelvic and retroperitoneal lymph nodes on conventional CT and bone scans. PSA being 45 and Gleason 4+5=9 disease also support this diagnosis.    I discussed with the patient the long term prognosis of prostate cancer and treatment options, such as androgen deprivation therapy, and benefit and side effects of ADT.  Side effects includes but are not limited to loss of libido, erectile dysfunction, hot flashes, fatigue, weight gain, metabolic syndrome that increases the risk of diabetes and cardiovascular events, osteoporosis, and reduced muscle mass.  I have counseled the patient on the need to take calcium and vitamin D as well as regular weight bearing exercise to minimize the side effects of ADT. It is expected that virtually all patients will develop progressive disease while on ADT and at that time additional treatment would be required, but ultimately, the patient may pass away from prostate cancer.    A series of recent studies demonstrate the benefit of additional therapy (docetaxel per CHAARTED and STAMPEDE, abiraterone/prednisone per LATITUTUDE, enzalutamide per ARCHES and ENZAMET, apalutamide per TITAN).  Therefore, the current option would be to add one of AR targeted agents. Docetaxel chemo would not be recommended for low volume patients.  I discussed the consideration of additional therapy and the benefit and side effects of an agent such as abiraterone.    On 06/16/21, patient returns after PSMA-PET, which showed uptake in the prostate, multiple retrocaval and left iliac lymph nodes, and the left scapular body, concerning for metastatic disease. We discussed the implications of these findings and re-discussed the risks and benefits of ADT as well as the possible benefit from the addition of an AR-targeted therapy. Patient elected today to start treatment with ADT.    In 07/2021, PSA down on ADT. After discussion of benefit/side effects, abiraterone and prednisone prescribed. Will get somatic tumor genomic testing. Also referral to radiation oncology.    In 08/2021, PSA is 1.33. His LFT's are okay. I recommend obtaining germline testing at his next visit. I also spoke to him about joining the [employer] study. Pt was agreeable to both.    In 10/2021, PSA is 0.39. His LFT's are slightly elevated.  We will continue to monitor LFT's every 2 weeks and hope to see improvement/normalization.  Otherwise tolerating ADT, abi, and pred well. I'll see him back in mid-June for his next dose of Eligard. He was also noted to have bradycardia on vitals.  EKG today without bradycardia but noted ventricular bigeminy/frequent PVCs.  He is asymptomatic today but did have an episode of chest pain for 20 minutes 4 nights ago.  We will have him see cardiology again.     In  01/2022, doing well. Nothing remarkable on somatic/germline testing. PSA, labs great. Continue with current treatment of ADT plus abiraterone.    On 06/13/2022, PSA is undetectable today. DEXA results show T -1.3.  MRI pelvis to r/o prostate invading into rectum.    Today on 09/21/2022, PSA is undetectable. Prostate MRI on 07/04/2022 showed restricted diffusion at the right base of the prostate, suspicious for locally recurrent prostate carcinoma. No evidence of rectal invasion. MRI of L spine ordered for back pain and to follow up on L4 compression fracture. Continue current therapy.     Plan  Continue ADT with Eligard 45 mg subcutaneous, given last on 06/13/2022, due next on or after 12/13/2022 per therapy plan  Abiraterone 1000 mg every day and prednisone 5 mg daily. Follow LFT's, K, BP.  Pt will have completed 2 years of therapy in 07/2023.  Completed IMRT to the prostate and pelvic nodes and SRBT to the left scapula and prostate on 09/05/2021.   Somatic tumor mutation profiling done, no targetable mutations  Germline testing negative  Ordered MRI for back pain and L4 compression fracture.  Repeat DEXA scan in 06/2024  Return in 3 months for injection and follow up with labs    I personally spent 40 minutes face-to-face and non-face-to-face in the care of this patient, which includes all pre, intra, and post visit time on the date of service.  All documented time was specific to the E/M visit and does not include any procedures that may have been performed.      Reason for Visit  Follow up of prostate cancer    History of Present Illness:  Hematology/Oncology History Overview Note   In 04/2021, screening PSA of 49, then 45.3. DRE abnormal, firm at L base  In 05/2021, prostate biopsy showed Gleason 4+5=9, 6/12 cores involved. CT showed retroperitoneal and pelvic adenopathy. Bone scan showed single focus in L scapula, nonspecific.  In 06/2021, PSMA PET showed retroperitoneal/pelvic node mets, L scapular met.  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)   06/16/2021 Initial Diagnosis    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 Patient reports uncomfortable pain from lifting an object ~3 weeks ago. Lumbar Spine X ray done on 09/17/2022 by Urgent care showed anterior wedging of L4 and urgent care provider made the  diagnosis of L4 compression fracture. Reports rectal pain is mainly resolved.       Allergies:  Allergies   Allergen Reactions    Flonase [Fluticasone]      Nose bleed    Budesonide Nausea Only     Other reaction(s): Other (See Comments)    Epistaxis       Current Medications:    Current Outpatient Medications:     abiraterone (ZYTIGA) 250 mg tablet, Take 3 tablets (750 mg total) by mouth daily., Disp: 90 tablet, Rfl: 11    calcium carbonate 300 mg (750 mg) Chew, , Disp: , Rfl:     cholecalciferol, vitamin D3-125 mcg, 5,000 unit,, 125 mcg (5,000 unit) tablet, Take 1 tablet (125 mcg total) by mouth two (2) times a day., Disp: , Rfl:     esomeprazole (NEXIUM) 20 MG capsule, Take 1 capsule (20 mg total) by mouth in the morning., Disp: , Rfl:     fish oil-omega-3 fatty acids 300-1,000 mg capsule, Take 2 capsules (2 g total) by mouth daily., Disp: , Rfl:     loperamide HCl (IMODIUM A-D ORAL), , Disp: , Rfl:     melatonin 3 mg Tab, Take 1 tablet (3 mg total) by mouth nightly as needed., Disp: , Rfl:     methocarbamol (ROBAXIN) 500 MG tablet, TAKE 1 TABLET BY MOUTH THREE TIMES DAILY FOR 7 DAYS AS NEEDED, Disp: , Rfl:     multivitamin (TAB-A-VITE/THERAGRAN) per tablet, Take 1 tablet by mouth daily., Disp: , Rfl:     olopatadine (PATADAY ONCE DAILY RELIEF) 0.7 % ophthalmic solution, Administer 1 drop to both eyes daily., Disp: , Rfl:     predniSONE (DELTASONE) 5 MG tablet, Take 1 tablet (5 mg total) by mouth daily., Disp: 30 tablet, Rfl: 11    vitamin B complex vit C no.3 (VITAMIN B COMP AND C NO.3 ORAL), Take by mouth daily as needed. , Disp: , Rfl:     vitamin D3-folic acid 125 mcg (5,000 unit)-1 mg Tab, Take 125 mcg by mouth in the morning., Disp: , Rfl:     zinc gluconate 50 mg (7 mg elemental zinc) tablet, Take 1 tablet (50 mg total) by mouth daily., Disp: , Rfl:     Past Medical History and Social History  Past Medical History:   Diagnosis Date    Rapid heart beat     Negative cardiac workup in 2020      No past surgical history on file.     Social History     Occupational History    Not on file   Tobacco Use    Smoking status: Some Days     Types: Cigars     Start date: 12/11/2016    Smokeless tobacco: Never   Substance and Sexual Activity    Alcohol use: Not on file    Drug use: Not on file    Sexual activity: Not on file       Family History    Prostate Cancer Family History Assessment:  History of cancer in children (yes/no; if yes, what type AND age of diagnosis): No  History of cancer in siblings (yes/no; if yes, provide relation, type of cancer, AND age of diagnosis): No  History of cancer in parents (yes/no; if yes, please specify parent, type of cancer, AND age of diagnosis): Yes, sister with stomach and eye cancer, age 11  History of cancer in aunts/uncles/grandparents (yes/no; if yes, provide relation, type of cancer, AND age of diagnosis):  No      Review of Systems:  A comprehensive review of 10 systems was negative except for pertinent positives noted in HPI.    Physical Examination:    VITAL SIGNS:  BP 144/86  - Pulse 62  - Temp 37 ??C (98.6 ??F) (Temporal)  - Resp 18  - Ht 174 cm (5' 8.5)  - Wt 91.4 kg (201 lb 8 oz)  - SpO2 98%  - BMI 30.19 kg/m??   ECOG Performance Status: 1  GENERAL: Well-developed, well-nourished patient in no acute distress.  HEAD: Normocephalic and atraumatic.  EYES: Conjunctivae are normal. No scleral icterus.  MOUTH/THROAT: Oropharynx is clear and moist.  No mucosal lesions.  NECK: Supple, no thyromegaly.  LYMPHATICS: No palpable cervical, supraclavicular, or axillary adenopathy.  CARDIOVASCULAR: Normal rate, regular rhythm and normal heart sounds.  Exam reveals no gallop and no friction rub.  No murmur heard.  PULMONARY/CHEST: Effort normal and breath sounds normal. No respiratory distress.  GASTROINTESTINAL/ABDOMINAL:  Soft. There is no distension. There is no tenderness. There is no rebound and no guarding.  MUSCULOSKELETAL: No clubbing, cyanosis, or lower extremity edema.  PSYCHIATRIC: Alert and oriented.  Normal mood and affect.  NEUROLOGIC: No focal motor deficit. Normal gait.  SKIN: Skin is warm, dry, and intact.       Results/Orders:      Appointment on 09/21/2022   Component Date Value Ref Range Status    Potassium 09/21/2022 4.6  3.5 - 5.1 mmol/L Final    PSA 09/21/2022 <0.04  0.00 - 4.00 ng/mL Final    Albumin 09/21/2022 3.8  3.4 - 5.0 g/dL Final    Total Protein 09/21/2022 6.2  5.7 - 8.2 g/dL Final    Total Bilirubin 09/21/2022 0.5  0.3 - 1.2 mg/dL Final    Bilirubin, Direct 09/21/2022 0.20  0.00 - 0.30 mg/dL Final    AST 16/04/9603 22  <=34 U/L Final    ALT 09/21/2022 17  10 - 49 U/L Final    Alkaline Phosphatase 09/21/2022 78  46 - 116 U/L Final     Lab Results   Component Value Date    PSA <0.04 09/21/2022    PSA <0.04 06/13/2022    PSA 0.05 04/24/2022    PSA 0.06 04/03/2022    PSA <0.04 02/27/2022    PSA 0.04 01/31/2022         Orders placed or performed in visit on 09/21/22    MRI Lumbar Spine [facility] Contrast    Clinic Appointment Request Physician    Clinic Appointment Request Physician, Injection    LAB Appointment Request           Molecular Pathology  Tumor mutation profiling on 08/01/18  TMB 0 m/Mb  MSS  APC  MTA  PTEN  CDKN2A/B  CIC  TP53    Germline testing [facility] on 09/12/21  Negative    Pathology  05/04/2021  [A] PROSTATE, LEFT BASE:   ACINAR ADENOCARCINOMA, GLEASON 4+3=7  (GG   3), INVOLVING 1 OF 1 CORES, MEASURING 5  MM ( 71%). 80% PATTERN 4;   CRIBRIFORM PATTERN 4 PRESENT    [B] PROSTATE, LEFT MID:   ACINAR ADENOCARCINOMA, GLEASON 4+3=7  (GG 3),   INVOLVING 1 OF 1 CORES, MEASURING 5  MM ( 83%). 80% PATTERN 4;   CRIBRIFORM PATTERN 4 PRESENT    [C] PROSTATE, LEFT APEX:   ACINAR ADENOCARCINOMA, GLEASON 4+3=7  (GG   3), INVOLVING 1 OF 2 CORES, MEASURING 4  MM (  40%). 70% PATTERN 4;   CRIBRIFORM PATTERN 4 PRESENT    [D] PROSTATE, RIGHT BASE:   NEGATIVE FOR MALIGNANCY.    [E] PROSTATE, RIGHT MID:   NEGATIVE FOR MALIGNANCY.    [F] PROSTATE, RIGHT APEX:   NEGATIVE FOR MALIGNANCY.    [G] PROSTATE, LEFT LATERAL BASE:   ACINAR ADENOCARCINOMA, GLEASON 4+4=8   (GG 4), INVOLVING 1 OF 1 CORES, MEASURING 5  MM ( 50%). PERINEURAL   INVASION PRESENT    [H] PROSTATE, LEFT LATERAL MID:   ACINAR ADENOCARCINOMA, GLEASON 4+5=9   (GG 5), INVOLVING 1 OF 1 CORES, MEASURING 9  MM ( 100%). CRIBRIFORM   PATTERN 4 PRESENT    [I] PROSTATE, LEFT LATERAL APEX:   ACINAR ADENOCARCINOMA, GLEASON 4+3=7   (GG 3), INVOLVING 2 OF 2 CORES, MEASURING 8  MM ( 100%). 80% PATTERN 4;   CRIBRIFORM PATTERN 4 PRESENT; PERINEURAL INVASION PRESENT    [J] PROSTATE, RIGHT LATERAL BASE:   NEGATIVE FOR MALIGNANCY.    [K] PROSTATE, RIGHT LATERAL MID:   NEGATIVE FOR MALIGNANCY.    [L] PROSTATE, RIGHT LATERAL APEX:   NEGATIVE FOR MALIGNANCY.     Imaging results:  CT AP 05/19/2021  LYMPH NODES: Retroperitoneal and pelvic adenopathy with index lesions as follows  --1.5 cm retrocaval lymph node  -- 1.4 cm left common iliac lymph node.  --1.9 cm left external iliac lymph node  --1.8 cm left internal iliac lymph  node  IMPRESSION:  Retroperitoneal and  left pelvic adenopathy, suspicious for metastatic disease.     Heterogeneous appearance of the prostate, consistent with known neoplasm.     Indeterminate lesion in the right posterior 12th rib. Recommend correlation with concurrent bone scan.     Mildly hyperenhancing right renal lesion, suspicious for cortical neoplasm. Recommend correlation with prior imaging if available. If not, MRI of the abdomen with and without contrast may be performed for definitive characterization.    Bone scan 05/19/2021  Focal area of increased radiotracer uptake within the left scapula which could represent osseous metastasis or remote trauma, indeterminate      PET CT Skull Base to Thigh 06/13/2021  -Mild radiotracer uptake within the prostate, likely corresponding to patient's known neoplasm.   -Multiple radiotracer avid retrocaval and left iliac lymph nodes, as described above, concerning for metastatic disease. Tiny right iliac/pelvic sidewall lymph nodes with no significant uptake, indeterminate.   -Radiotracer uptake within the left scapular body, corresponding to increased tracer uptake on recent bone scan, concerning for osseous metastatic disease.    MRI Upper Extremity Non-Joint Left W Wo Contrast  Result Date: 05/02/2022   Impression: 1.Enhancing lytic lesion seen within the scapula compatible with known metastasis which appears overall similar to prior CT allowing for differences in modality. 2.Surrounding intramuscular edema and enhancement and areas of possible muscle necrosis. Findings are favored to represent posttreatment changes in setting of recent radiation therapy. 3.Glenohumeral degenerative changes with small joint effusion and degenerative labral tearing/chondrosis.      DEXA bone scan  FINDINGS  Lumbar Spine   Excluded levels: L4  BMD: 0.963 (g/cm)   T score: -1.0  Lumbar WHO classification: NORMAL BONE MINERAL DENSITY.     Left Hip   Femoral Neck:   BMD: 0.747 (g/cm)   T score: -1.3  Total Hip   BMD: 1.003 (g/cm)   T score: -0.2  Hip WHO classification: LOW BONE MASS.     MRI Prostate W Wo Contrast (07/04/2022)  Impression: Restricted diffusion identified at the  right base of the prostate at the region of the proximal right seminal vesicle. Findings are suspicious for locally recurrent prostate carcinoma. Could consider correlation with biopsy or PSMA PET. DynaCAD imaging sent to PACS PI-RADS v2.1 Assessment Categories PI-RADS 1  Very low (clinically significant cancer is highly unlikely to be present) PI-RADS 2  Low (clinically significant cancer is unlikely to be present) PI-RADS 3  Intermediate (the presence of clinically significant cancer is equivocal) PI-RADS 4  High (clinically significant cancer is likely to be present) PI-RADS 5  Very high (clinically significant cancer is highly likely to be present      I attest that I, Shearon Stalls, personally documented this note while acting as scribe for Maurie Boettcher, MD       Shearon Stalls, Scribe.  09/21/2022       Documentation assistance provided by Medical Scribe, Shearon Stalls, who was present during the entirety of the visit. I reviewed the note below and validated all of the information provided to ensure accuracy and completeness.      Maurie Boettcher, MD

## 2022-09-21 ENCOUNTER — Ambulatory Visit
Admit: 2022-09-21 | Discharge: 2022-09-22 | Payer: MEDICARE | Attending: Hematology & Oncology | Primary: Hematology & Oncology

## 2022-09-21 ENCOUNTER — Ambulatory Visit: Admit: 2022-09-21 | Discharge: 2022-09-22 | Payer: MEDICARE

## 2022-09-21 LAB — HEPATIC FUNCTION PANEL
ALBUMIN: 3.8 g/dL (ref 3.4–5.0)
ALKALINE PHOSPHATASE: 78 U/L (ref 46–116)
ALT (SGPT): 17 U/L (ref 10–49)
AST (SGOT): 22 U/L (ref <=34)
BILIRUBIN DIRECT: 0.2 mg/dL (ref 0.00–0.30)
BILIRUBIN TOTAL: 0.5 mg/dL (ref 0.3–1.2)
PROTEIN TOTAL: 6.2 g/dL (ref 5.7–8.2)

## 2022-09-21 LAB — POTASSIUM: POTASSIUM: 4.6 mmol/L (ref 3.5–5.1)

## 2022-09-21 LAB — PSA: PROSTATE SPECIFIC ANTIGEN: <0.04 ng/mL (ref 0.00–4.00)

## 2022-10-04 NOTE — Unmapped (Signed)
[facility] Refill Coordination Note    Specialty Medication(s) to be Shipped:   Hematology/Oncology: abiraterone 250mg     Other medication(s) to be shipped: No additional medications requested for fill at this time     Eric Good, DOB: 1945/07/19  Phone: [phone] (home)       All above HIPAA information was verified with patient.     Was a Nurse, learning disability used for this call? No    Completed refill call assessment today to schedule patient's medication shipment from the [facility] [phone]).  All relevant notes have been reviewed.     Specialty medication(s) and dose(s) confirmed: Regimen is correct and unchanged.   Changes to medications: [name] reports no changes at this time.  Changes to insurance: No  New side effects reported not previously addressed with a pharmacist or physician: None reported  Questions for the pharmacist: No    Confirmed patient received a Conservation officer, historic buildings and a Surveyor, mining with first shipment. The patient will receive a drug information handout for each medication shipped and additional FDA Medication Guides as required.       DISEASE/MEDICATION-SPECIFIC INFORMATION        N/A    SPECIALTY MEDICATION ADHERENCE     Medication Adherence    Patient reported X missed doses in the last month: 0  Specialty Medication: Abiraterone 250mg   Patient is on additional specialty medications: No  Informant: patient     Were doses missed due to medication being on hold? No    Abiraterone 250 mg: 14 days of medicine on hand       REFERRAL TO PHARMACIST     Referral to the pharmacist: Not needed      [facility]     Shipping address confirmed in Epic.     Delivery Scheduled: Yes, Expected medication delivery date: 10/13/22.     Medication will be delivered via Next Day Courier to the prescription address in Epic [place].    Eric Good   [facility] Specialty Technician

## 2022-10-05 ENCOUNTER — Ambulatory Visit: Admit: 2022-10-05 | Discharge: 2022-10-06 | Payer: MEDICARE

## 2022-10-05 MED ADMIN — gadobenate dimeglumine (MULTIHANCE) 529 mg/mL (0.1mmol/0.2mL) solution 15 mL: 15 mL | INTRAVENOUS | @ 23:00:00 | Stop: 2022-10-05

## 2022-10-06 DIAGNOSIS — S32010A Wedge compression fracture of first lumbar vertebra, initial encounter for closed fracture: Principal | ICD-10-CM

## 2022-10-06 DIAGNOSIS — S32040A Wedge compression fracture of fourth lumbar vertebra, initial encounter for closed fracture: Principal | ICD-10-CM

## 2022-10-10 ENCOUNTER — Ambulatory Visit: Payer: No Typology Code available for payment source | Admitting: Gastroenterology

## 2022-10-12 MED FILL — ABIRATERONE 250 MG TABLET: ORAL | 30 days supply | Qty: 90 | Fill #8

## 2022-10-19 NOTE — Unmapped (Signed)
RN returned call to patient to explain reason for appointment May 08/2022 with Dr. Jadene Pierini. Consultation for Kyphoplasty. Pt appreciative of call, verbalized understanding. No further questions of concerns at this time.

## 2022-11-02 ENCOUNTER — Ambulatory Visit
Admit: 2022-11-02 | Discharge: 2022-11-03 | Payer: MEDICARE | Attending: Diagnostic Radiology | Primary: Diagnostic Radiology

## 2022-11-02 DIAGNOSIS — S32040A Wedge compression fracture of fourth lumbar vertebra, initial encounter for closed fracture: Principal | ICD-10-CM

## 2022-11-02 DIAGNOSIS — M8008XA Age-related osteoporosis with current pathological fracture, vertebra(e), initial encounter for fracture: Principal | ICD-10-CM

## 2022-11-02 NOTE — Unmapped (Signed)
Patient Name: Eric Good  Patient Age: 77 y.o.  Encounter Date: 11/02/22   Attending Interventional Radiologist: Dr. Benjamine Mola  Resident Interventional Radiologist: Jonell Cluck, MD    Referring Physician: Maurie Boettcher, MD  [address]  [id number]  [place],  [place] [address]  Primary Care Provider: Gracelyn Nurse, MD      SUBJECTIVE      Reason for Visit: Kyphoplasty    History of Present Illness: Eric Good is a 77 y.o. male who is seen in consultation at the request of Maurie Boettcher for evaluation of an L4 vertebral compression fracture.  Mr. Pricilla Good was attempting to lift a trailer in February, when he felt a popin his back associated with immediate pain.  He initially thought this was a bad sprain, but had severe pain (10/10 in severity with movement, bending, or twisting) which did not resolve.  He eventually underwent lumbar spine MRI for evaluation, which demonstrated a compression fracture of the L4 vertebral body with approximately 60% height loss in the mid body of the vertebra with associated STIR changes.  Since then, his pain has decreased somewhat in overall intensity but persists such that now he has approximately 4 out of 10 pain at rest and 6-7 out of 10 with movement, bending, or twisting.  In particular, this limits his activities of daily living, as even getting out of bed or rising from a chair induce pain.    Past medical history is otherwise significant for prostate cancer, for which she is followed by Dr. Philomena Course here at [facility].  At this time, there is concern for locally recurrent disease within the prostate, but no evidence of metastatic disease (including no known bony involvement) and last PSA was less than 0.04.  No prior history of osteopenia/osteoporosis, though some of his medications are known to decrease bone density.    At this time, he is significantly uncomfortable and interested in interventional options to stabilize the current fracture and prevent it from worsening.  He would prefer procedures to be performed with as little sedation as possible to minimize risks of anesthesia.      Past Medical History:  Past Medical History:   Diagnosis Date    Rapid heart beat     Negative cardiac workup in 2020       Past Surgical History:  No past surgical history on file.    Family History:  History reviewed. No pertinent family history.     Allergies:  Allergies   Allergen Reactions    Flonase [Fluticasone]      Nose bleed    Budesonide Nausea Only     Other reaction(s): Other (See Comments)    Epistaxis        Anticoagulant/Antiplatelet Medications: No anticoagulant medications    OBJECTIVE     Physical Exam:  Vitals:    11/02/22 0812   BP: 144/79   Pulse: 68   Resp: 14   Temp: 36.4 ??C (97.6 ??F)   SpO2: 99%     Body mass index is 28.8 kg/m??.    General: alert, in no acute distress, well-nourished, but cautious and stiff with movement.  Respiratory: breathing comfortably on room air  Cardiovascular: regular rate, peripherally warm and well-perfused  MSK: Moderate focal tenderness to palpation in the lower lumbar spine at midline.  Very mild tenderness to palpation along the left paraspinal musculature in the same region.    Point-of-care US performed: Not performed  Pertinent Laboratory Values:  Lab Results   Component Value Date    WBC 6.7 06/16/2021    HGB 15.6 06/16/2021    HCT 46.4 06/16/2021    PLT 193 06/16/2021     Lab Results   Component Value Date    NA 139 06/16/2021    K 4.6 09/21/2022    PHOS 3.2 12/12/2018    MG 1.9 12/12/2018     No results found for: PTINR, APTT    11/02/2022, labs were reviewed by me personally. Interpretation: Appropriately low PSA; based on labs recommend no further labs required.    Pertinent Imaging Studies: 11/02/2022, lumbar spine MRI from 10/05/2022 and PET/CT from 06/13/2021 was visualized and reviewed by me personally along with Dr. Nathaneil Canary. Interpretation: Subacute compression fracture of the L4 vertebral body with associated STIR changes and height loss consistent with ongoing instability with marrow edema. Based on imaging recommend procedural intervention as discussed below.    ASA Score: ASA 2 - Patient with mild systemic disease with no functional limitations    Performance Status: 1 = Restricted in physically strenuous activity but ambulatory and able to carry out work of a light or sedentary nature, e.g., light house work, office work    ASSESSMENT/PLAN     Kainin Hosp is a 77 y.o. male with provoked L4 vertebral compression fracture in February 2024 resulting in ongoing pain and limitation of ADLs.  Of note, he has a known past medical history of prostate cancer, though with no known metastatic bony disease.    I discussed the various treatment options available. At this time, we feel that he would benefit most from Kyphoplasty. The risks, benefits and alternatives were fully discussed including bleeding, infection, cement spillage , paralysis , access site bleeding, incomplete pain relief, and damage to adjacent structures/organs. The patient's questions were all answered to his satisfaction.       Recommendation/plan:  -- Proceed with kyphoplasty with Dr. Nathaneil Canary  -- Procedure will be scheduled at the earliest mutually available date & time - likely to be performed at the [facility] pending final approval. Our scheduling team will call the patient once this has been finalized.   -- Anticoagulant medication hold required: NA  --Planned level of sedation: Moderate sedation  --Positioning: prone  -- Notes reviewed:Dr. Philomena Course progress note from 09/21/22      Informed consent obtained:   Yes---This procedure has been fully reviewed with the patient/patient's authorized representative. The risks, benefits and alternatives have been explained, and the patient/patient's authorized representative has consented to the procedure. --The patient will accept blood products in an emergent situation. --The patient does not have a Do Not Resuscitate order in effect.    Class 1 - Can visualize soft palate, fauces, uvula, and tonsillar pillars    Patient seen and discussed with Dr. Benjamine Mola, who agrees with the above assessment and plan. Thank you for involving [place] in the care of this patient.    Eustace Moore. Lavena Stanford, Diagnostic & Interventional Radiology  [phone]

## 2022-11-02 NOTE — Unmapped (Signed)
Patient in clinic today to have a consultation for kyphoplasty with Dr. Nathaneil Canary, VIR provider MD. VSS. Alert and Oriented x4. Allergies and home medications reviewed and documented. Consent available at bedside if patient chooses to go forward with procedure presented by VIR provider MD/APP, consent will be verified and signed by RN, scanned to patient chart. RN provided education and reading materials to patient. No further questions or concerns at this time. RN offered clinic number for patient contact.

## 2022-11-03 NOTE — Unmapped (Signed)
[facility] Refill Coordination Note    Specialty Medication(s) to be Shipped:   Hematology/Oncology: abiraterone 250mg     Other medication(s) to be shipped: No additional medications requested for fill at this time     Eric Good, DOB: [birth year]  Phone: [phone] (home)       All above HIPAA information was verified with patient.     Was a Nurse, learning disability used for this call? No    Completed refill call assessment today to schedule patient's medication shipment from the [facility] [phone]).  All relevant notes have been reviewed.     Specialty medication(s) and dose(s) confirmed: Regimen is correct and unchanged.   Changes to medications: Eric Good reports no changes at this time.  Changes to insurance: No  New side effects reported not previously addressed with a pharmacist or physician: None reported  Questions for the pharmacist: No    Confirmed patient received a Conservation officer, historic buildings and a Surveyor, mining with first shipment. The patient will receive a drug information handout for each medication shipped and additional FDA Medication Guides as required.       DISEASE/MEDICATION-SPECIFIC INFORMATION        N/A    SPECIALTY MEDICATION ADHERENCE     Medication Adherence    Patient reported X missed doses in the last month: 0  Specialty Medication: abiraterone 250 mg tablet (ZYTIGA)  Patient is on additional specialty medications: No  Patient is on more than two specialty medications: No  Any gaps in refill history greater than 2 weeks in the last 3 months: no  Demonstrates understanding of importance of adherence: yes  Informant: patient  Reliability of informant: reliable  Provider-estimated medication adherence level: good  Patient is at risk for Non-Adherence: No  Reasons for non-adherence: no problems identified  Confirmed plan for next specialty medication refill: delivery by pharmacy  Refills needed for supportive medications: not needed          Refill Coordination    Has the Patients' Contact Information Changed: No  Is the Shipping Address Different: No         Were doses missed due to medication being on hold? No    abiraterone 250 mg: 14 days of medicine on hand       REFERRAL TO PHARMACIST     Referral to the pharmacist: Not needed      [facility]     Shipping address confirmed in Epic.       Delivery Scheduled: Yes, Expected medication delivery date: 05/10.     Medication will be delivered via Next Day Courier to the prescription address in Epic [place].    Eric Good   [facility] Specialty Technician

## 2022-11-09 MED FILL — ABIRATERONE 250 MG TABLET: ORAL | 30 days supply | Qty: 90 | Fill #9

## 2022-11-09 NOTE — Unmapped (Signed)
Questions answered, contact information provided.

## 2022-11-23 NOTE — Unmapped (Signed)
VIR pre procedure prep call completed. Reviewed to arrive 1 hour prior to appointment time- 1000. Informed of no show/late cancellation policy. NPO guidelines reviewed. Pt OK to take sips of clear liquids with all AM meds. 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 Pt verbalized understanding. All questions answered.

## 2022-11-28 ENCOUNTER — Ambulatory Visit: Admit: 2022-11-28 | Discharge: 2022-11-29 | Payer: MEDICARE

## 2022-11-28 MED ADMIN — sodium chloride (NS) 0.9 % infusion: INTRAVENOUS | @ 15:00:00 | Stop: 2022-11-28

## 2022-11-28 MED ADMIN — fentaNYL (PF) (SUBLIMAZE) injection: INTRAVENOUS | @ 15:00:00 | Stop: 2022-11-28

## 2022-11-28 MED ADMIN — ceFAZolin (ANCEF) injection: INTRAVENOUS | @ 15:00:00 | Stop: 2022-11-28

## 2022-11-28 MED ADMIN — lidocaine (XYLOCAINE) 10 mg/mL (1 %) injection: INTRADERMAL | @ 15:00:00 | Stop: 2022-11-28

## 2022-11-28 MED ADMIN — HYDROmorphone (PF) (DILAUDID) injection 0.5 mg: .5 mg | INTRAVENOUS | @ 15:00:00 | Stop: 2022-12-12

## 2022-11-28 MED ADMIN — midazolam (VERSED) injection: INTRAVENOUS | @ 15:00:00 | Stop: 2022-11-28

## 2022-11-28 MED ADMIN — bacitracin-polymyxin b (POLYSPORIN) ointment: TOPICAL | @ 16:00:00 | Stop: 2022-11-28

## 2022-11-28 NOTE — Unmapped (Signed)
VIR Post-Procedure Note    Procedure Name: Kyphoplasty L4    Pre-Op Diagnosis & indication: Compression fracture L4    Post-Op Diagnosis: Same as pre-operative diagnosis    VIR Attending: Everett Graff, MD, MD    VIR Fellow/Resident/PA/NP: None    Time out: Prior to the procedure, a time out was performed with all team members present. During the time out, the patient, procedure and procedure site when applicable were verbally verified by the team members and Dr. Everett Graff, MD.    Description of procedure: Successful L4 kyphoplasty with no complication. Small volume cement leakage into the superior and inferior disc spaces due to fracture configuration    Plan: Bedrest x2 hrs and Return to clinic in 3 weeks with x-ray    Sedation:Moderate sedation    Estimated Blood Loss: Minimal  Complications: None    See detailed procedure note with images in PACS [facility]).    The patient tolerated the procedure well without incident or complication.    Interventional Radiologist: Everett Graff, MD, MD  Date/Time: 11/28/2022 11:45 AM

## 2022-11-28 NOTE — Unmapped (Signed)
Patient tolerated procedure well, completed 2 hour flat bed rest. 2 incision sites clean, dry, and intact. Discharge instructions reviewed with pt and and family member. PIV removed. Patient ambulated out facility without assistance.

## 2022-12-01 NOTE — Unmapped (Signed)
[facility] Refill Coordination Note    Specialty Medication(s) to be Shipped:   Hematology/Oncology: abiraterone 250mg     Other medication(s) to be shipped: No additional medications requested for fill at this time     Gedeon Stamp, DOB: 08/19/1945  Phone: [phone] (home)       All above HIPAA information was verified with patient.     Was a Nurse, learning disability used for this call? No    Completed refill call assessment today to schedule patient's medication shipment from the [facility] [phone]).  All relevant notes have been reviewed.     Specialty medication(s) and dose(s) confirmed: Regimen is correct and unchanged.   Changes to medications: Fawwaz reports no changes at this time.  Changes to insurance: No  New side effects reported not previously addressed with a pharmacist or physician: None reported  Questions for the pharmacist: No    Confirmed patient received a Conservation officer, historic buildings and a Surveyor, mining with first shipment. The patient will receive a drug information handout for each medication shipped and additional FDA Medication Guides as required.       DISEASE/MEDICATION-SPECIFIC INFORMATION        N/A    SPECIALTY MEDICATION ADHERENCE     Medication Adherence    Patient reported X missed doses in the last month: 0  Specialty Medication: Abiraterone 250mg   Patient is on additional specialty medications: No  Informant: patient     Were doses missed due to medication being on hold? No    Abiraterone 250 mg: 14 days of medicine on hand       REFERRAL TO PHARMACIST     Referral to the pharmacist: Not needed      [facility]     Shipping address confirmed in Epic.     Delivery Scheduled: Yes, Expected medication delivery date: 12/09/22.     Medication will be delivered via Next Day Courier to the prescription address in Epic [place].    [name] M [name]   [facility] Specialty Technician

## 2022-12-05 NOTE — Unmapped (Signed)
Patient states in the past two days he has been experiencing pain again, but lower to site of surgery.Encouraged him to reach out to his physician. No other complaints post procedure. All other questions answered. Callback number given should they arise.

## 2022-12-08 MED FILL — ABIRATERONE 250 MG TABLET: ORAL | 30 days supply | Qty: 90 | Fill #10

## 2022-12-10 DIAGNOSIS — C61 Malignant neoplasm of prostate: Principal | ICD-10-CM

## 2022-12-14 ENCOUNTER — Ambulatory Visit
Admit: 2022-12-14 | Discharge: 2022-12-14 | Payer: MEDICARE | Attending: Diagnostic Radiology | Primary: Diagnostic Radiology

## 2022-12-14 ENCOUNTER — Ambulatory Visit
Admit: 2022-12-14 | Discharge: 2022-12-14 | Payer: MEDICARE | Attending: Hematology & Oncology | Primary: Hematology & Oncology

## 2022-12-14 ENCOUNTER — Ambulatory Visit: Admit: 2022-12-14 | Discharge: 2022-12-14 | Payer: MEDICARE

## 2022-12-14 ENCOUNTER — Institutional Professional Consult (permissible substitution): Admit: 2022-12-14 | Discharge: 2022-12-14 | Payer: MEDICARE

## 2022-12-14 ENCOUNTER — Ambulatory Visit
Admit: 2022-12-14 | Discharge: 2022-12-15 | Payer: MEDICARE | Attending: Student in an Organized Health Care Education/Training Program | Primary: Student in an Organized Health Care Education/Training Program

## 2022-12-14 ENCOUNTER — Other Ambulatory Visit: Admit: 2022-12-14 | Discharge: 2022-12-14 | Payer: MEDICARE

## 2022-12-14 DIAGNOSIS — S32040A Wedge compression fracture of fourth lumbar vertebra, initial encounter for closed fracture: Principal | ICD-10-CM

## 2022-12-14 DIAGNOSIS — C61 Malignant neoplasm of prostate: Principal | ICD-10-CM

## 2022-12-14 LAB — HEPATIC FUNCTION PANEL
ALBUMIN: 3.9 g/dL (ref 3.4–5.0)
ALKALINE PHOSPHATASE: 89 U/L (ref 46–116)
ALT (SGPT): 19 U/L (ref 10–49)
AST (SGOT): 27 U/L (ref <=34)
BILIRUBIN DIRECT: 0.3 mg/dL (ref 0.00–0.30)
BILIRUBIN TOTAL: 1 mg/dL (ref 0.3–1.2)
PROTEIN TOTAL: 6.5 g/dL (ref 5.7–8.2)

## 2022-12-14 LAB — POTASSIUM: POTASSIUM: 4.6 mmol/L (ref 3.5–5.1)

## 2022-12-14 LAB — PSA: PROSTATE SPECIFIC ANTIGEN: <0.04 ng/mL (ref 0.00–4.00)

## 2022-12-14 MED ADMIN — leuprolide acetate (6 month) (ELIGARD) injection 45 mg: 45 mg | SUBCUTANEOUS | @ 18:00:00 | Stop: 2022-12-14

## 2022-12-14 NOTE — Unmapped (Unsigned)
[facility] Visit Note    Patient Name: Eric Good  Patient Age: 77 y.o.  Encounter Date: 12/14/2022  Attending Provider:  Young E. 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    Eric Good is a 58 y.o. man with newly diagnosed de novo metastatic prostate cancer, T2N1M1a, involving pelvic and retroperitoneal lymph nodes on conventional CT and bone scans. PSA being 45 and Gleason 4+5=9 disease also support this diagnosis.    I discussed with the patient the long term prognosis of prostate cancer and treatment options, such as androgen deprivation therapy, and benefit and side effects of ADT.  Side effects includes but are not limited to loss of libido, erectile dysfunction, hot flashes, fatigue, weight gain, metabolic syndrome that increases the risk of diabetes and cardiovascular events, osteoporosis, and reduced muscle mass.  I have counseled the patient on the need to take calcium and vitamin D as well as regular weight bearing exercise to minimize the side effects of ADT. It is expected that virtually all patients will develop progressive disease while on ADT and at that time additional treatment would be required, but ultimately, the patient may pass away from prostate cancer.    A series of recent studies demonstrate the benefit of additional therapy (docetaxel per CHAARTED and STAMPEDE, abiraterone/prednisone per LATITUTUDE, enzalutamide per ARCHES and ENZAMET, apalutamide per TITAN).  Therefore, the current option would be to add one of AR targeted agents. Docetaxel chemo would not be recommended for low volume patients.  I discussed the consideration of additional therapy and the benefit and side effects of an agent such as abiraterone.    On 06/16/21, patient returns after PSMA-PET, which showed uptake in the prostate, multiple retrocaval and left iliac lymph nodes, and the left scapular body, concerning for metastatic disease. We discussed the implications of these findings and re-discussed the risks and benefits of ADT as well as the possible benefit from the addition of an AR-targeted therapy. Patient elected today to start treatment with ADT.    In 07/2021, PSA down on ADT. After discussion of benefit/side effects, abiraterone and prednisone prescribed. Will get somatic tumor genomic testing. Also referral to radiation oncology.    In 08/2021, PSA is 1.33. His LFT's are okay. I recommend obtaining germline testing at his next visit. I also spoke to him about joining the [employer] study. Pt was agreeable to both.    In 10/2021, PSA is 0.39. His LFT's are slightly elevated.  We will continue to monitor LFT's every 2 weeks and hope to see improvement/normalization.  Otherwise tolerating ADT, abi, and pred well. I'll see him back in mid-June for his next dose of Eligard. He was also noted to have bradycardia on vitals.  EKG today without bradycardia but noted ventricular bigeminy/frequent PVCs.  He is asymptomatic today but did have an episode of chest pain for 20 minutes 4 nights ago.  We will have him see cardiology again.     In  01/2022, doing well. Nothing remarkable on somatic/germline testing. PSA, labs great. Continue with current treatment of ADT plus abiraterone.    On 06/13/2022, PSA is undetectable today. DEXA results show T -1.3.  MRI pelvis to r/o prostate invading into rectum.    On 09/21/2022, PSA is undetectable. Prostate MRI on 07/04/2022 showed restricted diffusion at the right base of the prostate, suspicious for locally recurrent prostate carcinoma. No evidence of rectal invasion. MRI of L spine ordered for back pain and to follow up on L4 compression fracture. Continue current therapy.     Today on 12/14/2022, PSA is undetectable. Proceed with Eligard injection today. Continue current therapy with abiraterone. Consider addition of a bone strengthening agent, restage DEXA prior to next appointment.     Plan  Continue ADT with Eligard 45 mg subcutaneous, given last on 12/14/2022, due next on or after 06/15/2023   Abiraterone 1000 mg every day and prednisone 5 mg daily. Follow LFT's, K, BP.  Pt will have completed 2 years of therapy in 07/2023.  Completed IMRT to the prostate and pelvic nodes and SRBT to the left scapula and prostate on 09/05/2021.   Somatic tumor mutation profiling done, no targetable mutations  Germline testing negative  Resolved back pain, status post kyphoplasty to L4 compression fracture  Repeat DEXA scan in 3 months  Consider Reclast pending results   Return in 3 months for follow up with labs    I personally spent 40 minutes face-to-face and non-face-to-face in the care of this patient, which includes all pre, intra, and post visit time on the date of service.  All documented time was specific to the E/M visit and does not include any procedures that may have been performed.      Reason for Visit  Follow up of prostate cancer    History of Present Illness:  Hematology/Oncology History Overview Note   In 04/2021, screening PSA of 49, then 45.3. DRE abnormal, firm at L base  In 05/2021, prostate biopsy showed Gleason 4+5=9, 6/12 cores involved. CT showed retroperitoneal and pelvic adenopathy. Bone scan showed single focus in L scapula, nonspecific.  In 06/2021, PSMA PET showed retroperitoneal/pelvic node mets, L scapular met.  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)   06/16/2021 Initial Diagnosis    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 Back pain resolved s/p Kyphoplasty. Receiving ultrasound of heart. No other complaints noted.       Allergies:  Allergies   Allergen Reactions    Flonase [Fluticasone]      Nose bleed    Budesonide Nausea Only     Other reaction(s): Other (See Comments)    Epistaxis       Current Medications:    Current Outpatient Medications:     abiraterone (ZYTIGA) 250 mg tablet, Take 3 tablets (750 mg total) by mouth daily., Disp: 90 tablet, Rfl: 11    ascorbic acid, vitamin C, (ASCORBIC ACID) 250 mg Chew, Chew 2 tablets (500 mg total)  in the morning., Disp: , Rfl:     calcium carbonate 300 mg (750 mg) Chew, , Disp: , Rfl:     carboxymethyl-gly-poly80-PF (REFRESH OPTIVE MEGA-3, PF,) 0.5-1-0.5 % Dpet, Apply 3 drops to eye two (2) times a day. Three drops on left eye and two drops on the right eye, Disp: , Rfl:     cholecalciferol, vitamin D3-125 mcg, 5,000 unit,, 125 mcg (5,000 unit) tablet, Take 1 tablet (125 mcg total) by mouth two (2) times a day., Disp: , Rfl:     esomeprazole (NEXIUM) 20 MG capsule, Take 1 capsule (20 mg total) by mouth in the morning., Disp: , Rfl:     fish oil-omega-3 fatty acids 300-1,000 mg capsule, Take 2 capsules (2 g total) by mouth daily., Disp: , Rfl:     leuprolide acetate, 6 month, (ELIGARD) 45 mg injection, Inject 45 mg under the skin every six (6) months., Disp: , Rfl:     loperamide HCl (IMODIUM A-D ORAL), , Disp: , Rfl:     melatonin 3 mg Tab, Take 1 tablet (3 mg total) by mouth nightly as needed., Disp: , Rfl:     methocarbamol (ROBAXIN) 500 MG tablet, TAKE 1 TABLET BY MOUTH THREE TIMES DAILY FOR 7 DAYS AS NEEDED, Disp: , Rfl:     multivitamin (TAB-A-VITE/THERAGRAN) per tablet, Take 1 tablet by mouth daily., Disp: , Rfl:     olopatadine (PATADAY ONCE DAILY RELIEF) 0.7 % ophthalmic solution, Administer 1 drop to both eyes daily., Disp: , Rfl:     predniSONE (DELTASONE) 5 MG tablet, Take 1 tablet (5 mg total) by mouth daily., Disp: 30 tablet, Rfl: 11    propylene glycol (SYSTANE COMPLETE) 0.6 % Drop, Apply 3 drops to eye once. Three drops on the left and two drops on the right, Disp: , Rfl:     vitamin B complex vit C no.3 (VITAMIN B COMP AND C NO.3 ORAL), Take by mouth daily as needed. , Disp: , Rfl:     vitamin D3-folic acid 125 mcg (5,000 unit)-1 mg Tab, Take 125 mcg by mouth in the morning. (Patient not taking: Reported on 11/02/2022), Disp: , Rfl:     zinc gluconate 50 mg (7 mg elemental zinc) tablet, Take 1 tablet (50 mg total) by mouth daily., Disp: , Rfl:     Past Medical History and Social History  Past Medical History:   Diagnosis Date    Rapid heart beat     Negative cardiac workup in 2020      Past Surgical History:   Procedure Laterality Date    IR PERC AUGMENT LUMBAR 1 VERT BODY  11/28/2022    IR PERC AUGMENT LUMBAR 1 VERT BODY 11/28/2022 Myrtie Hawk, MD IMG IR NELS [id number]        Social History  Occupational History    Not on file   Tobacco Use    Smoking status: Former     Types: Cigars Start date: 12/11/2016    Smokeless tobacco: Never   Substance and Sexual Activity    Alcohol use: Not Currently    Drug use: Not on file    Sexual activity: Not on file       Family History    Prostate Cancer Family History Assessment:  History of cancer in children (yes/no; if yes, what type AND age of diagnosis): No  History of cancer in siblings (yes/no; if yes, provide relation, type of cancer, AND age of diagnosis): No  History of cancer in parents (yes/no; if yes, please specify parent, type of cancer, AND age of diagnosis): Yes, sister with stomach and eye cancer, age 3  History of cancer in aunts/uncles/grandparents (yes/no; if yes, provide relation, type of cancer, AND age of diagnosis): No      Review of Systems:  A comprehensive review of 10 systems was negative except for pertinent positives noted in HPI.    Physical Examination:    VITAL SIGNS:  There were no vitals taken for this visit.  ECOG Performance Status: 1  GENERAL: Well-developed, well-nourished patient in no acute distress.  HEAD: Normocephalic and atraumatic.  EYES: Conjunctivae are normal. No scleral icterus.  MOUTH/THROAT: Oropharynx is clear and moist.  No mucosal lesions.  NECK: Supple, no thyromegaly.  LYMPHATICS: No palpable cervical, supraclavicular, or axillary adenopathy.  CARDIOVASCULAR: Normal rate, regular rhythm and normal heart sounds.  Exam reveals no gallop and no friction rub.  No murmur heard.  PULMONARY/CHEST: Effort normal and breath sounds normal. No respiratory distress.  GASTROINTESTINAL/ABDOMINAL:  Soft. There is no distension. There is no tenderness. There is no rebound and no guarding.  MUSCULOSKELETAL: No clubbing, cyanosis, or lower extremity edema.  PSYCHIATRIC: Alert and oriented.  Normal mood and affect.  NEUROLOGIC: No focal motor deficit. Normal gait.  SKIN: Skin is warm, dry, and intact.       Results/Orders:      No visits with results within 2 Day(s) from this visit.   Latest known visit with results is:   Appointment on 09/21/2022   Component Date Value Ref Range Status    Potassium 09/21/2022 4.6  3.5 - 5.1 mmol/L Final    PSA 09/21/2022 <0.04  0.00 - 4.00 ng/mL Final    Albumin 09/21/2022 3.8  3.4 - 5.0 g/dL Final    Total Protein 09/21/2022 6.2  5.7 - 8.2 g/dL Final    Total Bilirubin 09/21/2022 0.5  0.3 - 1.2 mg/dL Final    Bilirubin, Direct 09/21/2022 0.20  0.00 - 0.30 mg/dL Final    AST 16/04/9603 22  <=34 U/L Final    ALT 09/21/2022 17  10 - 49 U/L Final    Alkaline Phosphatase 09/21/2022 78  46 - 116 U/L Final     Lab Results   Component Value Date    PSA <0.04 09/21/2022    PSA <0.04 06/13/2022    PSA 0.05 04/24/2022    PSA 0.06 04/03/2022    PSA <0.04 02/27/2022    PSA 0.04 01/31/2022         Orders placed or performed in visit on 12/10/22    Therapeutic Injection Appointment Request           Molecular Pathology  Tumor mutation profiling on 08/01/18  TMB 0 m/Mb  MSS  APC  MTA  PTEN  CDKN2A/B  CIC  TP53    Germline testing [facility] on 09/12/21  Negative    Pathology  05/04/2021  [A] PROSTATE, LEFT BASE:   ACINAR ADENOCARCINOMA, GLEASON 4+3=7  (GG   3), INVOLVING 1 OF 1 CORES, MEASURING 5  MM ( 71%). 80% PATTERN 4;   CRIBRIFORM PATTERN 4 PRESENT    [B] PROSTATE, LEFT MID:   ACINAR ADENOCARCINOMA, GLEASON 4+3=7  (GG 3),   INVOLVING 1 OF 1 CORES, MEASURING 5  MM ( 83%). 80% PATTERN 4;   CRIBRIFORM PATTERN 4 PRESENT    [C] PROSTATE, LEFT APEX:   ACINAR ADENOCARCINOMA, GLEASON 4+3=7  (GG   3), INVOLVING 1 OF 2 CORES, MEASURING 4  MM ( 40%). 70% PATTERN 4;   CRIBRIFORM PATTERN 4 PRESENT    [D] PROSTATE, RIGHT BASE:   NEGATIVE FOR MALIGNANCY.    [E] PROSTATE, RIGHT MID:   NEGATIVE FOR MALIGNANCY.    [F] PROSTATE, RIGHT APEX:   NEGATIVE FOR MALIGNANCY.    [G] PROSTATE, LEFT LATERAL BASE:   ACINAR ADENOCARCINOMA, GLEASON 4+4=8   (GG 4), INVOLVING 1 OF 1 CORES, MEASURING 5  MM ( 50%). PERINEURAL   INVASION PRESENT    [H] PROSTATE, LEFT LATERAL MID:   ACINAR ADENOCARCINOMA, GLEASON 4+5=9   (GG 5), INVOLVING 1 OF 1 CORES, MEASURING 9  MM ( 100%). CRIBRIFORM   PATTERN 4 PRESENT    [I] PROSTATE, LEFT LATERAL APEX:   ACINAR ADENOCARCINOMA, GLEASON 4+3=7   (GG 3), INVOLVING 2 OF 2 CORES, MEASURING 8  MM ( 100%). 80% PATTERN 4;   CRIBRIFORM PATTERN 4 PRESENT; PERINEURAL INVASION PRESENT    [J] PROSTATE, RIGHT LATERAL BASE:   NEGATIVE FOR MALIGNANCY.    [K] PROSTATE, RIGHT LATERAL MID:   NEGATIVE FOR MALIGNANCY.    [L] PROSTATE, RIGHT LATERAL APEX:   NEGATIVE FOR MALIGNANCY.     Imaging results:  CT AP 05/19/2021  LYMPH NODES: Retroperitoneal and pelvic adenopathy with index lesions as follows  --1.5 cm retrocaval lymph node  -- 1.4 cm left common iliac lymph node.  --1.9 cm left external iliac lymph node  --1.8 cm left internal iliac lymph  node  IMPRESSION:  Retroperitoneal and  left pelvic adenopathy, suspicious for metastatic disease.     Heterogeneous appearance of the prostate, consistent with known neoplasm.     Indeterminate lesion in the right posterior 12th rib. Recommend correlation with concurrent bone scan.     Mildly hyperenhancing right renal lesion, suspicious for cortical neoplasm. Recommend correlation with prior imaging if available. If not, MRI of the abdomen with and without contrast may be performed for definitive characterization.    Bone scan 05/19/2021  Focal area of increased radiotracer uptake within the left scapula which could represent osseous metastasis or remote trauma, indeterminate      PET CT Skull Base to Thigh 06/13/2021  -Mild radiotracer uptake within the prostate, likely corresponding to patient's known neoplasm.   -Multiple radiotracer avid retrocaval and left iliac lymph nodes, as described above, concerning for metastatic disease. Tiny right iliac/pelvic sidewall lymph nodes with no significant uptake, indeterminate.   -Radiotracer uptake within the left scapular body, corresponding to increased tracer uptake on recent bone scan, concerning for osseous metastatic disease.    MRI Upper Extremity Non-Joint Left W Wo Contrast  Result Date: 05/02/2022   Impression: 1.Enhancing lytic lesion seen within the scapula compatible with known metastasis which appears overall similar to prior CT allowing for differences in modality. 2.Surrounding intramuscular edema and enhancement and areas of  possible muscle necrosis. Findings are favored to represent posttreatment changes in setting of recent radiation therapy. 3.Glenohumeral degenerative changes with small joint effusion and degenerative labral tearing/chondrosis.      DEXA bone scan  FINDINGS  Lumbar Spine   Excluded levels: L4  BMD: 0.963 (g/cm)   T score: -1.0  Lumbar WHO classification: NORMAL BONE MINERAL DENSITY.     Left Hip   Femoral Neck:   BMD: 0.747 (g/cm)   T score: -1.3  Total Hip   BMD: 1.003 (g/cm)   T score: -0.2  Hip WHO classification: LOW BONE MASS.     MRI Prostate W Wo Contrast (07/04/2022)  Impression: Restricted diffusion identified at the right base of the prostate at the region of the proximal right seminal vesicle. Findings are suspicious for locally recurrent prostate carcinoma. Could consider correlation with biopsy or PSMA PET. DynaCAD imaging sent to PACS PI-RADS v2.1 Assessment Categories PI-RADS 1  Very low (clinically significant cancer is highly unlikely to be present) PI-RADS 2  Low (clinically significant cancer is unlikely to be present) PI-RADS 3  Intermediate (the presence of clinically significant cancer is equivocal) PI-RADS 4  High (clinically significant cancer is likely to be present) PI-RADS 5  Very high (clinically significant cancer is highly likely to be present      IR Perc Augment Lumbar 1 Vert Body  Result Date: 11/28/2022   Impression: Vertebral augmentation of the L4 vertebral body Plan: Return to clinic in 3 weeks with L-spine x-ray     MRI Lumbar Spine W Wo Contrast  Result Date: 10/06/2022   Impression: Compression fracture of L4 as described above. Enhancement diffusely involves the vertebral body and there is no evidence of epidural extension/curtain sign to suggest underlying pathologic lesion. No definite metastatic lesions throughout the visualized portions of the spine. Degenerative changes, worst at L5-S1. Mild congenital canal narrowing worsened by facet degenerative changes as well as mild bilateral foraminal narrowing at L4-5.       I attest that I, Shearon Stalls, personally documented this note while acting as scribe for Maurie Boettcher, MD       Shearon Stalls, Scribe.  12/14/2022       Documentation assistance provided by Medical Scribe, Shearon Stalls, who was present during the entirety of the visit. I reviewed the note below and validated all of the information provided to ensure accuracy and completeness.      Maurie Boettcher, MD

## 2022-12-14 NOTE — Unmapped (Signed)
Patient in clinic today for F/U with  VIR provider MD/APP following: Kypho L4    Procedure completed in [facility]IR 11/28/22 .   VSS. Alert and Oriented x4. Denies pain on initial assessment/exam by RN. Allergies and home medications reviewed and documented. RN provided education to patient. No further questions or concerns at this time. RN offered clinic number for patient contact.

## 2022-12-14 NOTE — Unmapped (Addendum)
It was a pleasure to see you in the clinic today.    Your PSA is undetectable today. I would like you to receive Eligard today and continue to take abiraterone. We will discuss the continuation of your treatment in December.     I am concerned that there is osteoporosis that the DEXA scan is not detecting. I recommend considering a bone strengthening agent, likely Reclast. Side effects include damaging the jaw bone, primarily caused by dental extraction. I will schedule another DEXA prior to your next appointment.     Please return in 3 months for follow up.     Lab Results   Component Value Date    PSA <0.04 12/14/2022    PSA <0.04 09/21/2022    PSA <0.04 06/13/2022    PSA 0.05 04/24/2022    PSA 0.06 04/03/2022    PSA <0.04 02/27/2022     Please call [phone] to reach my nurse navigator Konrad Penta for any issues.    For emergencies on Nights, Weekends and Holidays  Call [phone] for help.      Griffin Basil, MD, PhD  Associate Professor of Medicine  Division of Oncology    [facility]  Genitour[facility]  Nurse Navigator: Konrad Penta  Fax: [phone]

## 2022-12-14 NOTE — Unmapped (Unsigned)
12/14/2022      Subjective/Assessment/Recommendations: Pt in radiation oncology clinic today for follow up regarding radiation treatment to the prostate. Pt finished treatment on 10/27/21.       1. Fatigue: Denies  2. Pain: Denies  3. Elimination: Pt reports occasional bowel urgency  4. Prescription Needs: none  5. Psychosocial: has home support    Wt Readings from Last 6 Encounters:   12/14/22 87.5 kg (193 lb)   12/14/22 88.2 kg (194 lb 8 oz)   11/02/22 88.5 kg (195 lb)   09/21/22 91.4 kg (201 lb 8 oz)   06/13/22 89.9 kg (198 lb 3.2 oz)   04/13/22 87.8 kg (193 lb 9.6 oz)

## 2022-12-14 NOTE — Unmapped (Deleted)
[facility]OLOG[facility] - FOLLOW UP VISIT    Patient Name: Eric Good  Patient Age: 77 y.o.  Encounter Date: 12/14/2022  Attending Interventional Radiologist: Dr. Benjamine Mola  Resident Interventional Radiologist: Dr. Leone Payor    REFERRING PHYSICIAN: Gracelyn Nurse, MD  [address] [facility]  [facility]/Int Med  [place],  [place] [address]  PRIMARY CARE PROVIDER: Gracelyn Nurse, MD      Subjective:     Chief Complaint: Follow-up visit    History of Present Illness:  Eric Good is a 77 y.o. male who returns to clinic today. He underwent L4 Kyphoplasty for a vertebral compression fracture on 11/28/22.     Today he reports***.    Review of Systems: Pertinent positives and negatives are noted in the HPI above. Review of systems otherwise negative.    Past Medical/Surgical History:  Past Medical History:   Diagnosis Date    Rapid heart beat     Negative cardiac workup in 2020       Past Surgical History:   Procedure Laterality Date    IR PERC AUGMENT LUMBAR 1 VERT BODY  11/28/2022    IR PERC AUGMENT LUMBAR 1 VERT BODY 11/28/2022 Myrtie Hawk, MD IMG IR NELS 2214       Family History:  Patient family history is not on file.    Social History:    Social History     Socioeconomic History    Marital status: Widowed   Tobacco Use    Smoking status: Former     Types: Cigars     Start date: 12/11/2016    Smokeless tobacco: Never   Substance and Sexual Activity    Alcohol use: Not Currently       Allergies:  Allergies   Allergen Reactions    Flonase [Fluticasone]      Nose bleed    Budesonide Nausea Only     Other reaction(s): Other (See Comments)    Epistaxis       Medications:    Current Outpatient Medications:     abiraterone (ZYTIGA) 250 mg tablet, Take 3 tablets (750 mg total) by mouth daily., Disp: 90 tablet, Rfl: 11    ascorbic acid, vitamin C, (ASCORBIC ACID) 250 mg Chew, Chew 2 tablets (500 mg total)  in the morning., Disp: , Rfl:     calcium carbonate 300 mg (750 mg) Chew, , Disp: , Rfl:     carboxymethyl-gly-poly80-PF (REFRESH OPTIVE MEGA-3, PF,) 0.5-1-0.5 % Dpet, Apply 3 drops to eye two (2) times a day. Three drops on left eye and two drops on the right eye, Disp: , Rfl:     cholecalciferol, vitamin D3-125 mcg, 5,000 unit,, 125 mcg (5,000 unit) tablet, Take 1 tablet (125 mcg total) by mouth two (2) times a day., Disp: , Rfl:     esomeprazole (NEXIUM) 20 MG capsule, Take 1 capsule (20 mg total) by mouth in the morning., Disp: , Rfl:     fish oil-omega-3 fatty acids 300-1,000 mg capsule, Take 2 capsules (2 g total) by mouth daily., Disp: , Rfl:     leuprolide acetate, 6 month, (ELIGARD) 45 mg injection, Inject 45 mg under the skin every six (6) months., Disp: , Rfl:     loperamide HCl (IMODIUM A-D ORAL), , Disp: , Rfl:     melatonin 3 mg Tab, Take 1 tablet (3 mg total) by mouth nightly as needed., Disp: , Rfl:     methocarbamol (ROBAXIN)  500 MG tablet, TAKE 1 TABLET BY MOUTH THREE TIMES DAILY FOR 7 DAYS AS NEEDED, Disp: , Rfl:     multivitamin (TAB-A-VITE/THERAGRAN) per tablet, Take 1 tablet by mouth daily., Disp: , Rfl:     olopatadine (PATADAY ONCE DAILY RELIEF) 0.7 % ophthalmic solution, Administer 1 drop to both eyes daily., Disp: , Rfl:     predniSONE (DELTASONE) 5 MG tablet, Take 1 tablet (5 mg total) by mouth daily., Disp: 30 tablet, Rfl: 11    propylene glycol (SYSTANE COMPLETE) 0.6 % Drop, Apply 3 drops to eye once. Three drops on the left and two drops on the right, Disp: , Rfl:     vitamin B complex vit C no.3 (VITAMIN B COMP AND C NO.3 ORAL), Take by mouth daily as needed. , Disp: , Rfl:     vitamin D3-folic acid 125 mcg (5,000 unit)-1 mg Tab, Take 125 mcg by mouth in the morning. (Patient not taking: Reported on 11/02/2022), Disp: , Rfl:     zinc gluconate 50 mg (7 mg elemental zinc) tablet, Take 1 tablet (50 mg total) by mouth daily., Disp: , Rfl:      Objective:     Physical Exam    Vital Signs:   There were no vitals filed for this visit.  There is no height or weight on file to calculate BMI.  General: Well developed, well nourished male in no acute distress.  HEENT: Normocephalic, atraumatic, anicteric sclera. Airway assessment: {housemallampati:28309}  Cardiovascular: Regular rate.  Pulmonary: Normal work of breathing on room air.  Abd: Soft, non-tender, non-distended.  Musculoskeletal: Extremities warm and well perfused. No cyanosis, clubbing, or edema.  Skin: Normal skin turgor. No rashes.  Neurologic: Alert and oriented x 3. No obvious focal deficits.  Psych: Appropriate affect.    Pertinent Laboratory Values:   Lab Results   Component Value Date    WBC 6.7 06/16/2021    HGB 15.6 06/16/2021    HCT 46.4 06/16/2021    PLT 193 06/16/2021       Lab Results   Component Value Date    NA 139 06/16/2021    K 4.6 09/21/2022    CL 103 06/16/2021    CO2 28.0 06/16/2021    BUN 22 06/16/2021    CREATININE 1.1 08/22/2021    GLU 89 06/16/2021    CALCIUM 10.2 06/16/2021    MG 1.9 12/12/2018    PHOS 3.2 12/12/2018       Lab Results   Component Value Date    BILITOT 0.5 09/21/2022    BILIDIR 0.20 09/21/2022    PROT 6.2 09/21/2022    ALBUMIN 3.8 09/21/2022    ALT 17 09/21/2022    AST 22 09/21/2022    ALKPHOS 78 09/21/2022       No results found for: LABPROT, INR, APTT    ASA Score: {asa grade vir:23729}    Performance Status: {ecogps:28295}    Imaging Studies: Lumbar Spine Radiographs 12/14/22:    Assessment:   Eric Good is a 77 y.o. male who presents for follow-up of L4 Kyphoplasty on 11/28/22.    Plan (Medical Decision Making):   I discussed the various treatment options available. At this time, we feel that she would benefit most from {VIRprocedures:98359}. The risks, benefits and alternatives were fully discussed including bleeding, infection, {RISK:104117}, ***, and damage to adjacent structures/organs. The patient's questions were all answered to her satisfaction.     -- Procedure will be scheduled at the earliest mutually  available date & time  -- Anticoagulant medication hold required: {VIRanticoagmeds:99051}  -- Anticoagulation can be resumed: {Resume Anticoagulation Timeline:101607}  -- Antibiotics required: {.YES/NO:103532}  -- Discharge medications:{Yes2:103810} {.discharge medication:103531}  -- Planned level of sedation: {IRlevelsedation2023:98360}  -- Planned access: {right/left/bilateral:56652:x} {IRACCESSSITE:83441}, Positioning: {IRPOSITIONING:83503}  -- Labs needed: {.yes2:103563}  -- Pre-procedural medications required: {Yes2:103810} {.premedication:103536}  -- Pre-US needed: {ZOX09:604540}    Informed consent obtained:   {.no:103537}    {VIR airway assessment:23727}      Patient seen and discussed with Dr. Benjamine Mola who is in agreement with the above assessment and plan.     Odelia Gage, MD  [facility], PGY-5

## 2022-12-14 NOTE — Unmapped (Signed)
Patient received Eligard 45mg  SQ in Injection site: left lower abdomen. Band aid and guaze applied. Patient tolerated injection without complications and ambulated to checkout without any assistance

## 2022-12-14 NOTE — Unmapped (Signed)
Patient Name: Eric Good  Patient Age: 77 y.o.  Encounter Date: 12/14/22       Referring Physician: Gracelyn Nurse, MD  [address]  [facility]  [place],  [place] [address]  Primary Care Provider: Gracelyn Nurse, MD      SUBJECTIVE      Reason for Visit: Follow up    History of Present Illness: Eric Good is a 77 y.o. male with an L4 vertebral compression fracture after lifting a trailer in Feb who presents for follow up s/p L4 kyphoplasty on 11/28/22. He is doing very well today with no residual back pain. His pain previously could get up to 10/10. He denies lower extremity sciatica symptoms. We reviewed his imaging today in clinic. He is very happy with the results and plans to go back to the gym now.     Review of Systems: Pertinent positives and negatives are noted in the HPI above. Review of systems otherwise negative.    Past Medical History:  Past Medical History:   Diagnosis Date    Rapid heart beat     Negative cardiac workup in 2020       Past Surgical History:  Past Surgical History:   Procedure Laterality Date    IR PERC AUGMENT LUMBAR 1 VERT BODY  11/28/2022    IR PERC AUGMENT LUMBAR 1 VERT BODY 11/28/2022 Myrtie Hawk, MD IMG IR NELS 2214       Family History:  No family history on file.     Allergies:  Allergies   Allergen Reactions    Flonase [Fluticasone]      Nose bleed    Budesonide Nausea Only     Other reaction(s): Other (See Comments)    Epistaxis        Anticoagulant/Antiplatelet Medications: No anticoagulant medications    OBJECTIVE   BP 127/73  - Pulse 66  - Temp 36.9 ??C (98.5 ??F) (Temporal)  - Resp 16  - SpO2 99%     Physical Exam:  General: alert, in no acute distress  Respiratory: breathing comfortably on room air  Cardiovascular: regular rate  MSK:  no gross abnormalities, nontender at L4  Skin: well healed incisions  Neurologic: grossly normal, alert & oriented x3    Point-of-care US performed: Not performed    Pertinent Laboratory Values:  Lab Results   Component Value Date    WBC 6.7 06/16/2021    HGB 15.6 06/16/2021    HCT 46.4 06/16/2021    PLT 193 06/16/2021     Lab Results   Component Value Date    NA 139 06/16/2021    K 4.6 09/21/2022    PHOS 3.2 12/12/2018    MG 1.9 12/12/2018     Lab Results   Component Value Date    BUN 22 06/16/2021         Pertinent Imaging Studies: On 12/14/2022, imaging was visualized and reviewed by me personally. Interpretation: post-op X-ray with no complicating features; based on imaging recommend no further imaging.    ASSESSMENT/PLAN     Eric Good is a 77 y.o. male s/p L4 kyphoplasty for fracture.    The patient's questions were all answered to his satisfaction.   --Not interested in physical therapy at this time. Will go back to the gym. We discussed graduated weights.  --Continue Vit D and Calcium  --Follow up with VIR only as needed  Everett Graff, MD  12/14/22 10:03 AM

## 2022-12-28 NOTE — Unmapped (Addendum)
[facility] Clinical Assessment & Refill Coordination Note    Eric Good, DOB: Apr 12, 1946  Phone: [phone] (home)     All above HIPAA information was verified with patient.     Was a Nurse, learning disability used for this call? No    Specialty Medication(s):   Hematology/Oncology: abiraterone 750 mg     Current Outpatient Medications   Medication Sig Dispense Refill    abiraterone (ZYTIGA) 250 mg tablet Take 3 tablets (750 mg total) by mouth daily. 90 tablet 11    ascorbic acid, vitamin C, (ASCORBIC ACID) 250 mg Chew Chew 2 tablets (500 mg total)  in the morning.      calcium carbonate 300 mg (750 mg) Chew       carboxymethyl-gly-poly80-PF (REFRESH OPTIVE MEGA-3, PF,) 0.5-1-0.5 % Dpet Apply 3 drops to eye two (2) times a day. Three drops on left eye and two drops on the right eye      cholecalciferol, vitamin D3-125 mcg, 5,000 unit,, 125 mcg (5,000 unit) tablet Take 1 tablet (125 mcg total) by mouth two (2) times a day.      esomeprazole (NEXIUM) 20 MG capsule Take 1 capsule (20 mg total) by mouth in the morning.      fish oil-omega-3 fatty acids 300-1,000 mg capsule Take 2 capsules (2 g total) by mouth daily.      leuprolide acetate, 6 month, (ELIGARD) 45 mg injection Inject 45 mg under the skin every six (6) months.      multivitamin (TAB-A-VITE/THERAGRAN) per tablet Take 1 tablet by mouth daily.      predniSONE (DELTASONE) 5 MG tablet Take 1 tablet (5 mg total) by mouth daily. 30 tablet 11    propylene glycol (SYSTANE COMPLETE) 0.6 % Drop Apply 3 drops to eye once. Three drops on the left and two drops on the right      vitamin B complex vit C no.3 (VITAMIN B COMP AND C NO.3 ORAL) Take by mouth daily as needed.       vitamin D3-folic acid 125 mcg (5,000 unit)-1 mg Tab Take 125 mcg by mouth in the morning.      zinc gluconate 50 mg (7 mg elemental zinc) tablet Take 1 tablet (50 mg total) by mouth daily.       No current facility-administered medications for this visit.        Changes to medications: [name] reports no changes at this time.    Allergies   Allergen Reactions    Flonase [Fluticasone]      Nose bleed    Budesonide Nausea Only     Other reaction(s): Other (See Comments)    Epistaxis       Changes to allergies: No    SPECIALTY MEDICATION ADHERENCE     abiraterone 750 mg: 15 days of medicine on hand   Medication Adherence    Patient reported X missed doses in the last month: 0  Specialty Medication: abiraterone 750 mg  Informant: patient  Confirmed plan for next specialty medication refill: delivery by pharmacy  Refills needed for supportive medications: not needed        Specialty medication(s) dose(s) confirmed: Regimen is correct and unchanged.     Are there any concerns with adherence? No    Adherence counseling provided? Not needed    CLINICAL MANAGEMENT AND INTERVENTION      Clinical Benefit Assessment:    Do you feel the medicine is effective or helping your condition? Yes  Clinical Benefit counseling provided? Not needed    Adverse Effects Assessment:    Are you experiencing any side effects? No    Are you experiencing difficulty administering your medicine? No    Quality of Life Assessment:    Quality of Life    Rheumatology  Oncology  1. What impact has your specialty medication had on the reduction of your daily pain or discomfort level?: None  2. On a scale of 1-10, how would you rate your ability to manage side effects associated with your specialty medication? (1=no issues, 10 = unable to take medication due to side effects): 2  Dermatology  Cystic Fibrosis          How many days over the past month did your prostate cancer  keep you from your normal activities? For example, brushing your teeth or getting up in the morning. 0    Have you discussed this with your provider? Not needed    Acute Infection Status:    Acute infections noted within Epic:  No active infections  Patient reported infection: None    Therapy Appropriateness:    Is therapy appropriate and patient progressing towards therapeutic goals? Yes, therapy is appropriate and should be continued    DISEASE/MEDICATION-SPECIFIC INFORMATION      N/A    Oncology: Is the patient receiving adequate infection prevention treatment? Not applicable  Does the patient have adequate nutritional support? Not applicable    PATIENT SPECIFIC NEEDS     Does the patient have any physical, cognitive, or cultural barriers? No    Is the patient high risk? Yes, patient is taking oral chemotherapy. Appropriateness of therapy as been assessed    Did the patient require a clinical intervention? No    Does the patient require physician intervention or other additional services (i.e., nutrition, smoking cessation, social work)? No    SOCIAL DETERMINANTS OF HEALTH     At the [facility], we have learned that life circumstances - like trouble affording food, housing, utilities, or transportation can affect the health of many of our patients.   That is why we wanted to ask: are you currently experiencing any life circumstances that are negatively impacting your health and/or quality of life? Patient declined to answer    Social Determinants of Health     Financial Resource Strain: Not on file   Internet Connectivity: Not on file   Food Insecurity: Not on file   Tobacco Use: Medium Risk (12/15/2022)    Patient History     Smoking Tobacco Use: Former     Smokeless Tobacco Use: Never     Passive Exposure: Not on file   Housing/Utilities: Not on file   Alcohol Use: Not At Risk (04/15/2021)    Received from [facility], [facility]    AUDIT-C     Frequency of Alcohol Consumption: 2-4 times a month     Average Number of Drinks: 1 or 2     Frequency of Binge Drinking: Never   Transportation Needs: Not on file   Substance Use: Not on file   Health Literacy: Not on file   Physical Activity: Not on file   Interpersonal Safety: Not on file   Stress: Not on file   Intimate Partner Violence: Not on file   Depression: Not at risk (04/15/2021)    Received from [facility], [facility]    PHQ-2     (OBSOLETE) Total Prescreening Score:  0   Social Connections: Not on file       Would you be willing to receive help with any of the needs that you have identified today? Not applicable       SHIPPING     Specialty Medication(s) to be Shipped:   Hematology/Oncology: abiraterone 750 mg    Other medication(s) to be shipped: No additional medications requested for fill at this time     Changes to insurance: No    Delivery Scheduled: Yes, Expected medication delivery date: 01/11/2023.     Medication will be delivered via Next Day Courier to the confirmed prescription address in [facility].    The patient will receive a drug information handout for each medication shipped and additional FDA Medication Guides as required.  Verified that patient has previously received a Conservation officer, historic buildings and a Surveyor, mining.    The patient or caregiver noted above participated in the development of this care plan and knows that they can request review of or adjustments to the care plan at any time.      All of the patient's questions and concerns have been addressed.    Arlana Lindau, PharmD   [facility] Specialty Pharmacist    I reviewed this patient case and all documentation provided by the learner and was readily available for consultation during their interaction with the patient.  I agree with the assessment and plan listed below.    Kermit Balo, [facility]   [facility] Specialty Pharmacist

## 2023-01-10 MED FILL — ABIRATERONE 250 MG TABLET: ORAL | 30 days supply | Qty: 90 | Fill #11

## 2023-01-28 DIAGNOSIS — C61 Malignant neoplasm of prostate: Principal | ICD-10-CM

## 2023-01-28 MED ORDER — ABIRATERONE 250 MG TABLET
ORAL_TABLET | Freq: Every day | ORAL | 11 refills | 30 days
Start: 2023-01-28 — End: ?

## 2023-01-29 MED ORDER — ABIRATERONE 250 MG TABLET
ORAL_TABLET | Freq: Every day | ORAL | 11 refills | 30 days | Status: CP
Start: 2023-01-29 — End: ?
  Filled 2023-02-05: qty 90, 30d supply, fill #0

## 2023-01-31 NOTE — Unmapped (Signed)
[facility] Refill Coordination Note    Specialty Medication(s) to be Shipped:   Hematology/Oncology: Zytiga 250mg     Other medication(s) to be shipped: No additional medications requested for fill at this time     Eric Good, DOB: Aug 24, 1945  Phone: [phone] (home)       All above HIPAA information was verified with patient.     Was a Nurse, learning disability used for this call? No    Completed refill call assessment today to schedule patient's medication shipment from the [facility] [phone]).  All relevant notes have been reviewed.     Specialty medication(s) and dose(s) confirmed: Regimen is correct and unchanged.   Changes to medications: Durell reports no changes at this time.  Changes to insurance: No  New side effects reported not previously addressed with a pharmacist or physician: None reported  Questions for the pharmacist: No    Confirmed patient received a Conservation officer, historic buildings and a Surveyor, mining with first shipment. The patient will receive a drug information handout for each medication shipped and additional FDA Medication Guides as required.       DISEASE/MEDICATION-SPECIFIC INFORMATION        N/A    SPECIALTY MEDICATION ADHERENCE     Medication Adherence    Patient reported X missed doses in the last month: 0  Specialty Medication: Abiraterone 250mg   Patient is on additional specialty medications: No  Informant: patient              Were doses missed due to medication being on hold? No    abiraterone 250 mg: .10 days of medicine on hand       REFERRAL TO PHARMACIST     Referral to the pharmacist: Not needed      [facility]     Shipping address confirmed in Epic.       Delivery Scheduled: Yes, Expected medication delivery date: 02/06/23.     Medication will be delivered via Next Day Courier to the prescription address in Epic WAM.    Quintella Reichert   [facility] Specialty Technician

## 2023-02-08 ENCOUNTER — Ambulatory Visit: Admit: 2023-02-08 | Discharge: 2023-02-08 | Disposition: A | Discharge status: Home / Self Care | Payer: MEDICARE

## 2023-02-08 DIAGNOSIS — R1032 Left lower quadrant pain: Principal | ICD-10-CM

## 2023-02-08 NOTE — Unmapped (Addendum)
[facility]  Emergency Department Provider Note     ED Clinical Impression     Final diagnoses:   Left inguinal pain (Primary)      HPI, Medical Decision Making, ED Course     HPI: 77 y.o. male who is a past medical history of metastatic prostate cancer and atrial fibrillation who presents with groin pain.  Patient reports several weeks of intermittent left-sided groin and inner thigh discomfort described as burning or heat.  States that today he noticed a slight bulge in his groin more towards his perineum, prompting him to present here.  No history of known hernias.  Denies any fever, redness, drainage, testicular pain, dysuria, hematuria, diarrhea, hematochezia, melena, rash, abdominal pain, or any other complaints.    BP 174/94  - Pulse 92  - Temp 36.8 ??C (98.3 ??F) (Oral)  - Resp 18  - Ht 175.3 cm (5' 9)  - Wt 88.5 kg (195 lb)  - SpO2 98%  - BMI 28.80 kg/m??     Patient is resting comfortably in the room and is in no acute distress.  Heart sounds regular.  Breath sounds clear.  Abdomen soft, nondistended, nontender.  Left groin appears normal without tenderness on examination both supine and standing.  No masses or hernias palpated.  Area appears to be without erythema, induration, or fluctuance.    Differential diagnosis includes neuropathy, radiculopathy, medication side effect from leuprolide given he was started on this in June, versus other etiology.  Concern there is obturator nerve involvement and possible entrapment given location of symptoms.  No evidence of hernia on exam.  No evidence of cellulitis or abscess.  No pain out of proportion on exam or crepitus, do not suspect necrotizing infection.  No back pain, midline spinal tenderness, urinary retention, or incontinence thus less likely to have acute spinal cord pathology.  Considered metastasis to spine however patient without back pain and had an MRI in April 2024 that was negative for metastases, although did show compression fracture of L4 and degenerative changes of L5-S1.  Based on this, do not believe patient requires laboratory workup or imaging at this time.  High suspicion for obturator nerve involvement and this was discussed with the patient.  Plan to discharge patient with recommendations to follow-up with PCP and neurology.  Offered neurology referral however patient would like to see neurology at the [facility].  Return precautions were discussed questions were answered.  Patient verbalizes understanding of this and agrees with this plan.    Diagnostic workup as below.     No orders of the defined types were placed in this encounter.      ED Course       Discussion of Management with other Physicians, QHP or Appropriate Source: None  Independent Interpretation of Studies: If applicable, documented in ED course above.  I have reviewed recent and relevant previous record, including: Progress note with [facility] oncology on 12/14/2022, patient has a history of metastatic prostate cancer involving pelvic and retroperitoneal lymph nodes.  Receiving Eligard and abiraterone.  Escalation of Care including OBS/Admission/Transfer was considered: However, patient was determined to be appropriate for outpatient management.  Social Determinants that significantly affected care: N/A  Prescription drugs considered but not prescribed: N/A  Diagnostic tests considered but not performed: Considered CT imaging however benign abdominal exam, no hernia on exam, and do not suspect infection thus decision was made to hold off.  ____________________________________________    The case was discussed with the attending physician, who  is in agreement with the above assessment and plan.     Additional History Elements     Chief Complaint  Chief Complaint   Patient presents with    Groin Pain     Additional Historian(s): none available    Past Medical History:   Diagnosis Date    Rapid heart beat     Negative cardiac workup in 2020       Past Surgical History:   Procedure Laterality Date IR PERC AUGMENT LUMBAR 1 VERT BODY  11/28/2022    IR PERC AUGMENT LUMBAR 1 VERT BODY 11/28/2022 Myrtie Hawk, MD IMG IR NELS [id number]       Allergies  Flonase [fluticasone] and Budesonide    Family History  No family history on file.    Social History  Social History     Tobacco Use    Smoking status: Former     Types: Cigars     Start date: 12/11/2016    Smokeless tobacco: Never   Substance Use Topics    Alcohol use: Not Currently        Physical Exam     VITAL SIGNS:      Vitals:    02/08/23 0317 02/08/23 0318   BP: 174/94    Pulse: 92    Resp: 18    Temp: 36.8 ??C (98.3 ??F)    TempSrc: Oral    SpO2: 98%    Weight: 88.5 kg (195 lb)    Height:  175.3 cm (5' 9)     Constitutional: Alert and oriented. No acute distress.  Eyes: Conjunctivae are normal.  HEENT: Normocephalic and atraumatic. Conjunctivae clear. No congestion. Moist mucous membranes.   Cardiovascular: Rate as above, regular rhythm. Normal and symmetric distal pulses. Brisk capillary refill. Normal skin turgor.  Respiratory: Normal respiratory effort. Breath sounds are normal. There are no wheezing or crackles heard.  Gastrointestinal: Soft, non-distended, non-tender.  Genitourinary: Left groin appears normal without tenderness.  No masses or hernias palpated.  Area appears to be without erythema, induration, or fluctuance.  Musculoskeletal: Non-tender with normal range of motion in all extremities.  Neurologic: Normal speech and language. No gross focal neurologic deficits are appreciated. Patient is moving all extremities equally, face is symmetric at rest and with speech.  Skin: Skin is warm, dry and intact. No rash noted.  Psychiatric: Mood and affect are normal. Speech and behavior are normal.     Radiology     No orders to display       Pertinent labs & imaging results that were available during my care of the patient were independently interpreted by me and considered in my medical decision making (see chart for details).    Portions of this record have been created using Scientist, clinical (histocompatibility and immunogenetics). Dictation errors have been sought, but may not have been identified and corrected.         Suzzanne Cloud, MD  Resident  02/08/23 [phone]

## 2023-02-08 NOTE — Unmapped (Signed)
Ambulatory from home, PMH Prostate CA last chemo June, pt presents for sudden onset groin pain radiating into LEFT leg that woke him up. Describes pain as heat. Also states he feels a protrusion near his left upper thigh.

## 2023-02-28 NOTE — Unmapped (Signed)
[facility] Refill Coordination Note    Specialty Medication(s) to be Shipped:   Hematology/Oncology: abiraterone 250mg     Other medication(s) to be shipped: No additional medications requested for fill at this time     Eric Good, DOB: 22-Jun-1946  Phone: [phone] (home)       All above HIPAA information was verified with patient.     Was a Nurse, learning disability used for this call? No    Completed refill call assessment today to schedule patient's medication shipment from the [facility] [phone]).  All relevant notes have been reviewed.     Specialty medication(s) and dose(s) confirmed: Regimen is correct and unchanged.   Changes to medications: Eric Good reports no changes at this time.  Changes to insurance: No  New side effects reported not previously addressed with a pharmacist or physician: Yes - Patient reports burning sensation and numbness. Patient would not like to speak to the pharmacist today. Their provider is aware.  Questions for the pharmacist: No    Confirmed patient received a Conservation officer, historic buildings and a Surveyor, mining with first shipment. The patient will receive a drug information handout for each medication shipped and additional FDA Medication Guides as required.       DISEASE/MEDICATION-SPECIFIC INFORMATION        N/A    SPECIALTY MEDICATION ADHERENCE     Medication Adherence    Patient reported X missed doses in the last month: 0  Specialty Medication: Abiraterone 250mg   Patient is on additional specialty medications: No  Informant: patient     Were doses missed due to medication being on hold? No    Abiraterone 250 mg: 16 days of medicine on hand       REFERRAL TO PHARMACIST     Referral to the pharmacist: Not needed      [facility]     Shipping address confirmed in Epic.     Delivery Scheduled: Yes, Expected medication delivery date: 03/13/23.     Medication will be delivered via Next Day Courier to the prescription address in Epic [place].    Eric Good   [facility] Specialty Technician

## 2023-03-12 MED FILL — ABIRATERONE 250 MG TABLET: ORAL | 30 days supply | Qty: 90 | Fill #1

## 2023-03-27 ENCOUNTER — Ambulatory Visit
Admit: 2023-03-27 | Discharge: 2023-03-28 | Payer: MEDICARE | Attending: Hematology & Oncology | Primary: Hematology & Oncology

## 2023-03-27 ENCOUNTER — Other Ambulatory Visit: Admit: 2023-03-27 | Discharge: 2023-03-28 | Payer: MEDICARE

## 2023-03-27 ENCOUNTER — Ambulatory Visit: Admit: 2023-03-27 | Discharge: 2023-03-28 | Payer: MEDICARE

## 2023-03-27 DIAGNOSIS — M5432 Sciatica, left side: Principal | ICD-10-CM

## 2023-03-27 DIAGNOSIS — C61 Malignant neoplasm of prostate: Principal | ICD-10-CM

## 2023-03-27 LAB — HEPATIC FUNCTION PANEL
ALBUMIN: 4 g/dL (ref 3.4–5.0)
ALKALINE PHOSPHATASE: 79 U/L (ref 46–116)
ALT (SGPT): 22 U/L (ref 10–49)
AST (SGOT): 26 U/L (ref <=34)
BILIRUBIN DIRECT: 0.3 mg/dL (ref 0.00–0.30)
BILIRUBIN TOTAL: 0.7 mg/dL (ref 0.3–1.2)
PROTEIN TOTAL: 6.4 g/dL (ref 5.7–8.2)

## 2023-03-27 LAB — PSA: PROSTATE SPECIFIC ANTIGEN: <0.04 ng/mL (ref 0.00–4.00)

## 2023-03-27 LAB — POTASSIUM: POTASSIUM: 4.2 mmol/L (ref 3.5–5.1)

## 2023-03-27 NOTE — Unmapped (Incomplete)
It was a pleasure to see you in the clinic today.    Please call [phone] to reach my nurse navigator Konrad Penta for any issues.    For emergencies on Nights, Weekends and Holidays  Call [phone] for help.      Griffin Basil, MD, PhD  Professor of Medicine  Division of Oncology    [facility]  Genitourinary Oncology Clinic  Nurse Navigator: Konrad Penta  Fax: [phone]     Lab Results   Component Value Date    PSA <0.04 12/14/2022    PSA <0.04 09/21/2022    PSA <0.04 06/13/2022    PSA 0.05 04/24/2022    PSA 0.06 04/03/2022    PSA <0.04 02/27/2022

## 2023-03-27 NOTE — Unmapped (Unsigned)
[facility] Visit Note    Patient Name: Eric Good  Patient Age: 77 y.o.  Encounter Date: 03/27/2023  Attending Provider:  Young E. 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    Eric Good is a 46 y.o. man with newly diagnosed de novo metastatic prostate cancer, T2N1M1a, involving pelvic and retroperitoneal lymph nodes on conventional CT and bone scans. PSA being 45 and Gleason 4+5=9 disease also support this diagnosis.    I discussed with the patient the long term prognosis of prostate cancer and treatment options, such as androgen deprivation therapy, and benefit and side effects of ADT.  Side effects includes but are not limited to loss of libido, erectile dysfunction, hot flashes, fatigue, weight gain, metabolic syndrome that increases the risk of diabetes and cardiovascular events, osteoporosis, and reduced muscle mass.  I have counseled the patient on the need to take calcium and vitamin D as well as regular weight bearing exercise to minimize the side effects of ADT. It is expected that virtually all patients will develop progressive disease while on ADT and at that time additional treatment would be required, but ultimately, the patient may pass away from prostate cancer.    A series of recent studies demonstrate the benefit of additional therapy (docetaxel per CHAARTED and STAMPEDE, abiraterone/prednisone per LATITUTUDE, enzalutamide per ARCHES and ENZAMET, apalutamide per TITAN).  Therefore, the current option would be to add one of AR targeted agents. Docetaxel chemo would not be recommended for low volume patients.  I discussed the consideration of additional therapy and the benefit and side effects of an agent such as abiraterone.    On 06/16/21, patient returns after PSMA-PET, which showed uptake in the prostate, multiple retrocaval and left iliac lymph nodes, and the left scapular body, concerning for metastatic disease. We discussed the implications of these findings and re-discussed the risks and benefits of ADT as well as the possible benefit from the addition of an AR-targeted therapy. Patient elected today to start treatment with ADT.    In 07/2021, PSA down on ADT. After discussion of benefit/side effects, abiraterone and prednisone prescribed. Will get somatic tumor genomic testing. Also referral to radiation oncology.    In 08/2021, PSA is 1.33. His LFT's are okay. I recommend obtaining germline testing at his next visit. I also spoke to him about joining the [employer] study. Pt was agreeable to both.    In 10/2021, PSA is 0.39. His LFT's are slightly elevated.  We will continue to monitor LFT's every 2 weeks and hope to see improvement/normalization.  Otherwise tolerating ADT, abi, and pred well. I'll see him back in mid-June for his next dose of Eligard. He was also noted to have bradycardia on vitals.  EKG today without bradycardia but noted ventricular bigeminy/frequent PVCs.  He is asymptomatic today but did have an episode of chest pain for 20 minutes 4 nights ago.  We will have him see cardiology again.     In  01/2022, doing well. Nothing remarkable on somatic/germline testing. PSA, labs great. Continue with current treatment of ADT plus abiraterone.    On 06/13/2022, PSA is undetectable today. DEXA results show T -1.3.  MRI pelvis to r/o prostate invading into rectum.    On 09/21/2022, PSA is undetectable. Prostate MRI on 07/04/2022 showed restricted diffusion at the right base of the prostate, suspicious for locally recurrent prostate carcinoma. No evidence of rectal invasion. MRI of L spine ordered for back pain and to follow up on L4 compression fracture. Continue current therapy.     On 12/14/2022, PSA is undetectable. Proceed with Eligard injection today. Continue current therapy with abiraterone. Consider addition of a bone strengthening agent, restage DEXA prior to next appointment.     Today on 03/27/2023, PSA is ***.    Plan  Continue ADT with Eligard 45 mg subcutaneous, given last on 12/14/2022, due next on or after 06/15/2023   Abiraterone 1000 mg every day and prednisone 5 mg daily. Follow LFT's, K, BP.  Pt will have completed 2 years of therapy in 07/2023.  Completed IMRT to the prostate and pelvic nodes and SRBT to the left scapula and prostate on 09/05/2021.   Somatic tumor mutation profiling done, no targetable mutations  Germline testing negative  Resolved back pain, status post kyphoplasty to L4 compression fracture  Repeat DEXA scan in 3 months  Consider Reclast pending results   Return in 3 months for follow up with labs      Reason for Visit  Follow up of prostate cancer    History of Present Illness:  Hematology/Oncology History Overview Note   In 04/2021, screening PSA of 49, then 45.3. DRE abnormal, firm at L base  In 05/2021, prostate biopsy showed Gleason 4+5=9, 6/12 cores involved. CT showed retroperitoneal and pelvic adenopathy. Bone scan showed single focus in L scapula, nonspecific.  In 06/2021, PSMA PET showed retroperitoneal/pelvic node mets, L scapular met.  ADT started with Eligard  In 07/2021, abiraterone started  In 08/2021, s/p RT to prostate, pelvic nodes, SBRT to L scapula  In 10/2022, s/p kyphoplasty L4 for compression fracture     Prostate cancer (CMS-HCC)   05/30/2021 Initial Diagnosis    Prostate cancer (CMS-HCC)     06/02/2021 -  Cancer Staged    Staging form: Prostate, AJCC 8th Edition  - Clinical: Stage IVB (cT2a, cN1, cM1b, PSA: 49, Grade Group: 5) - Signed by Maurie Boettcher, MD on 06/16/2021       06/16/2021 Endocrine/Hormone Therapy    OP PROSTATE LEUPROLIDE (ELIGARD) 45 MG EVERY 6 MONTHS  Plan Provider: Maurie Boettcher, MD     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)   06/16/2021 Initial Diagnosis    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 Back pain resolved s/p Kyphoplasty. Receiving ultrasound of heart. No other complaints noted.       Allergies:  Allergies   Allergen Reactions  Flonase [Fluticasone]      Nose bleed    Budesonide Nausea Only     Other reaction(s): Other (See Comments)    Epistaxis       Current Medications:    Current Outpatient Medications:     abiraterone (ZYTIGA) 250 mg tablet, Take 3 tablets (750 mg total) by mouth daily., Disp: 90 tablet, Rfl: 11    ascorbic acid, vitamin C, (ASCORBIC ACID) 250 mg Chew, Chew 2 tablets (500 mg total)  in the morning., Disp: , Rfl:     calcium carbonate 300 mg (750 mg) Chew, , Disp: , Rfl:     carboxymethyl-gly-poly80-PF (REFRESH OPTIVE MEGA-3, PF,) 0.5-1-0.5 % Dpet, Apply 3 drops to eye two (2) times a day. Three drops on left eye and two drops on the right eye, Disp: , Rfl:     cholecalciferol, vitamin D3-125 mcg, 5,000 unit,, 125 mcg (5,000 unit) tablet, Take 1 tablet (125 mcg total) by mouth two (2) times a day., Disp: , Rfl:     esomeprazole (NEXIUM) 20 MG capsule, Take 1 capsule (20 mg total) by mouth in the morning., Disp: , Rfl:     fish oil-omega-3 fatty acids 300-1,000 mg capsule, Take 2 capsules (2 g total) by mouth daily., Disp: , Rfl:     leuprolide acetate, 6 month, (ELIGARD) 45 mg injection, Inject 45 mg under the skin every six (6) months., Disp: , Rfl:     multivitamin (TAB-A-VITE/THERAGRAN) per tablet, Take 1 tablet by mouth daily., Disp: , Rfl:     predniSONE (DELTASONE) 5 MG tablet, Take 1 tablet (5 mg total) by mouth daily., Disp: 30 tablet, Rfl: 11    propylene glycol (SYSTANE COMPLETE) 0.6 % Drop, Apply 3 drops to eye once. Three drops on the left and two drops on the right, Disp: , Rfl:     vitamin B complex vit C no.3 (VITAMIN B COMP AND C NO.3 ORAL), Take by mouth daily as needed. , Disp: , Rfl:     vitamin D3-folic acid 125 mcg (5,000 unit)-1 mg Tab, Take 125 mcg by mouth in the morning., Disp: , Rfl:     zinc gluconate 50 mg (7 mg elemental zinc) tablet, Take 1 tablet (50 mg total) by mouth daily., Disp: , Rfl:     Past Medical History and Social History  Past Medical History:   Diagnosis Date    Rapid heart beat     Negative cardiac workup in 2020      Past Surgical History:   Procedure Laterality Date    IR PERC AUGMENT LUMBAR 1 VERT BODY  11/28/2022    IR PERC AUGMENT LUMBAR 1 VERT BODY 11/28/2022 Myrtie Hawk, MD IMG IR NELS [id number]        Social History     Occupational History    Not on file   Tobacco Use    Smoking status: Former     Types: Cigars     Start date: 12/11/2016    Smokeless tobacco: Never   Substance and Sexual Activity    Alcohol use: Not Currently    Drug use: Not on file    Sexual activity: Not on file       Family History    Prostate Cancer Family History Assessment:  History of cancer in children (yes/no; if yes, what type AND age of diagnosis): No  History of cancer in siblings (yes/no; if yes, provide relation, type of cancer, AND age of diagnosis): No  History of  cancer in parents (yes/no; if yes, please specify parent, type of cancer, AND age of diagnosis): Yes, sister with stomach and eye cancer, age 80  History of cancer in aunts/uncles/grandparents (yes/no; if yes, provide relation, type of cancer, AND age of diagnosis): No      Review of Systems:  A comprehensive review of 10 systems was negative except for pertinent positives noted in HPI.    Physical Examination:    VITAL SIGNS:  There were no vitals taken for this visit.  ECOG Performance Status: 1  GENERAL: Well-developed, well-nourished patient in no acute distress.  HEAD: Normocephalic and atraumatic.  EYES: Conjunctivae are normal. No scleral icterus.  MOUTH/THROAT: Oropharynx is clear and moist.  No mucosal lesions.  NECK: Supple, no thyromegaly.  LYMPHATICS: No palpable cervical, supraclavicular, or axillary adenopathy.  CARDIOVASCULAR: Normal rate, regular rhythm and normal heart sounds.  Exam reveals no gallop and no friction rub.  No murmur heard.  PULMONARY/CHEST: Effort normal and breath sounds normal. No respiratory distress.  GASTROINTESTINAL/ABDOMINAL:  Soft. There is no distension. There is no tenderness. There is no rebound and no guarding.  MUSCULOSKELETAL: No clubbing, cyanosis, or lower extremity edema.  PSYCHIATRIC: Alert and oriented.  Normal mood and affect.  NEUROLOGIC: No focal motor deficit. Normal gait.  SKIN: Skin is warm, dry, and intact.       Results/Orders:      No visits with results within 2 Day(s) from this visit.   Latest known visit with results is:   Lab on 12/14/2022   Component Date Value Ref Range Status    Potassium 12/14/2022 4.6  3.5 - 5.1 mmol/L Final    PSA 12/14/2022 <0.04  0.00 - 4.00 ng/mL Final    Albumin 12/14/2022 3.9  3.4 - 5.0 g/dL Final    Total Protein 12/14/2022 6.5  5.7 - 8.2 g/dL Final    Total Bilirubin 12/14/2022 1.0  0.3 - 1.2 mg/dL Final    Bilirubin, Direct 12/14/2022 0.30  0.00 - 0.30 mg/dL Final    AST 76/28/3151 27  <=34 U/L Final    ALT 12/14/2022 19  10 - 49 U/L Final    Alkaline Phosphatase 12/14/2022 89  46 - 116 U/L Final     Lab Results   Component Value Date    PSA <0.04 12/14/2022    PSA <0.04 09/21/2022 PSA <0.04 06/13/2022    PSA 0.05 04/24/2022    PSA 0.06 04/03/2022    PSA <0.04 02/27/2022         Orders placed or performed in visit on 01/31/23    Test Billing: abiraterone (ZYTIGA) 250 mg tablet           Molecular Pathology  Tumor mutation profiling on 08/01/18  TMB 0 m/Mb  MSS  APC  MTA  PTEN  CDKN2A/B  CIC  TP53    Germline testing [facility] on 09/12/21  Negative    Pathology  05/04/2021  [A] PROSTATE, LEFT BASE:   ACINAR ADENOCARCINOMA, GLEASON 4+3=7  (GG   3), INVOLVING 1 OF 1 CORES, MEASURING 5  MM ( 71%). 80% PATTERN 4;   CRIBRIFORM PATTERN 4 PRESENT    [B] PROSTATE, LEFT MID:   ACINAR ADENOCARCINOMA, GLEASON 4+3=7  (GG 3),   INVOLVING 1 OF 1 CORES, MEASURING 5  MM ( 83%). 80% PATTERN 4;   CRIBRIFORM PATTERN 4 PRESENT    [C] PROSTATE, LEFT APEX:   ACINAR ADENOCARCINOMA, GLEASON 4+3=7  (GG   3), INVOLVING 1 OF 2 CORES, MEASURING  4  MM ( 40%). 70% PATTERN 4;   CRIBRIFORM PATTERN 4 PRESENT    [D] PROSTATE, RIGHT BASE:   NEGATIVE FOR MALIGNANCY.    [E] PROSTATE, RIGHT MID:   NEGATIVE FOR MALIGNANCY.    [F] PROSTATE, RIGHT APEX:   NEGATIVE FOR MALIGNANCY.    [G] PROSTATE, LEFT LATERAL BASE:   ACINAR ADENOCARCINOMA, GLEASON 4+4=8   (GG 4), INVOLVING 1 OF 1 CORES, MEASURING 5  MM ( 50%). PERINEURAL   INVASION PRESENT    [H] PROSTATE, LEFT LATERAL MID:   ACINAR ADENOCARCINOMA, GLEASON 4+5=9   (GG 5), INVOLVING 1 OF 1 CORES, MEASURING 9  MM ( 100%). CRIBRIFORM   PATTERN 4 PRESENT    [I] PROSTATE, LEFT LATERAL APEX:   ACINAR ADENOCARCINOMA, GLEASON 4+3=7   (GG 3), INVOLVING 2 OF 2 CORES, MEASURING 8  MM ( 100%). 80% PATTERN 4;   CRIBRIFORM PATTERN 4 PRESENT; PERINEURAL INVASION PRESENT    [J] PROSTATE, RIGHT LATERAL BASE:   NEGATIVE FOR MALIGNANCY.    [K] PROSTATE, RIGHT LATERAL MID:   NEGATIVE FOR MALIGNANCY.    [L] PROSTATE, RIGHT LATERAL APEX:   NEGATIVE FOR MALIGNANCY.     Imaging results:  CT AP 05/19/2021  LYMPH NODES: Retroperitoneal and pelvic adenopathy with index lesions as follows  --1.5 cm retrocaval lymph node  -- 1.4 cm left common iliac lymph node.  --1.9 cm left external iliac lymph node  --1.8 cm left internal iliac lymph  node  IMPRESSION:  Retroperitoneal and  left pelvic adenopathy, suspicious for metastatic disease.     Heterogeneous appearance of the prostate, consistent with known neoplasm.     Indeterminate lesion in the right posterior 12th rib. Recommend correlation with concurrent bone scan.     Mildly hyperenhancing right renal lesion, suspicious for cortical neoplasm. Recommend correlation with prior imaging if available. If not, MRI of the abdomen with and without contrast may be performed for definitive characterization.    Bone scan 05/19/2021  Focal area of increased radiotracer uptake within the left scapula which could represent osseous metastasis or remote trauma, indeterminate      PET CT Skull Base to Thigh 06/13/2021  -Mild radiotracer uptake within the prostate, likely corresponding to patient's known neoplasm.   -Multiple radiotracer avid retrocaval and left iliac lymph nodes, as described above, concerning for metastatic disease. Tiny right iliac/pelvic sidewall lymph nodes with no significant uptake, indeterminate.   -Radiotracer uptake within the left scapular body, corresponding to increased tracer uptake on recent bone scan, concerning for osseous metastatic disease.    MRI Upper Extremity Non-Joint Left W Wo Contrast  Result Date: 05/02/2022   Impression: 1.Enhancing lytic lesion seen within the scapula compatible with known metastasis which appears overall similar to prior CT allowing for differences in modality. 2.Surrounding intramuscular edema and enhancement and areas of possible muscle necrosis. Findings are favored to represent posttreatment changes in setting of recent radiation therapy. 3.Glenohumeral degenerative changes with small joint effusion and degenerative labral tearing/chondrosis.      DEXA bone scan  FINDINGS  Lumbar Spine   Excluded levels: L4  BMD: 0.963 (g/cm) T score: -1.0  Lumbar WHO classification: NORMAL BONE MINERAL DENSITY.     Left Hip   Femoral Neck:   BMD: 0.747 (g/cm)   T score: -1.3  Total Hip   BMD: 1.003 (g/cm)   T score: -0.2  Hip WHO classification: LOW BONE MASS.     MRI Prostate W Wo Contrast (07/04/2022)  Impression: Restricted diffusion identified  at the right base of the prostate at the region of the proximal right seminal vesicle. Findings are suspicious for locally recurrent prostate carcinoma. Could consider correlation with biopsy or PSMA PET. DynaCAD imaging sent to PACS PI-RADS v2.1 Assessment Categories PI-RADS 1  Very low (clinically significant cancer is highly unlikely to be present) PI-RADS 2  Low (clinically significant cancer is unlikely to be present) PI-RADS 3  Intermediate (the presence of clinically significant cancer is equivocal) PI-RADS 4  High (clinically significant cancer is likely to be present) PI-RADS 5  Very high (clinically significant cancer is highly likely to be present      IR Perc Augment Lumbar 1 Vert Body  Result Date: 11/28/2022   Impression: Vertebral augmentation of the L4 vertebral body Plan: Return to clinic in 3 weeks with L-spine x-ray     MRI Lumbar Spine W Wo Contrast  Result Date: 10/06/2022   Impression: Compression fracture of L4 as described above. Enhancement diffusely involves the vertebral body and there is no evidence of epidural extension/curtain sign to suggest underlying pathologic lesion. No definite metastatic lesions throughout the visualized portions of the spine. Degenerative changes, worst at L5-S1. Mild congenital canal narrowing worsened by facet degenerative changes as well as mild bilateral foraminal narrowing at L4-5.       I attest that I, Alto Denver, personally documented this note while acting as scribe for Maurie Boettcher, MD       Alto Denver, Scribe.  03/27/2023     Documentation assistance provided by Medical Scribe, Alto Denver who was present during the entirety of the visit. I reviewed the note below and validated all of the information provided to ensure accuracy and completeness.      Maurie Boettcher, MD

## 2023-04-09 NOTE — Unmapped (Signed)
[facility] Refill Coordination Note    Specialty Medication(s) to be Shipped:   Hematology/Oncology: abiraterone 250mg     Other medication(s) to be shipped: No additional medications requested for fill at this time     Eric Good, DOB: [birth year]  Phone: [phone] (home)       All above HIPAA information was verified with patient.     Was a Nurse, learning disability used for this call? No    Completed refill call assessment today to schedule patient's medication shipment from the [facility] [phone]).  All relevant notes have been reviewed.     Specialty medication(s) and dose(s) confirmed: Regimen is correct and unchanged.   Changes to medications: Eric Good reports no changes at this time.  Changes to insurance: No  New side effects reported not previously addressed with a pharmacist or physician: Yes - Patient reports burning sensation and numbness. Patient would not like to speak to the pharmacist today. Their provider is aware.  Questions for the pharmacist: No    Confirmed patient received a Conservation officer, historic buildings and a Surveyor, mining with first shipment. The patient will receive a drug information handout for each medication shipped and additional FDA Medication Guides as required.       DISEASE/MEDICATION-SPECIFIC INFORMATION        N/A    SPECIALTY MEDICATION ADHERENCE     Medication Adherence    Patient reported X missed doses in the last month: 1  Specialty Medication: Abiraterone 250mg   Patient is on additional specialty medications: No  Informant: patient     Were doses missed due to medication being on hold? No    Abiraterone 250 mg: 4 days of medicine on hand       REFERRAL TO PHARMACIST     Referral to the pharmacist: Not needed      [facility]     Shipping address confirmed in Epic.     Delivery Scheduled: Yes, Expected medication delivery date: 04/12/23.     Medication will be delivered via Next Day Courier to the prescription address in Epic [place].    Eric Good   [facility] Specialty Technician

## 2023-04-11 ENCOUNTER — Telehealth: Payer: Self-pay

## 2023-04-11 MED FILL — ABIRATERONE 250 MG TABLET: ORAL | 30 days supply | Qty: 90 | Fill #2

## 2023-04-11 NOTE — Telephone Encounter (Signed)
[facility] and [facility] to be faxed to them. They were the ones that referred the patient for the colonoscopy. Printed colonoscopy report but no path was done. Faxed colonoscopy report to [phone] and got confirmation fax went through

## 2023-04-16 ENCOUNTER — Ambulatory Visit: Admit: 2023-04-16 | Discharge: 2023-04-17 | Payer: MEDICARE

## 2023-04-16 MED ADMIN — gadopiclenol injection 8 mL: 8 mL | INTRAVENOUS | @ 19:00:00 | Stop: 2023-04-16

## 2023-04-30 NOTE — Unmapped (Signed)
[facility] Refill Coordination Note    Specialty Medication(s) to be Shipped:   Hematology/Oncology: abiraterone 250mg     Other medication(s) to be shipped: No additional medications requested for fill at this time     Eric Good, DOB: 1946-06-10  Phone: [phone] (home)       All above HIPAA information was verified with patient.     Was a Nurse, learning disability used for this call? No    Completed refill call assessment today to schedule patient's medication shipment from the [facility]  [phone]).  All relevant notes have been reviewed.     Specialty medication(s) and dose(s) confirmed: Regimen is correct and unchanged.   Changes to medications: Eric Good reports no changes at this time.  Changes to insurance: No  New side effects reported not previously addressed with a pharmacist or physician: None reported  Questions for the pharmacist: No    Confirmed patient received a Conservation officer, historic buildings and a Surveyor, mining with first shipment. The patient will receive a drug information handout for each medication shipped and additional FDA Medication Guides as required.       DISEASE/MEDICATION-SPECIFIC INFORMATION        N/A    SPECIALTY MEDICATION ADHERENCE     Medication Adherence    Specialty Medication: abiraterone 250 mg tablet (ZYTIGA)  Patient is on additional specialty medications: No              Were doses missed due to medication being on hold? No    Abiraterone  250 mg: 16 days of medicine on hand       REFERRAL TO PHARMACIST     Referral to the pharmacist: Not needed      [facility]     Shipping address confirmed in Epic.       Delivery Scheduled: Yes, Expected medication delivery date: 11/08.     Medication will be delivered via Next Day Courier to the prescription address in Epic [place].    Eric Good   [facility]  Specialty Technician

## 2023-05-10 MED FILL — ABIRATERONE 250 MG TABLET: ORAL | 30 days supply | Qty: 90 | Fill #3

## 2023-06-05 ENCOUNTER — Other Ambulatory Visit: Admit: 2023-06-05 | Discharge: 2023-06-06 | Payer: MEDICARE

## 2023-06-05 ENCOUNTER — Ambulatory Visit
Admit: 2023-06-05 | Discharge: 2023-06-06 | Payer: MEDICARE | Attending: Hematology & Oncology | Primary: Hematology & Oncology

## 2023-06-05 DIAGNOSIS — C61 Malignant neoplasm of prostate: Principal | ICD-10-CM

## 2023-06-05 DIAGNOSIS — C778 Secondary and unspecified malignant neoplasm of lymph nodes of multiple regions: Principal | ICD-10-CM

## 2023-06-05 DIAGNOSIS — C7951 Secondary malignant neoplasm of bone: Principal | ICD-10-CM

## 2023-06-05 LAB — HEPATIC FUNCTION PANEL
ALBUMIN: 4.1 g/dL (ref 3.4–5.0)
ALKALINE PHOSPHATASE: 83 U/L (ref 46–116)
ALT (SGPT): 23 U/L (ref 10–49)
AST (SGOT): 24 U/L (ref <=34)
BILIRUBIN DIRECT: 0.3 mg/dL (ref 0.00–0.30)
BILIRUBIN TOTAL: 0.8 mg/dL (ref 0.3–1.2)
PROTEIN TOTAL: 6.5 g/dL (ref 5.7–8.2)

## 2023-06-05 LAB — PSA: PROSTATE SPECIFIC ANTIGEN: <0.04 ng/mL (ref 0.00–4.00)

## 2023-06-05 LAB — POTASSIUM: POTASSIUM: 4.7 mmol/L (ref 3.5–5.1)

## 2023-06-05 NOTE — Unmapped (Addendum)
It was a pleasure to see you in the clinic today.    Your recent MRI did not show anything concerning to me at this time. There is no intervention needed at this time.     Immediately stop the abiraterone medication. You should also stop the prednisone medication at this time. If you experience any trouble with stopping the prednisone medication, you can take it every other day for a few days before completely stopping the medication.     In the future, we may have to restart treatment. If this is the case, we can consider an alternative medication that has less cardiac side effects.     We will have your labs checked every 2 months. You can have these labs drawn locally for your convenience. Plan to return to clinic in 4 months for follow up.     Please call [phone] to reach my nurse navigator Konrad Penta for any issues.    For emergencies on Nights, Weekends and Holidays  Call [phone] for help.      Griffin Basil, MD, PhD  Professor of Medicine  Division of Oncology    [facility]  Genitourinary Oncology Clinic  Nurse Navigator: Konrad Penta  Fax: [phone]     Lab Results   Component Value Date    PSA <0.04 06/05/2023    PSA <0.04 03/27/2023    PSA <0.04 12/14/2022    PSA <0.04 09/21/2022    PSA <0.04 06/13/2022    PSA 0.05 04/24/2022

## 2023-06-05 NOTE — Unmapped (Signed)
[facility] Visit Note    Patient Name: Eric Good  Patient Age: 77 y.o.  Encounter Date: 06/05/2023  Attending Provider:  Willadene Mounsey E. 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    Eric Good is a 60 y.o. man with newly diagnosed de novo metastatic prostate cancer, T2N1M1a, involving pelvic and retroperitoneal lymph nodes on conventional CT and bone scans. PSA being 45 and Gleason 4+5=9 disease also support this diagnosis.    I discussed with the patient the long term prognosis of prostate cancer and treatment options, such as androgen deprivation therapy, and benefit and side effects of ADT.  Side effects includes but are not limited to loss of libido, erectile dysfunction, hot flashes, fatigue, weight gain, metabolic syndrome that increases the risk of diabetes and cardiovascular events, osteoporosis, and reduced muscle mass.  I have counseled the patient on the need to take calcium and vitamin D as well as regular weight bearing exercise to minimize the side effects of ADT. It is expected that virtually all patients will develop progressive disease while on ADT and at that time additional treatment would be required, but ultimately, the patient may pass away from prostate cancer.    A series of recent studies demonstrate the benefit of additional therapy (docetaxel per CHAARTED and STAMPEDE, abiraterone/prednisone per LATITUTUDE, enzalutamide per ARCHES and ENZAMET, apalutamide per TITAN).  Therefore, the current option would be to add one of AR targeted agents. Docetaxel chemo would not be recommended for low volume patients.  I discussed the consideration of additional therapy and the benefit and side effects of an agent such as abiraterone.    On 06/16/21, patient returns after PSMA-PET, which showed uptake in the prostate, multiple retrocaval and left iliac lymph nodes, and the left scapular body, concerning for metastatic disease. We discussed the implications of these findings and re-discussed the risks and benefits of ADT as well as the possible benefit from the addition of an AR-targeted therapy. Patient elected today to start treatment with ADT.    In 07/2021, PSA down on ADT. After discussion of benefit/side effects, abiraterone and prednisone prescribed. Will get somatic tumor genomic testing. Also referral to radiation oncology.    In 08/2021, PSA is 1.33. His LFT's are okay. I recommend obtaining germline testing at his next visit. I also spoke to him about joining the [employer] study. Pt was agreeable to both.    In 10/2021, PSA is 0.39. His LFT's are slightly elevated.  We will continue to monitor LFT's every 2 weeks and hope to see improvement/normalization.  Otherwise tolerating ADT, abi, and pred well. I'll see him back in mid-June for his next dose of Eligard. He was also noted to have bradycardia on vitals.  EKG today without bradycardia but noted ventricular bigeminy/frequent PVCs.  He is asymptomatic today but did have an episode of chest pain for 20 minutes 4 nights ago.  We will have him see cardiology again.     In  01/2022, doing well. Nothing remarkable on somatic/germline testing. PSA, labs great. Continue with current treatment of ADT plus abiraterone.    On 06/13/2022, PSA is undetectable today. DEXA results show T -1.3.  MRI pelvis to r/o prostate invading into rectum.    On 09/21/2022, PSA is undetectable. Prostate MRI on 07/04/2022 showed restricted diffusion at the right base of the prostate, suspicious for locally recurrent prostate carcinoma. No evidence of rectal invasion. MRI of L spine ordered for back pain and to follow up on L4 compression fracture. Continue current therapy.     On 12/14/2022, PSA is undetectable. Proceed with Eligard injection today. Continue current therapy with abiraterone. Consider addition of a bone strengthening agent, restage DEXA prior to next appointment.     On 03/27/2023, PSA remains undetectable. DEXA scan from today was stable. Due to nerve pain in the left leg, MRI ordered to be completed as soon as possible. Plan to stop abiraterone and prednisone at next visit, around 06/15/2023.     Today on 06/05/2023, PSA remains undetectable. Labs are normal. Recent MRI of spine requires no intervention. Discussed intermittent therapy due to treatment response along with cardiovascular side effects. Patient will immediately stop abiraterone and prednisone medications. Will continue with intermittent therapy at this time due to treatment response. Plan to repeat labs every 2 months, with follow up in 4 months.     Plan  Continue with intermittent therapy   Stop ADT with Eligard 45 mg subcutaneous, given last on 12/14/2022, completed 2 years of therapy in 06/2023  Stop Abiraterone 1000 mg every day and prednisone 5 mg daily  Completed IMRT to the prostate and pelvic nodes and SRBT to the left scapula and prostate on 09/05/2021.  Somatic tumor mutation profiling done, no targetable mutations  Germline testing negative  Resolved back pain, status post kyphoplasty to L4 compression fracture  DEXA scan (03/27/2023) was stable  Repeat labs in 2 months, can be drawn locally.   RTC in 4 months     I personally spent 40 minutes face-to-face and non-face-to-face in the care of this patient, which includes all pre, intra, and post visit time on the date of service.  All documented time was specific to the E/M visit and does not include any procedures that may have been performed.      Reason for Visit  Follow up of prostate cancer    History of Present Illness:  Hematology/Oncology History Overview Note   In 04/2021, screening PSA of 49, then 45.3. DRE abnormal, firm at L base  In 05/2021, prostate biopsy showed Gleason 4+5=9, 6/12 cores involved. CT showed retroperitoneal and pelvic adenopathy. Bone scan showed single focus in L scapula, nonspecific.  In 06/2021, PSMA PET showed retroperitoneal/pelvic node mets, L scapular met.  ADT started with Eligard  In 07/2021, abiraterone started  In 08/2021, s/p RT to prostate, pelvic nodes, SBRT to L scapula  In 10/2022, s/p kyphoplasty L4 for compression fracture  In 06/2023, PSA <0.04, abiraterone and ADT stopped, to pursue intermittent therapy     Prostate cancer (CMS-HCC)   05/30/2021 Initial Diagnosis    Prostate cancer (CMS-HCC)     06/02/2021 -  Cancer Staged    Staging form: Prostate, AJCC 8th Edition  - Clinical: Stage IVB (cT2a, cN1, cM1b, PSA: 49, Grade Group: 5) - Signed by Maurie Boettcher, MD on 06/16/2021       06/16/2021 Endocrine/Hormone Therapy    OP PROSTATE LEUPROLIDE (ELIGARD) 45 MG EVERY 6 MONTHS  Plan  Provider: Maurie Boettcher, MD     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)   06/16/2021 Initial Diagnosis    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 He is doing okay overall. He continue sot experience some pain in his leg, but is currently taking medication for neuropathy which provides slight relief. He recently underwent cardiac testing at the [facility] followed by Dr. Alden Benjamin, which found decrease strength in the heart, without any other blockages noted. No other concerns noted.       Allergies:  Allergies   Allergen Reactions    Flonase [Fluticasone]      Nose bleed    Budesonide Nausea Only     Other reaction(s): Other (See Comments)    Epistaxis       Current Medications:    Current Outpatient Medications:     abiraterone (ZYTIGA) 250 mg tablet, Take 3 tablets (750 mg total) by mouth daily., Disp: 90 tablet, Rfl: 11    ascorbic acid, vitamin C, (ASCORBIC ACID) 250 mg Chew, Chew 2 tablets (500 mg total)  in the morning., Disp: , Rfl:     atorvastatin calcium (ATORVASTATIN ORAL), Take 20 mg by mouth daily with evening meal., Disp: , Rfl:     calcium carbonate 300 mg (750 mg) Chew, , Disp: , Rfl:     carboxymethyl-gly-poly80-PF (REFRESH OPTIVE MEGA-3, PF,) 0.5-1-0.5 % Dpet, Apply 3 drops to eye two (2) times a day. Three drops on left eye and two drops on the right eye, Disp: , Rfl:     cholecalciferol, vitamin D3-125 mcg, 5,000 unit,, 125 mcg (5,000 unit) tablet, Take 1 tablet (125 mcg total) by mouth two (2) times a day., Disp: , Rfl:     esomeprazole (NEXIUM) 20 MG capsule, Take 1 capsule (20 mg total) by mouth in the morning., Disp: , Rfl:     fish oil-omega-3 fatty acids 300-1,000 mg capsule, Take 2 capsules (2 g total) by mouth daily., Disp: , Rfl:     leuprolide acetate, 6 month, (ELIGARD) 45 mg injection, Inject 45 mg under the skin every six (6) months., Disp: , Rfl:     losartan potassium (LOSARTAN ORAL), Take 40 mg by mouth in the morning., Disp: , Rfl:     multivitamin (TAB-A-VITE/THERAGRAN) per tablet, Take 1 tablet by mouth daily., Disp: , Rfl:     predniSONE (DELTASONE) 5 MG tablet, Take 1 tablet (5 mg total) by mouth daily., Disp: 30 tablet, Rfl: 11    propylene glycol (SYSTANE COMPLETE) 0.6 % Drop, Apply 3 drops to eye once. Three drops on the left and two drops on the right, Disp: , Rfl:     vitamin B complex vit C no.3 (VITAMIN B COMP AND C NO.3 ORAL), Take by mouth daily as needed. , Disp: , Rfl:     vitamin D3-folic acid 125 mcg (5,000 unit)-1 mg Tab, Take 125 mcg by mouth in the morning., Disp: , Rfl:  zinc gluconate 50 mg (7 mg elemental zinc) tablet, Take 1 tablet (50 mg total) by mouth daily., Disp: , Rfl:     Past Medical History and Social History  Past Medical History:   Diagnosis Date    Rapid heart beat     Negative cardiac workup in 2020      Past Surgical History:   Procedure Laterality Date    IR PERC AUGMENT LUMBAR 1 VERT BODY  11/28/2022    IR PERC AUGMENT LUMBAR 1 VERT BODY 11/28/2022 Myrtie Hawk, MD IMG IR NELS [id number]        Social History     Occupational History    Not on file   Tobacco Use    Smoking status: Former     Types: Cigars     Start date: 12/11/2016    Smokeless tobacco: Never   Substance and Sexual Activity    Alcohol use: Not Currently    Drug use: Not on file    Sexual activity: Not on file       Family History    Prostate Cancer Family History Assessment:  History of cancer in children (yes/no; if yes, what type AND age of diagnosis): No  History of cancer in siblings (yes/no; if yes, provide relation, type of cancer, AND age of diagnosis): No  History of cancer in parents (yes/no; if yes, please specify parent, type of cancer, AND age of diagnosis): Yes, sister with stomach and eye cancer, age 12  History of cancer in aunts/uncles/grandparents (yes/no; if yes, provide relation, type of cancer, AND age of diagnosis): No      Review of Systems:  A comprehensive review of 10 systems was negative except for pertinent positives noted in HPI.    Physical Examination:    VITAL SIGNS:  BP 124/70  - Pulse 70  - Temp 36.4 ??C (97.6 ??F) (Temporal)  - Resp 18  - Ht 175.3 cm (5' 9)  - Wt 89.9 kg (198 lb 4.8 oz)  - SpO2 97%  - BMI 29.28 kg/m??   ECOG Performance Status: 1  GENERAL: Well-developed, well-nourished patient in no acute distress.  HEAD: Normocephalic and atraumatic.  EYES: Conjunctivae are normal. No scleral icterus.  MOUTH/THROAT: Oropharynx is clear and moist.  No mucosal lesions.  NECK: Supple, no thyromegaly.  LYMPHATICS: No palpable cervical, supraclavicular, or axillary adenopathy.  CARDIOVASCULAR: Normal rate, regular rhythm and normal heart sounds.  Exam reveals no gallop and no friction rub.  No murmur heard.  PULMONARY/CHEST: Effort normal and breath sounds normal. No respiratory distress.  GASTROINTESTINAL/ABDOMINAL:  Soft. There is no distension. There is no tenderness. There is no rebound and no guarding.  MUSCULOSKELETAL: No clubbing, cyanosis, or lower extremity edema.  PSYCHIATRIC: Alert and oriented.  Normal mood and affect.  NEUROLOGIC: No focal motor deficit. Normal gait.  SKIN: Skin is warm, dry, and intact.       Results/Orders:      Lab on 06/05/2023   Component Date Value Ref Range Status    PSA 06/05/2023 <0.04  0.00 - 4.00 ng/mL Final    Potassium 06/05/2023 4.7  3.5 - 5.1 mmol/L Final    Albumin 06/05/2023 4.1  3.4 - 5.0 g/dL Final    Total Protein 06/05/2023 6.5  5.7 - 8.2 g/dL Final    Total Bilirubin 06/05/2023 0.8  0.3 - 1.2 mg/dL Final    Bilirubin, Direct 06/05/2023 0.30  0.00 - 0.30 mg/dL Final    AST 09/81/1914 24  <=34  U/L Final    ALT 06/05/2023 23  10 - 49 U/L Final    Alkaline Phosphatase 06/05/2023 83  46 - 116 U/L Final     Lab Results   Component Value Date    PSA <0.04 06/05/2023    PSA <0.04 03/27/2023    PSA <0.04 12/14/2022    PSA <0.04 09/21/2022    PSA <0.04 06/13/2022    PSA 0.05 04/24/2022         Orders placed or performed in visit on 06/05/23    Clinic Appointment Request Physician, Injection    Clinic Appointment Request Physician    LAB Appointment Request    LAB Appointment Request           Molecular Pathology  Tumor mutation profiling on 08/01/18  TMB 0 m/Mb  MSS  APC  MTA  PTEN  CDKN2A/B  CIC  TP53    Germline testing [facility] on 09/12/21  Negative    Pathology  05/04/2021  [A] PROSTATE, LEFT BASE:   ACINAR ADENOCARCINOMA, GLEASON 4+3=7  (GG   3), INVOLVING 1 OF 1 CORES, MEASURING 5  MM ( 71%). 80% PATTERN 4;   CRIBRIFORM PATTERN 4 PRESENT    [B] PROSTATE, LEFT MID:   ACINAR ADENOCARCINOMA, GLEASON 4+3=7  (GG 3),   INVOLVING 1 OF 1 CORES, MEASURING 5  MM ( 83%). 80% PATTERN 4;   CRIBRIFORM PATTERN 4 PRESENT    [C] PROSTATE, LEFT APEX:   ACINAR ADENOCARCINOMA, GLEASON 4+3=7  (GG   3), INVOLVING 1 OF 2 CORES, MEASURING 4  MM ( 40%). 70% PATTERN 4;   CRIBRIFORM PATTERN 4 PRESENT    [D] PROSTATE, RIGHT BASE:   NEGATIVE FOR MALIGNANCY.    [E] PROSTATE, RIGHT MID:   NEGATIVE FOR MALIGNANCY.    [F] PROSTATE, RIGHT APEX:   NEGATIVE FOR MALIGNANCY.    [G] PROSTATE, LEFT LATERAL BASE:   ACINAR ADENOCARCINOMA, GLEASON 4+4=8   (GG 4), INVOLVING 1 OF 1 CORES, MEASURING 5  MM ( 50%). PERINEURAL   INVASION PRESENT    [H] PROSTATE, LEFT LATERAL MID:   ACINAR ADENOCARCINOMA, GLEASON 4+5=9   (GG 5), INVOLVING 1 OF 1 CORES, MEASURING 9  MM ( 100%). CRIBRIFORM   PATTERN 4 PRESENT    [I] PROSTATE, LEFT LATERAL APEX:   ACINAR ADENOCARCINOMA, GLEASON 4+3=7   (GG 3), INVOLVING 2 OF 2 CORES, MEASURING 8  MM ( 100%). 80% PATTERN 4;   CRIBRIFORM PATTERN 4 PRESENT; PERINEURAL INVASION PRESENT    [J] PROSTATE, RIGHT LATERAL BASE:   NEGATIVE FOR MALIGNANCY.    [K] PROSTATE, RIGHT LATERAL MID:   NEGATIVE FOR MALIGNANCY.    [L] PROSTATE, RIGHT LATERAL APEX:   NEGATIVE FOR MALIGNANCY.     Imaging results:  CT AP 05/19/2021  LYMPH NODES: Retroperitoneal and pelvic adenopathy with index lesions as follows  --1.5 cm retrocaval lymph node  -- 1.4 cm left common iliac lymph node.  --1.9 cm left external iliac lymph node  --1.8 cm left internal iliac lymph  node  IMPRESSION:  Retroperitoneal and  left pelvic adenopathy, suspicious for metastatic disease.     Heterogeneous appearance of the prostate, consistent with known neoplasm.     Indeterminate lesion in the right posterior 12th rib. Recommend correlation with concurrent bone scan.     Mildly hyperenhancing right renal lesion, suspicious for cortical neoplasm. Recommend correlation with prior imaging if available. If not, MRI of the abdomen with and without contrast may be performed for definitive characterization.  Bone scan 05/19/2021  Focal area of increased radiotracer uptake within the left scapula which could represent osseous metastasis or remote trauma, indeterminate      PET CT Skull Base to Thigh 06/13/2021  -Mild radiotracer uptake within the prostate, likely corresponding to patient's known neoplasm.   -Multiple radiotracer avid retrocaval and left iliac lymph nodes, as described above, concerning for metastatic disease. Tiny right iliac/pelvic sidewall lymph nodes with no significant uptake, indeterminate.   -Radiotracer uptake within the left scapular body, corresponding to increased tracer uptake on recent bone scan, concerning for osseous metastatic disease.    MRI Upper Extremity Non-Joint Left W Wo Contrast  Result Date: 05/02/2022   Impression: 1.Enhancing lytic lesion seen within the scapula compatible with known metastasis which appears overall similar to prior CT allowing for differences in modality. 2.Surrounding intramuscular edema and enhancement and areas of possible muscle necrosis. Findings are favored to represent posttreatment changes in setting of recent radiation therapy. 3.Glenohumeral degenerative changes with small joint effusion and degenerative labral tearing/chondrosis.      DEXA bone scan  FINDINGS  Lumbar Spine   Excluded levels: L4  BMD: 0.963 (g/cm)   T score: -1.0  Lumbar WHO classification: NORMAL BONE MINERAL DENSITY.     Left Hip   Femoral Neck:   BMD: 0.747 (g/cm)   T score: -1.3  Total Hip   BMD: 1.003 (g/cm)   T score: -0.2  Hip WHO classification: LOW BONE MASS.     MRI Prostate W Wo Contrast (07/04/2022)  Impression: Restricted diffusion identified at the right base of the prostate at the region of the proximal right seminal vesicle. Findings are suspicious for locally recurrent prostate carcinoma. Could consider correlation with biopsy or PSMA PET. DynaCAD imaging sent to PACS PI-RADS v2.1 Assessment Categories PI-RADS 1  Very low (clinically significant cancer is highly unlikely to be present) PI-RADS 2  Low (clinically significant cancer is unlikely to be present) PI-RADS 3  Intermediate (the presence of clinically significant cancer is equivocal) PI-RADS 4  High (clinically significant cancer is likely to be present) PI-RADS 5  Very high (clinically significant cancer is highly likely to be present      IR Perc Augment Lumbar 1 Vert Body  Result Date: 11/28/2022   Impression: Vertebral augmentation of the L4 vertebral body Plan: Return to clinic in 3 weeks with L-spine x-ray     MRI Lumbar Spine W Wo Contrast  Result Date: 10/06/2022   Impression: Compression fracture of L4 as described above. Enhancement diffusely involves the vertebral body and there is no evidence of epidural extension/curtain sign to suggest underlying pathologic lesion. No definite metastatic lesions throughout the visualized portions of the spine. Degenerative changes, worst at L5-S1. Mild congenital canal narrowing worsened by facet degenerative changes as well as mild bilateral foraminal narrowing at L4-5.       DEXA Bone Density   Result Date: 03/27/2023  Impression:   Lumbar Spine       Excluded Levels: L4       BMD: 1.017 (g/cm2)         T score: -0.5       Comparison (L1-L4):             Baseline: 5.6% change since baseline comparison study, which is statistically significant.           Recent: 5.6% change since most recent comparison study, which is statistically significant.     Lumbar WHO classification: NORMAL BONE MINERAL DENSITY.  Left Hip          Total Hip           BMD:     0.986 (g/cm2)             T score: -0.3           Comparison:                  Baseline: -1.7% change since baseline comparison study, which is not statistically significant.                Recent: -1.7% change since most recent comparison study, which is not statistically significant.          Femoral Neck:           BMD: 0.733 (g/cm2)           T score: -1.4           Comparison:                 Baseline: -1.9% change since baseline comparison study, which is not statistically significant.               Recent: -1.9% change since most recent comparison study, which is not statistically significant.     Hip WHO classification: LOW BONE MASS       MRI Lumbar Spine W Wo Contrast  Result Date: 04/16/2023  Impression:   -Unchanged pathologic compression fracture of L4 status post vertebral body augmentation. No new fractures or other acute osseous abnormality.-Mild enhancement of the superior and inferior endplates at L5. Metastatic disease not excluded.-Multilevel degenerative changes of the lumbar spine as described in findings.    I attest that I, Alto Denver, personally documented this note while acting as scribe for Maurie Boettcher, MD       Alto Denver, Scribe.  06/05/2023     Documentation assistance provided by Medical Scribe, Alto Denver who was present during the entirety of the visit. I reviewed the note below and validated all of the information provided to ensure accuracy and completeness.      Maurie Boettcher, MD

## 2023-06-08 NOTE — Unmapped (Signed)
Specialty Medication(s): abiraterone    Mr.Eric Good has been dis-enrolled from the [facility] specialty pharmacy services due to  Per Epic note 06/05/23. He is to stop abiraterone .    Additional information provided to the patient: NA    Kermit Balo, [facility]  [facility] Specialty Pharmacist

## 2023-08-07 ENCOUNTER — Ambulatory Visit
Admission: EM | Admit: 2023-08-07 | Discharge: 2023-08-07 | Disposition: A | Discharge status: Home / Self Care | Payer: No Typology Code available for payment source | Attending: Emergency Medicine | Admitting: Emergency Medicine

## 2023-08-07 ENCOUNTER — Encounter: Payer: Self-pay | Admitting: Emergency Medicine

## 2023-08-07 ENCOUNTER — Ambulatory Visit (INDEPENDENT_AMBULATORY_CARE_PROVIDER_SITE_OTHER): Payer: No Typology Code available for payment source

## 2023-08-07 DIAGNOSIS — I498 Other specified cardiac arrhythmias: Secondary | ICD-10-CM

## 2023-08-07 DIAGNOSIS — R319 Hematuria, unspecified: Secondary | ICD-10-CM

## 2023-08-07 DIAGNOSIS — R300 Dysuria: Secondary | ICD-10-CM

## 2023-08-07 LAB — URINALYSIS, W/ REFLEX TO CULTURE (INFECTION SUSPECTED)
Bilirubin Urine: NEGATIVE
Glucose, UA: NEGATIVE mg/dL
Hgb urine dipstick: NEGATIVE
Ketones, ur: NEGATIVE mg/dL
Leukocytes,Ua: NEGATIVE
Nitrite: NEGATIVE
Protein, ur: NEGATIVE mg/dL
Specific Gravity, Urine: 1.01 (ref 1.005–1.030)
pH: 5.5 (ref 5.0–8.0)

## 2023-08-07 NOTE — ED Provider Notes (Signed)
 HPI  SUBJECTIVE:  Juan Huynh. is a 78 y.o. male who presents with 4 days of dysuria, hematuria the first day, none since.  No urinary urgency, frequency, cloudy or odorous urine, nausea, vomiting, fevers.  No abdominal, back, pelvic pain, testicular, perineal pain or swelling.  He states that sometimes he feels as if he is not completely emptying his bladder.  No penile rash or discharge.  He has not been sexually active since 2019.  No aggravating or alleviating factors.  He has not tried anything for his symptoms.  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  Patient has a documented history of ventricular bigeminy that was evaluated by [facility] cardiology on 01/31/2022.  Plan was to have him return on 02/01/2023.  Patient states that he has moved his cardiology care to the [place], had a normal cardiac catheterization in November/December 2024.  He denies chest pain, shortness of breath, weakness, syncope, nausea, diaphoresis.  He states that he feels dizzy described as lightheadedness once in a while, but this has not changed recently.  Past Medical History:  Diagnosis Date   Arthritis    Cancer (HCC)    Prostate Ca.  Metastisized to left scapula and pelvis.   Frequent PVCs    GERD (gastroesophageal reflux disease)     Past Surgical History:  Procedure Laterality Date   COLONOSCOPY WITH PROPOFOL  N/A 07/25/2022   Procedure: COLONOSCOPY WITH PROPOFOL ;  Surgeon: Unk Corinn Skiff, MD;  Location: [facility];  Service: Endoscopy;  Laterality: N/A;    No family history on file.  Social History   Tobacco Use   Smoking status: Former    Types: Pipe, Cigars    Quit date: 05/2021    Years since quitting: 2.2   Smokeless tobacco: Never   Tobacco comments:    2 pipe bowls per day x 10 years; still may have a cigar less than once a month   Vaping Use   Vaping status:  Never Used  Substance Use Topics   Alcohol  use: Not Currently    Alcohol /week: 9.0 standard drinks of alcohol     Types: 9 Standard drinks or equivalent per week    Comment: None since 11/22   Drug use: Never    No current facility-administered medications for this encounter.  Current Outpatient Medications:    aspirin  81 MG chewable tablet, Chew by mouth., Disp: , Rfl:    atorvastatin  (LIPITOR) 40 MG tablet, Take by mouth., Disp: , Rfl:    gabapentin  (NEURONTIN ) 100 MG capsule, Take by mouth., Disp: , Rfl:    losartan  (COZAAR ) 25 MG tablet, Take by mouth., Disp: , Rfl:    metoprolol  succinate (TOPROL -XL) 25 MG 24 hr tablet, Take by mouth., Disp: , Rfl:    abiraterone acetate (ZYTIGA) 250 MG tablet, Take 750 mg by mouth daily., Disp: , Rfl:    ascorbic acid (VITAMIN C) 250 MG CHEW, Chew by mouth., Disp: , Rfl:    calcium  carbonate (TUMS EX) 750 MG chewable tablet, Chew 1 tablet by mouth., Disp: , Rfl:    Carboxymeth-Glyc-Polysorb PF (REFRESH OPTIVE MEGA-3) 0.5-1-0.5 % SOLN, Apply to eye., Disp: , Rfl:    cetirizine (ZYRTEC) 10 MG tablet, TAKE ONE TABLET BY MOUTH  PRN, Disp: , Rfl:    Cholecalciferol 125 MCG (5000 UT) capsule, Take by mouth., Disp: , Rfl:    predniSONE (DELTASONE) 5 MG tablet, Take 5 mg by mouth  daily., Disp: , Rfl:    Propylene Glycol 0.6 % SOLN, Apply to eye as needed., Disp: , Rfl:    SUPER B COMPLEX/C PO, Take by mouth., Disp: , Rfl:    Zinc 50 MG TABS, Take by mouth., Disp: , Rfl:   Allergies  Allergen Reactions   Fluticasone     Nose bleed   Budesonide Nausea Only     ROS  As noted in HPI.   Physical Exam  BP 120/74 (BP Location: Left Arm)   Pulse (!) 39   Temp 98.4 F (36.9 C) (Oral)   Resp 16   SpO2 100%   Constitutional: Well developed, well nourished, no acute distress Eyes: PERRL, EOMI, conjunctiva normal bilaterally HENT: Normocephalic, atraumatic,mucus membranes moist Respiratory: Normal inspiratory effort Cardiovascular: Regular  rhythm GI: Soft, nondistended, normal bowel sounds, nontender, no rebound, no guarding.  No suprapubic, flank tenderness Back: no CVAT GU: Uncircumcised male, testes descended bilaterally.  No testicular, epididymal tenderness, swelling or scrotal erythema.  Chaperone present during exam Rectal: Normal size, nontender, firm prostate. skin: No rash, skin intact Musculoskeletal: No edema, no tenderness, no deformities Neurologic: Alert & oriented x 3, CN III-XII grossly intact, no motor deficits, sensation grossly intact Psychiatric: Speech and behavior appropriate   ED Course   Medications - No data to display  Orders Placed This Encounter  Procedures   Urine Culture    Standing Status:   Standing    Number of Occurrences:   1    Indication:   Dysuria   DG Abd 1 View    Standing Status:   Standing    Number of Occurrences:   1    Reason for Exam (SYMPTOM  OR DIAGNOSIS REQUIRED):   dysuria hematuria r/o nephrolithasis   Urinalysis, w/ Reflex to Culture (Infection Suspected) -Urine, Clean Catch    Standing Status:   Standing    Number of Occurrences:   1    Specimen Source:   Urine, Clean Catch [[id number]]   ED EKG    bradycardia    Standing Status:   Standing    Number of Occurrences:   1    Reason for Exam:   Other (See Comments)   EKG 12-Lead    Standing Status:   Standing    Number of Occurrences:   1   No results found for this or any previous visit (from the past 24 hours).  DG Abd 1 View Result Date: 08/07/2023 CLINICAL DATA:  Dysuria and hematuria for 3 days. Question nephrolithiasis. EXAM: ABDOMEN - 1 VIEW COMPARISON:  CT 05/19/2021. FINDINGS: Two supine views of the abdomen are submitted. No calcifications are seen over the kidneys or expected course of the proximal ureters. Punctate pelvic calcifications are similar to previous CT, likely phleboliths. There is prominent stool throughout the colon. Patient is status post L4 spinal augmentation. No acute osseous findings are  evident. IMPRESSION: No evidence of nephrolithiasis. Prominent stool throughout the colon suggesting constipation. Electronically Signed   By: Elsie Perone M.D.   On: 08/07/2023 15:07    ED Clinical Impression  1. Dysuria   2. Hematuria, unspecified type   3. Ventricular bigeminy      ED Assessment/Plan    Outside records extensively reviewed, as noted in HPI.  Additional medical history obtained.  1.  Dysuria/hematuria.  UA negative for UTI, but with a few bacteria.  Will send this off for culture.  No evidence of prostatitis on exam.  Waiting for urine culture results  prior to initiating antibiotics.  Will check KUB looking for nephrolithiasis.  Reviewed imaging independently.  Multiple radiopaque densities in the bladder.  No appreciable radiopaque densities in the abdomen or kidneys.  Diffuse stool throughout.  Discussed this with patient.  Advised to increase fluid intake, offered Flomax, but because he is largely asymptomatic, he declined.  States he does not like taking medicine unless he absolutely has to.  I feel that this is reasonable.  Also advised increase fluid and fiber intake.   Will contact patient at [phone] if radiology over read differs enough from mine and we need to change management.    Reviewed radiology report.  No evidence of nephrolithiasis.  Positive stool throughout the colon.  Phleboliths in the pelvis, present on previous CT.  see radiology report for full details.  2.  Bradycardia at 39 in triage.  EKG obtained.  EKG: Bigeminy, ventricular rate 78.  Normal axis.  No hypertrophy.  No ischemic changes.  No previous EKG for comparison.  EKG shows ventricular rate of 78.  I believe the monitor was reading the bigeminy.  Patient is currently completely asymptomatic, has a well-documented history of bigeminy and a negative cardiac cath done in November or December 2024.  Patient is to follow-up with his cardiologist at the [facility] ASAP.  Strict ER return  precautions given.  Discussed labs, imaging, MDM, treatment plan, and plan for follow-up with patient Discussed sn/sx that should prompt return to the ED. patient agrees with plan.   No orders of the defined types were placed in this encounter.     *This clinic note was created using Dragon dictation software. Therefore, there may be occasional mistakes despite careful proofreading. ?    Van Knee, MD 08/08/23 1316

## 2023-08-07 NOTE — ED Triage Notes (Signed)
Pt c/o dysuria and hematuria x 3 days  

## 2023-08-07 NOTE — Discharge Instructions (Addendum)
 We will contact you if the radiology overread differs enough from mine and we need to change management.  You have small radiopaque densities in the bladder region, which could be stones.  Make sure you drink plenty of fluids.  You also have a lot of stool throughout.  Increase your fiber intake.  Fiber supplement such as psyllium husks or Metamucil can be helpful.  Go to the ED for the signs and symptoms we discussed-fevers above 100.4, body aches, flulike symptoms, nausea, vomiting, back pain, recurrent blood in your urine, inability to completely empty your bladder  Also give your cardiologist call and let them know that we found you in bigeminy.  Go to the ER for chest pain, shortness of breath, increased dizziness, or passing out.

## 2023-08-08 LAB — URINE CULTURE: Culture: NO GROWTH

## 2023-08-10 ENCOUNTER — Ambulatory Visit: Admit: 2023-08-10 | Discharge: 2023-08-11 | Payer: MEDICARE

## 2023-08-13 ENCOUNTER — Encounter: Payer: Self-pay | Admitting: Emergency Medicine

## 2023-08-21 ENCOUNTER — Other Ambulatory Visit: Payer: Self-pay | Admitting: Student

## 2023-08-21 DIAGNOSIS — R319 Hematuria, unspecified: Secondary | ICD-10-CM

## 2023-08-28 ENCOUNTER — Ambulatory Visit
Admission: RE | Admit: 2023-08-28 | Discharge: 2023-08-28 | Disposition: A | Discharge status: Home / Self Care | Payer: No Typology Code available for payment source | Source: Ambulatory Visit | Attending: Student | Admitting: Student

## 2023-08-28 DIAGNOSIS — R319 Hematuria, unspecified: Secondary | ICD-10-CM | POA: Diagnosis present

## 2023-08-28 MED ORDER — IOHEXOL 300 MG/ML  SOLN
100.0000 mL | Freq: Once | INTRAMUSCULAR | Status: AC | PRN
  Administered 2023-08-28: 100 mL via INTRAVENOUS

## 2023-10-02 IMAGING — CT CT ABD-PELV W/ CM
2 of 5 series · 16 of 46 positions shown, 18 images · IV contrast (APPLIED)
Comparison: None.

CLINICAL DATA: Prostate cancer

EXAM:
CT ABDOMEN AND PELVIS WITH CONTRAST
TECHNIQUE: Multidetector CT imaging of the abdomen and pelvis was performed
using the standard protocol following bolus administration of
intravenous contrast.
CONTRAST:  100mL OMNIPAQUE IOHEXOL 300 MG/ML  SOLN

[Series 2: routine abd/pel with · axial · 0.82mm/px · z∈[-622,-222]mm · 13 of 90 slices shown, 15 images]
[im 5/90  soft-tissue]
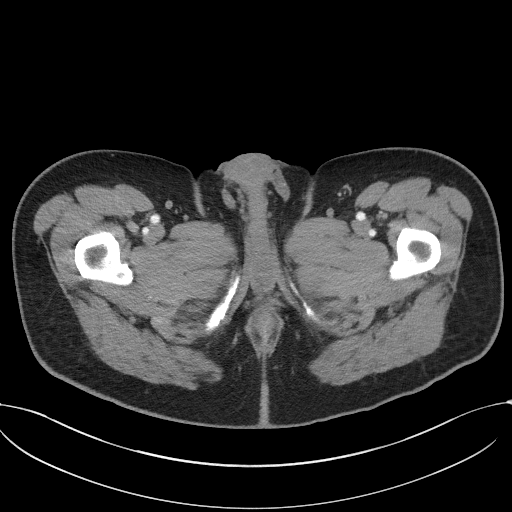
[im 5/90  bone]
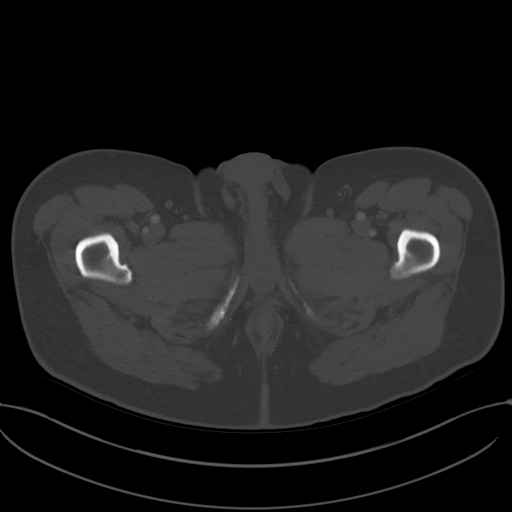
[im 10/90  soft-tissue]
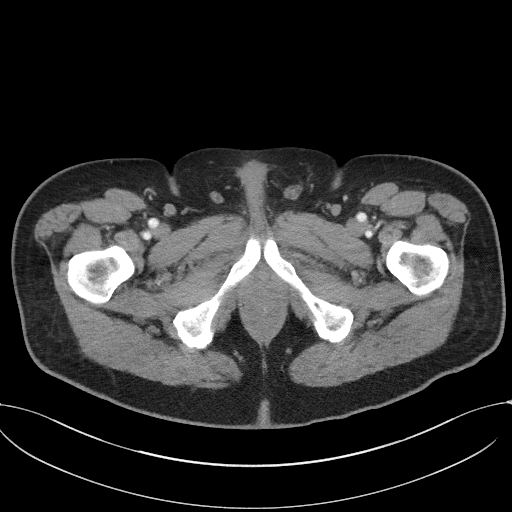
[im 20/90  soft-tissue]
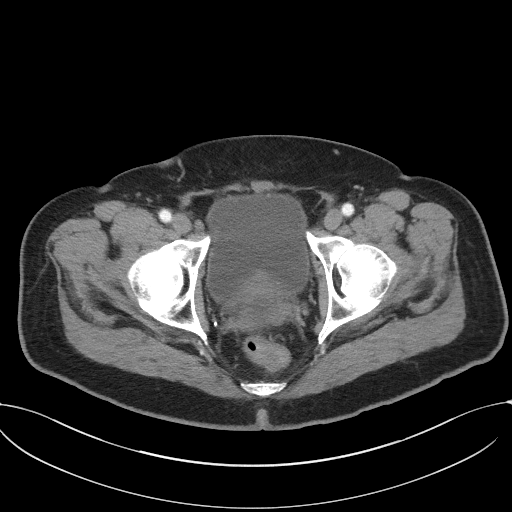
[im 25/90  soft-tissue]
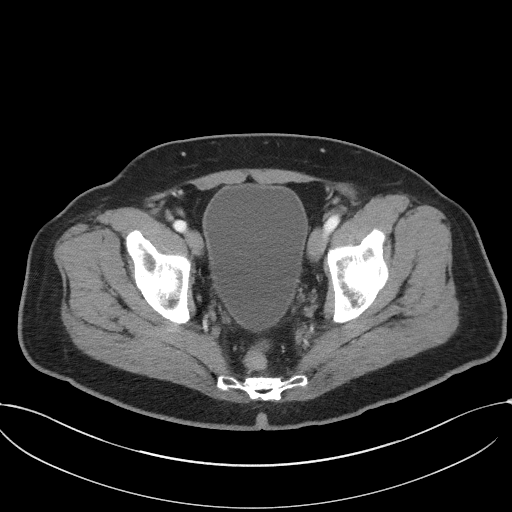
[im 30/90  soft-tissue]
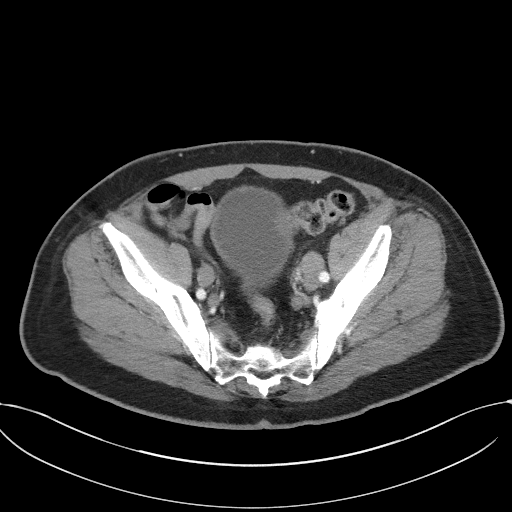
[im 40/90  soft-tissue]
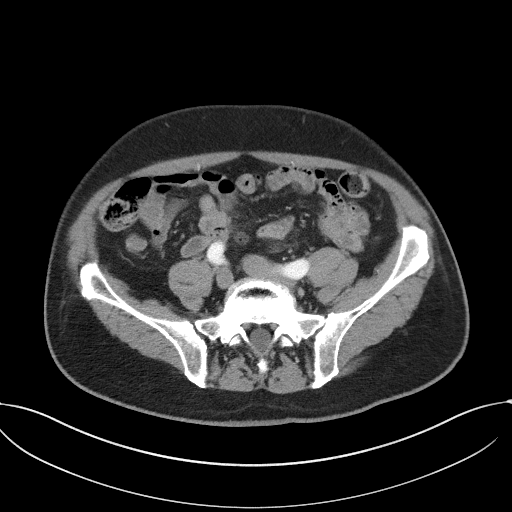
[im 45/90  soft-tissue]
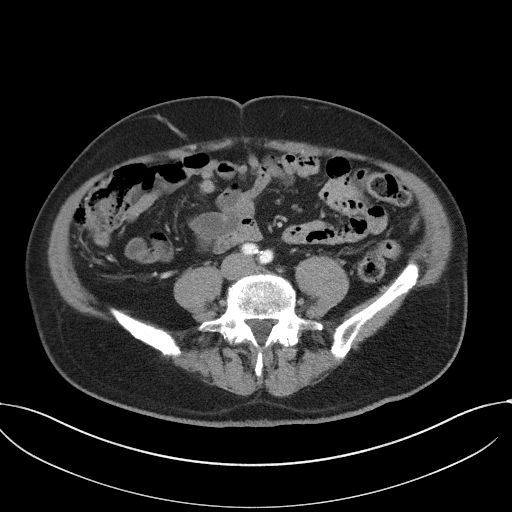
[im 50/90  soft-tissue]
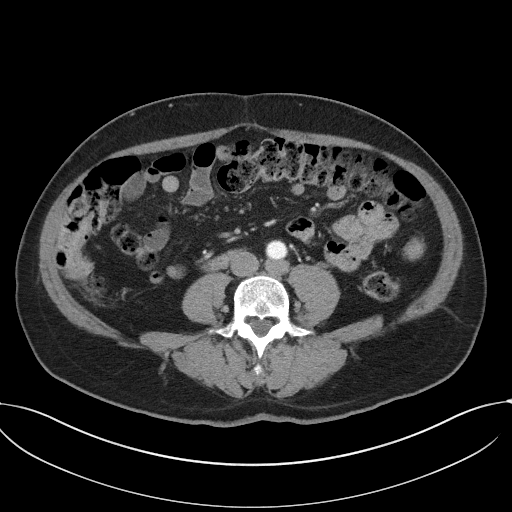
[im 60/90  soft-tissue]
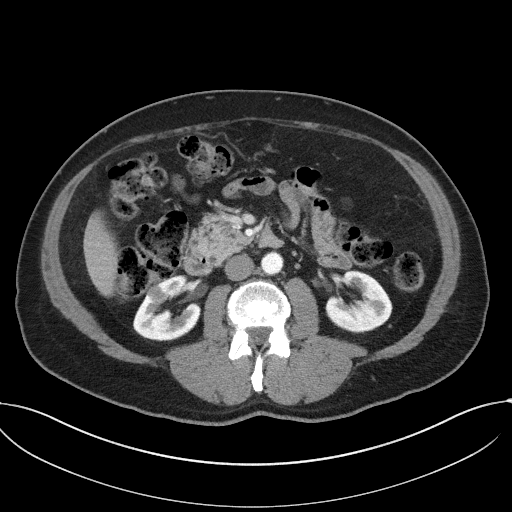
[im 60/90  bone]
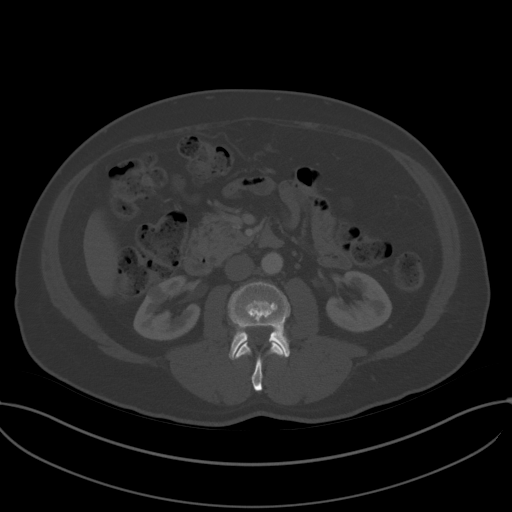
[im 65/90  soft-tissue]
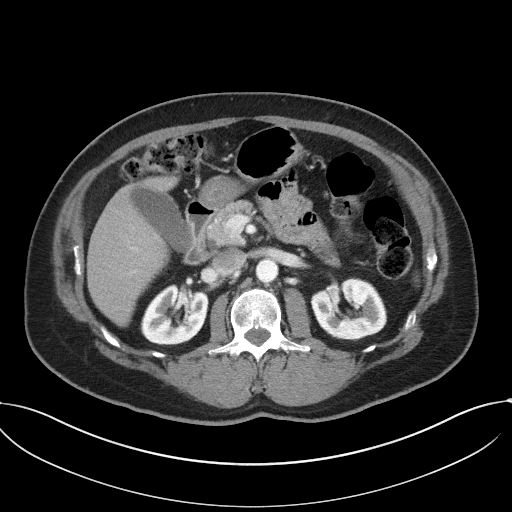
[im 70/90  soft-tissue]
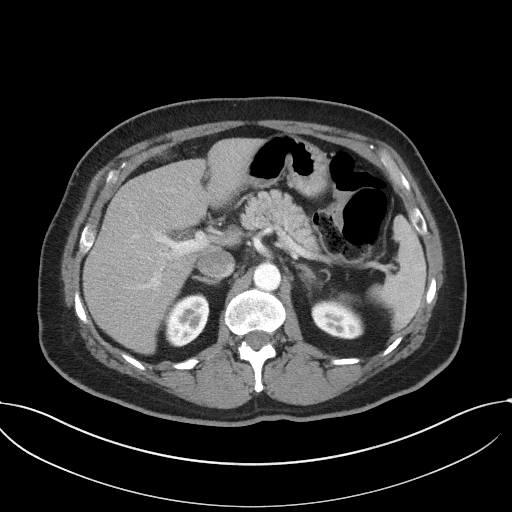
[im 80/90  soft-tissue]
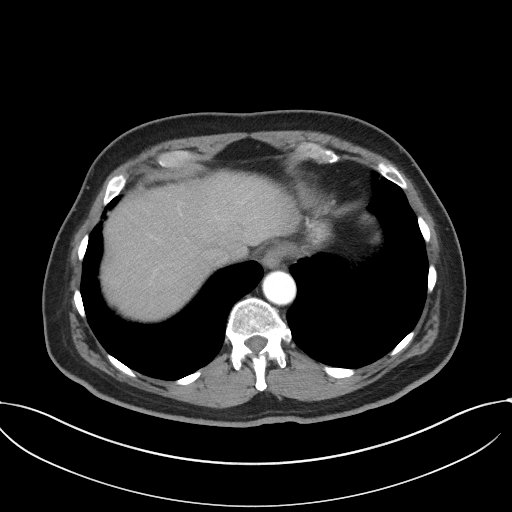
[im 85/90  soft-tissue]
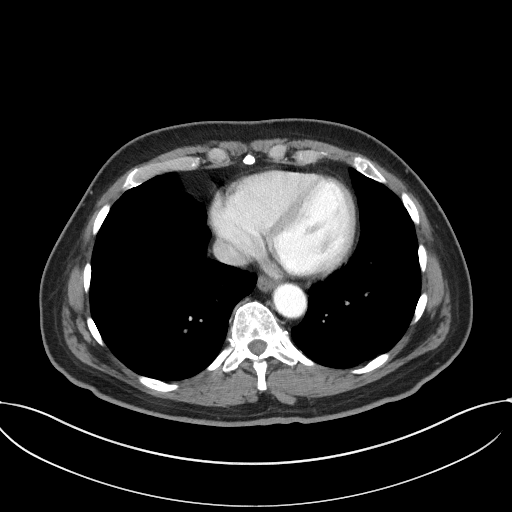

[Series 5: coronal st · coronal · 0.76mm/px · 3 of 89 slices shown]
[im 30/89  soft-tissue]
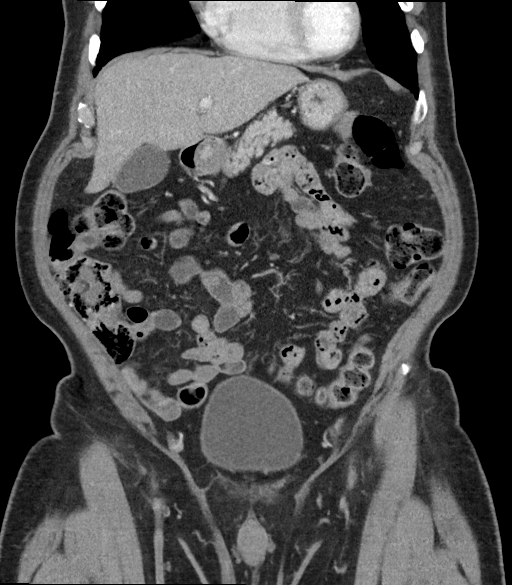
[im 40/89  soft-tissue]
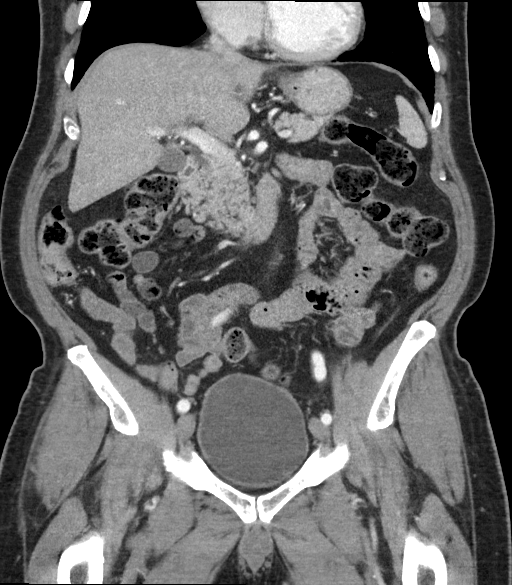
[im 49/89  soft-tissue]
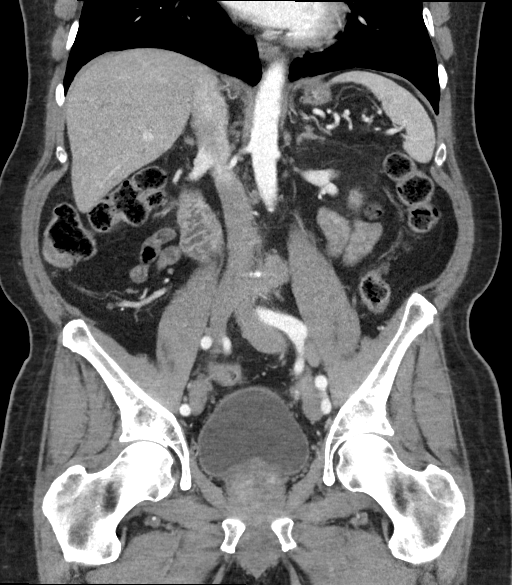

[16 of 46 positions shown; findings below may reference images not displayed]

FINDINGS: Lower chest: No acute abnormality.

Hepatobiliary: No focal liver abnormality is seen. No gallstones,
gallbladder wall thickening, or biliary dilatation.

Pancreas: Unremarkable. No pancreatic ductal dilatation or
surrounding inflammatory changes.

Spleen: Normal in size without focal abnormality.

Adrenals/Urinary Tract: Bilateral adrenal glands are unremarkable.
Low-attenuation lesions of the left kidney which are too small to
completely characterize. Bladder is unremarkable.

Stomach/Bowel: Stomach is within normal limits. Appendix not
visualized. No evidence of bowel wall thickening, distention, or
inflammatory changes.

Vascular/Lymphatic: Mild atherosclerotic disease of the thoracic
aorta. Enlarged left pelvic lymph nodes. Reference left external
iliac lymph node measuring 1.6 cm in short axis on series 2, image
62. enlarged left periaortic lymph node measuring 1.4 cm on series
2, image 42.

Reproductive: Heterogeneous and enlarged prostate.

Other: No abdominal wall hernia or abnormality. No abdominopelvic
ascites.

Musculoskeletal: No aggressive appearing osseous lesions
IMPRESSION: 1. Enlarged left pelvic and left periaortic lymph nodes, concerning
for metastatic disease.
2.  Aortic Atherosclerosis ([id number]).

## 2023-10-04 ENCOUNTER — Ambulatory Visit: Admit: 2023-10-04 | Discharge: 2023-10-05 | Attending: Hematology & Oncology | Primary: Hematology & Oncology

## 2023-10-04 ENCOUNTER — Ambulatory Visit: Admit: 2023-10-04 | Discharge: 2023-10-05

## 2023-10-04 DIAGNOSIS — C778 Secondary and unspecified malignant neoplasm of lymph nodes of multiple regions: Principal | ICD-10-CM

## 2023-10-04 DIAGNOSIS — C61 Malignant neoplasm of prostate: Principal | ICD-10-CM

## 2023-10-04 DIAGNOSIS — C7951 Secondary malignant neoplasm of bone: Principal | ICD-10-CM

## 2023-10-31 ENCOUNTER — Other Ambulatory Visit: Payer: Self-pay

## 2023-10-31 ENCOUNTER — Emergency Department (HOSPITAL_COMMUNITY)

## 2023-10-31 ENCOUNTER — Encounter (HOSPITAL_COMMUNITY): Payer: Self-pay

## 2023-10-31 ENCOUNTER — Observation Stay (HOSPITAL_COMMUNITY)
Admission: EM | Admit: 2023-10-31 | Discharge: 2023-11-02 | Disposition: A | Discharge status: Home / Self Care | Attending: Infectious Diseases | Admitting: Infectious Diseases

## 2023-10-31 DIAGNOSIS — I110 Hypertensive heart disease with heart failure: Secondary | ICD-10-CM | POA: Diagnosis not present

## 2023-10-31 DIAGNOSIS — Z8546 Personal history of malignant neoplasm of prostate: Secondary | ICD-10-CM | POA: Insufficient documentation

## 2023-10-31 DIAGNOSIS — R002 Palpitations: Secondary | ICD-10-CM | POA: Diagnosis present

## 2023-10-31 DIAGNOSIS — R000 Tachycardia, unspecified: Principal | ICD-10-CM | POA: Diagnosis present

## 2023-10-31 DIAGNOSIS — I5020 Unspecified systolic (congestive) heart failure: Secondary | ICD-10-CM | POA: Diagnosis not present

## 2023-10-31 DIAGNOSIS — I4892 Unspecified atrial flutter: Secondary | ICD-10-CM | POA: Diagnosis not present

## 2023-10-31 DIAGNOSIS — I10 Essential (primary) hypertension: Secondary | ICD-10-CM | POA: Diagnosis not present

## 2023-10-31 DIAGNOSIS — K219 Gastro-esophageal reflux disease without esophagitis: Secondary | ICD-10-CM | POA: Diagnosis not present

## 2023-10-31 DIAGNOSIS — Z79899 Other long term (current) drug therapy: Secondary | ICD-10-CM | POA: Diagnosis not present

## 2023-10-31 DIAGNOSIS — Z7982 Long term (current) use of aspirin: Secondary | ICD-10-CM | POA: Diagnosis not present

## 2023-10-31 DIAGNOSIS — Z87891 Personal history of nicotine dependence: Secondary | ICD-10-CM | POA: Diagnosis not present

## 2023-10-31 DIAGNOSIS — R7989 Other specified abnormal findings of blood chemistry: Secondary | ICD-10-CM

## 2023-10-31 DIAGNOSIS — E785 Hyperlipidemia, unspecified: Secondary | ICD-10-CM | POA: Diagnosis present

## 2023-10-31 LAB — COMPREHENSIVE METABOLIC PANEL WITH GFR
ALT: 27 U/L (ref 0–44)
AST: 38 U/L (ref 15–41)
Albumin: 3.7 g/dL (ref 3.5–5.0)
Alkaline Phosphatase: 53 U/L (ref 38–126)
Anion gap: 12 (ref 5–15)
BUN: 15 mg/dL (ref 8–23)
CO2: 19 mmol/L — ABNORMAL LOW (ref 22–32)
Calcium: 9.2 mg/dL (ref 8.9–10.3)
Chloride: 104 mmol/L (ref 98–111)
Creatinine, Ser: 1.16 mg/dL (ref 0.61–1.24)
GFR, Estimated: >60 mL/min (ref >60)
Glucose, Bld: 190 mg/dL — ABNORMAL HIGH (ref 70–99)
Potassium: 3.6 mmol/L (ref 3.5–5.1)
Sodium: 135 mmol/L (ref 135–145)
Total Bilirubin: 1.2 mg/dL (ref 0.0–1.2)
Total Protein: 6 g/dL — ABNORMAL LOW (ref 6.5–8.1)

## 2023-10-31 LAB — MAGNESIUM: Magnesium: 1.7 mg/dL (ref 1.7–2.4)

## 2023-10-31 LAB — CBC WITH DIFFERENTIAL/PLATELET
Abs Immature Granulocytes: 0.05 10*3/uL (ref 0.00–0.07)
Basophils Absolute: 0.1 10*3/uL (ref 0.0–0.1)
Basophils Relative: 1 %
Eosinophils Absolute: 0.1 10*3/uL (ref 0.0–0.5)
Eosinophils Relative: 1 %
HCT: 38.2 % — ABNORMAL LOW (ref 39.0–52.0)
Hemoglobin: 12.7 g/dL — ABNORMAL LOW (ref 13.0–17.0)
Immature Granulocytes: 1 %
Lymphocytes Relative: 9 %
Lymphs Abs: 0.6 10*3/uL — ABNORMAL LOW (ref 0.7–4.0)
MCH: 31.7 pg (ref 26.0–34.0)
MCHC: 33.2 g/dL (ref 30.0–36.0)
MCV: 95.3 fL (ref 80.0–100.0)
Monocytes Absolute: 0.5 10*3/uL (ref 0.1–1.0)
Monocytes Relative: 8 %
Neutro Abs: 5.4 10*3/uL (ref 1.7–7.7)
Neutrophils Relative %: 80 %
Platelets: 177 10*3/uL (ref 150–400)
RBC: 4.01 MIL/uL — ABNORMAL LOW (ref 4.22–5.81)
RDW: 13.3 % (ref 11.5–15.5)
WBC: 6.7 10*3/uL (ref 4.0–10.5)
nRBC: 0 % (ref 0.0–0.2)

## 2023-10-31 LAB — TROPONIN I (HIGH SENSITIVITY)
Troponin I (High Sensitivity): 19 ng/L — ABNORMAL HIGH (ref <18)
Troponin I (High Sensitivity): 46 ng/L — ABNORMAL HIGH (ref <18)

## 2023-10-31 LAB — CK: Total CK: 167 U/L (ref 49–397)

## 2023-10-31 MED ORDER — ASPIRIN 81 MG PO CHEW
81.0000 mg | CHEWABLE_TABLET | Freq: Every day | ORAL | Status: DC
  Administered 2023-11-01: 81 mg via ORAL
  Filled 2023-10-31: qty 1

## 2023-10-31 MED ORDER — SODIUM CHLORIDE 0.9 % IV BOLUS
1000.0000 mL | Freq: Once | INTRAVENOUS | Status: AC
Start: 2023-10-31 — End: 2023-10-31
  Administered 2023-10-31: 1000 mL via INTRAVENOUS

## 2023-10-31 MED ORDER — LIDOCAINE 5 % EX PTCH
1.0000 | MEDICATED_PATCH | CUTANEOUS | Status: DC
  Administered 2023-10-31 – 2023-11-01 (×2): 1 via TRANSDERMAL
  Filled 2023-10-31 (×2): qty 1

## 2023-10-31 MED ORDER — ACETAMINOPHEN 650 MG RE SUPP: 650.0000 mg | Freq: Four times a day (QID) | RECTAL | Status: DC | PRN

## 2023-10-31 MED ORDER — CALCIUM CARBONATE ANTACID 500 MG PO CHEW: 500.0000 mg | CHEWABLE_TABLET | Freq: Two times a day (BID) | ORAL | Status: DC | PRN

## 2023-10-31 MED ORDER — ACETAMINOPHEN 325 MG PO TABS
650.0000 mg | ORAL_TABLET | Freq: Four times a day (QID) | ORAL | Status: DC | PRN
  Administered 2023-11-01: 650 mg via ORAL
  Filled 2023-10-31: qty 2

## 2023-10-31 MED ORDER — METOPROLOL SUCCINATE ER 25 MG PO TB24
25.0000 mg | ORAL_TABLET | Freq: Every day | ORAL | Status: DC
  Administered 2023-11-01 – 2023-11-02 (×2): 25 mg via ORAL
  Filled 2023-10-31 (×3): qty 1

## 2023-10-31 MED ORDER — AMIODARONE HCL IN DEXTROSE 360-4.14 MG/200ML-% IV SOLN
60.0000 mg/h | INTRAVENOUS | Status: DC
  Administered 2023-10-31: 60 mg/h via INTRAVENOUS
  Filled 2023-10-31: qty 200

## 2023-10-31 MED ORDER — AMIODARONE HCL IN DEXTROSE 360-4.14 MG/200ML-% IV SOLN: 30.0000 mg/h | INTRAVENOUS | Status: DC

## 2023-10-31 MED ORDER — ENOXAPARIN SODIUM 40 MG/0.4ML IJ SOSY: 40.0000 mg | PREFILLED_SYRINGE | INTRAMUSCULAR | Status: DC

## 2023-10-31 MED ORDER — ATORVASTATIN CALCIUM 10 MG PO TABS
20.0000 mg | ORAL_TABLET | Freq: Every day | ORAL | Status: DC
  Administered 2023-10-31 – 2023-11-01 (×2): 20 mg via ORAL
  Filled 2023-10-31 (×2): qty 2

## 2023-10-31 MED ORDER — LOSARTAN POTASSIUM 25 MG PO TABS
25.0000 mg | ORAL_TABLET | Freq: Every day | ORAL | Status: DC
  Administered 2023-11-01 – 2023-11-02 (×2): 25 mg via ORAL
  Filled 2023-10-31 (×3): qty 1

## 2023-10-31 MED ORDER — POLYETHYLENE GLYCOL 3350 17 G PO PACK: 17.0000 g | PACK | Freq: Every day | ORAL | Status: DC | PRN

## 2023-10-31 MED ORDER — GABAPENTIN 100 MG PO CAPS
100.0000 mg | ORAL_CAPSULE | Freq: Every day | ORAL | Status: DC
  Administered 2023-10-31 – 2023-11-01 (×2): 100 mg via ORAL
  Filled 2023-10-31 (×2): qty 1
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
## 2023-10-31 NOTE — ED Notes (Signed)
 [facility] called.  Verified RN was ready for patient.  All questions answered.
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
## 2023-10-31 NOTE — Hospital Course (Addendum)
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 tach with heart rate greater than 200.  He received 2 boluses of amiodarone .  Converted to atrial flutter followed by sinus rhythm after his amiodarone .  Presented with minimal chest pain worsened by palpation.  Troponins mildly elevated from 19 to 46 likely secondary to demand ischemia from his tachycardia.  TSH unremarkable.  He was continued on metoprolol  and losartan .  Evaluated by cardiology who increased his beta-blocker dosage.  They also recommended EP consult for possible fibrillator implantation. Echo demonstrated EF of 50 to 55%.  They will recommend medication management with amiodarone  and Eliquis  outpatient, as well as a heart monitor for 2 weeks.  He will also follow-up outpatient with cardiology.  #Hypertension On presentation, patient was normotensive.  His home metoprolol  succinate and losartan  were resumed.   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  Home gabapentin  gabapentin  continued.    #Hyperlipidemia Continued home ASA 81 mg daily and atorvastatin  20 mg daily.

## 2023-11-01 ENCOUNTER — Observation Stay (HOSPITAL_COMMUNITY)

## 2023-11-01 DIAGNOSIS — E785 Hyperlipidemia, unspecified: Secondary | ICD-10-CM | POA: Diagnosis not present

## 2023-11-01 DIAGNOSIS — I4892 Unspecified atrial flutter: Secondary | ICD-10-CM | POA: Diagnosis not present

## 2023-11-01 DIAGNOSIS — I5020 Unspecified systolic (congestive) heart failure: Secondary | ICD-10-CM

## 2023-11-01 DIAGNOSIS — R079 Chest pain, unspecified: Secondary | ICD-10-CM | POA: Diagnosis not present

## 2023-11-01 DIAGNOSIS — I10 Essential (primary) hypertension: Secondary | ICD-10-CM | POA: Diagnosis not present

## 2023-11-01 DIAGNOSIS — R000 Tachycardia, unspecified: Principal | ICD-10-CM

## 2023-11-01 LAB — ECHOCARDIOGRAM COMPLETE
AR max vel: 2.35 cm2
AV Peak grad: 4.8 mmHg
Ao pk vel: 1.1 m/s
Area-P 1/2: 3.6 cm2
Height: 69.016 in
P 1/2 time: 365 ms
S' Lateral: 3.8 cm
Weight: 3152 [oz_av]

## 2023-11-01 LAB — CBC
HCT: 37.4 % — ABNORMAL LOW (ref 39.0–52.0)
Hemoglobin: 12.6 g/dL — ABNORMAL LOW (ref 13.0–17.0)
MCH: 31.3 pg (ref 26.0–34.0)
MCHC: 33.7 g/dL (ref 30.0–36.0)
MCV: 93 fL (ref 80.0–100.0)
Platelets: 184 10*3/uL (ref 150–400)
RBC: 4.02 MIL/uL — ABNORMAL LOW (ref 4.22–5.81)
RDW: 13.5 % (ref 11.5–15.5)
WBC: 5 10*3/uL (ref 4.0–10.5)
nRBC: 0 % (ref 0.0–0.2)

## 2023-11-01 LAB — BASIC METABOLIC PANEL WITH GFR
Anion gap: 8 (ref 5–15)
BUN: 11 mg/dL (ref 8–23)
CO2: 23 mmol/L (ref 22–32)
Calcium: 9.3 mg/dL (ref 8.9–10.3)
Chloride: 109 mmol/L (ref 98–111)
Creatinine, Ser: 0.95 mg/dL (ref 0.61–1.24)
GFR, Estimated: >60 mL/min (ref >60)
Glucose, Bld: 97 mg/dL (ref 70–99)
Potassium: 3.7 mmol/L (ref 3.5–5.1)
Sodium: 140 mmol/L (ref 135–145)

## 2023-11-01 LAB — TSH: TSH: 2.812 u[IU]/mL (ref 0.350–4.500)

## 2023-11-01 MED ORDER — SODIUM CHLORIDE 0.9 % IV SOLN: INTRAVENOUS | Status: DC

## 2023-11-01 MED ORDER — AMIODARONE HCL 200 MG PO TABS
200.0000 mg | ORAL_TABLET | Freq: Two times a day (BID) | ORAL | Status: DC
  Administered 2023-11-01 – 2023-11-02 (×3): 200 mg via ORAL
  Filled 2023-11-01 (×3): qty 1
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
## 2023-11-01 NOTE — Care Management Obs Status (Signed)
 MEDICARE OBSERVATION STATUS NOTIFICATION   Patient Details  Name: Juan Huynh. MRN: [id number] Date of Birth: Jan 23, 1946   Medicare Observation Status Notification Given:  Yes    Janith Melnick 11/01/2023, 2:43 PM

## 2023-11-01 NOTE — TOC CM/SW Note (Signed)
 Transition of Care [facility]) - Inpatient Brief Assessment   Patient Details  Name: Onas Traxler. MRN: [id number] Date of Birth: Jan 04, 1946  [facility] CM/SW Contact:    Jennett Model, RN Phone Number: 11/01/2023, 4:43 PM   Clinical Narrative: From home alone, has PCP and insurance on file, states has no HH services in place at this time or DME at home.  States his sister, Dina Francisco will transport them home at [employer] and she is support system, states gets medications from [facility] in [place] and the [place].    Pta self ambulatory . NCM left message with April  with [facility] of admission.   Transition of Care Asessment: Insurance and Status: Insurance coverage has been reviewed Patient has primary care physician: Yes Home environment has been reviewed: home alone Prior level of function:: indep Prior/Current Home Services: No current home services Social Drivers of Health Review: SDOH reviewed no interventions necessary Readmission risk has been reviewed: Yes Transition of care needs: no transition of care needs at this time
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
## 2023-11-01 NOTE — Progress Notes (Signed)
 Echocardiogram 2D Echocardiogram has been performed.  Juan Huynh 11/01/2023, 12:56 PM

## 2023-11-01 NOTE — Care Management Important Message (Signed)
 Important Message  Patient Details  Name: Juan Huynh. MRN: [id number] Date of Birth: 1946/01/10   Important Message Given:        Janith Melnick 11/01/2023, 2:41 PM
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
## 2023-11-02 ENCOUNTER — Other Ambulatory Visit (HOSPITAL_COMMUNITY): Payer: Self-pay

## 2023-11-02 ENCOUNTER — Inpatient Hospital Stay (HOSPITAL_COMMUNITY): Admit: 2023-11-02 | Discharge: 2023-11-02 | Disposition: A | Discharge status: Home / Self Care | Attending: Student

## 2023-11-02 DIAGNOSIS — K219 Gastro-esophageal reflux disease without esophagitis: Secondary | ICD-10-CM | POA: Diagnosis not present

## 2023-11-02 DIAGNOSIS — I4892 Unspecified atrial flutter: Secondary | ICD-10-CM

## 2023-11-02 DIAGNOSIS — E785 Hyperlipidemia, unspecified: Secondary | ICD-10-CM | POA: Diagnosis not present

## 2023-11-02 DIAGNOSIS — R000 Tachycardia, unspecified: Secondary | ICD-10-CM | POA: Diagnosis not present

## 2023-11-02 LAB — BASIC METABOLIC PANEL WITH GFR
Anion gap: 8 (ref 5–15)
BUN: 16 mg/dL (ref 8–23)
CO2: 23 mmol/L (ref 22–32)
Calcium: 9.1 mg/dL (ref 8.9–10.3)
Chloride: 108 mmol/L (ref 98–111)
Creatinine, Ser: 1.05 mg/dL (ref 0.61–1.24)
GFR, Estimated: >60 mL/min (ref >60)
Glucose, Bld: 104 mg/dL — ABNORMAL HIGH (ref 70–99)
Potassium: 3.9 mmol/L (ref 3.5–5.1)
Sodium: 139 mmol/L (ref 135–145)

## 2023-11-02 LAB — TSH: TSH: 3.002 u[IU]/mL (ref 0.350–4.500)

## 2023-11-02 LAB — MAGNESIUM: Magnesium: 2 mg/dL (ref 1.7–2.4)

## 2023-11-02 MED ORDER — LIDOCAINE 5 % EX PTCH
1.0000 | MEDICATED_PATCH | CUTANEOUS | Status: DC
  Filled 2023-11-02: qty 1

## 2023-11-02 MED ORDER — APIXABAN 5 MG PO TABS
5.0000 mg | ORAL_TABLET | Freq: Two times a day (BID) | ORAL | 3 refills | Status: AC
  Filled 2023-11-02: qty 60, 30d supply, fill #0

## 2023-11-02 MED ORDER — AMIODARONE HCL 200 MG PO TABS
ORAL_TABLET | ORAL | 0 refills | Status: DC
  Filled 2023-11-02: qty 104, 90d supply, fill #0

## 2023-11-02 MED ORDER — APIXABAN 5 MG PO TABS
5.0000 mg | ORAL_TABLET | Freq: Two times a day (BID) | ORAL | Status: DC
  Administered 2023-11-02: 5 mg via ORAL
  Filled 2023-11-02: qty 1

## 2023-11-02 NOTE — TOC Progression Note (Signed)
 Transition of Care (TOC) - Progression Note    Patient Details  Name: Juan Huynh. MRN: [id number] Date of Birth: Aug 21, 1945  Transition of Care [facility]) CM/SW Contact  Ronni Colace, RN Phone Number: 11/02/2023, 9:48 AM  Clinical Narrative:     Received a message from the [place]. The patient receives his care from [facility]. The phone number for any needs is [phone] x [phone]       Expected Discharge Plan and Services                                               Social Determinants of Health (SDOH) Interventions SDOH Screenings   Food Insecurity: No Food Insecurity (10/31/2023)  Housing: Low Risk  (10/31/2023)  Transportation Needs: No Transportation Needs (10/31/2023)  Utilities: Not At Risk (10/31/2023)  Social Connections: Moderately Isolated (10/31/2023)  Tobacco Use: Medium Risk (10/31/2023)    Readmission Risk Interventions     No data to display

## 2023-11-02 NOTE — Plan of Care (Signed)
  Problem: Education: Goal: Knowledge of General Education information will improve Description: Including pain rating scale, medication(s)/side effects and non-pharmacologic comfort measures Outcome: Progressing   Problem: Clinical Measurements: Goal: Ability to maintain clinical measurements within normal limits will improve Outcome: Progressing Goal: Will remain free from infection Outcome: Progressing Goal: Respiratory complications will improve Outcome: Progressing   Problem: Activity: Goal: Risk for activity intolerance will decrease Outcome: Progressing   Problem: Pain Managment: Goal: General experience of comfort will improve and/or be controlled Outcome: Progressing

## 2023-11-02 NOTE — Progress Notes (Signed)
 PHARMACY - ANTICOAGULATION CONSULT NOTE  Pharmacy Consult for apixaban . Indication: atrial fibrillation  Allergies  Allergen Reactions   Budesonide Nausea Only   Fluticasone Other (See Comments)    Nose bleed    Patient Measurements: Height: 5' 9.02" (175.3 cm) Weight: 88.6 kg (195 lb 4.8 oz) IBW/kg (Calculated) : 70.74 HEPARIN DW (KG): 88.8  Vital Signs: Temp: 97.7 F (36.5 C) (05/02 0631) Temp Source: Oral (05/02 0631) BP: 134/86 (05/02 0631) Pulse Rate: 74 (05/02 0631)  Labs: Recent Labs    10/31/23 1634 10/31/23 1828 11/01/23 0243 11/02/23 0324  HGB 12.7*  --  12.6*  --   HCT 38.2*  --  37.4*  --   PLT 177  --  184  --   CREATININE 1.16  --  0.95 1.05  CKTOTAL 167  --   --   --   TROPONINIHS 19* 46*  --   --     Estimated Creatinine Clearance: 65.9 mL/min (by C-G formula based on SCr of 1.05 mg/dL).   Medical History: Past Medical History:  Diagnosis Date   Arthritis    Cancer (HCC)    Prostate Ca.  Metastisized to left scapula and pelvis.   Frequent PVCs    GERD (gastroesophageal reflux disease)     Medications:  Scheduled:   amiodarone   200 mg Oral BID   apixaban   5 mg Oral BID   atorvastatin   20 mg Oral QHS   gabapentin   100 mg Oral QHS   lidocaine   1 patch Transdermal Q24H   losartan   25 mg Oral Daily   metoprolol  succinate  25 mg Oral Daily    Assessment: Patient meets criteria for apixaban  5 mg PO BID dosing:  Age < 75 years of age, body habitus > 60 kg, and creatinine < 1.5 mg%.  Plan:  Apixaban  5 mg PO BID commence in hospital>>continue as outpatient at time of discharge. Patient education will be provided before time of discharge.   Kenda Paula, PharmD, CPP Clinical Pharmacist Practitioner 11/02/2023,8:28 AM

## 2023-11-02 NOTE — Progress Notes (Signed)
 Pharmacist rounding with the [facility]MT[facility] Service. Provided patient education on apixaban  with verbal instruction/patient hand-out provided. Patient and wife given the opportunity to ask and have answered questions regarding apixaban .   Sophia Dustman, PharmD, CPP Clinical Pharmacist Practitioner

## 2023-11-02 NOTE — Progress Notes (Signed)
 DISCHARGE NOTE HOME Juan Huynh. to be discharged Home per MD order. Discussed prescriptions and follow up appointments with the patient. Prescriptions given to patient; medication list explained in detail. Patient verbalized understanding.  Skin clean, dry and intact without evidence of skin break down, no evidence of skin tears noted. IV catheter discontinued intact. Site without signs and symptoms of complications. Dressing and pressure applied. Pt denies pain at the site currently. No complaints noted.  Patient free of lines, drains, and wounds.   An After Visit Summary (AVS) was printed and given to the patient. Patient escorted via wheelchair, and discharged home via private auto.  [name], RN

## 2023-11-02 NOTE — Progress Notes (Signed)
  Patient Name: Juan Huynh. Date of Encounter: 11/02/2023  Primary Cardiologist: None Electrophysiologist: None  Interval Summary   The patient is doing well today.  At this time, the patient denies chest pain, shortness of breath, or any new concerns.  He prefers to hold off on loop due to concerns that [place] will not cover it.   Vital Signs    Vitals:   11/02/23 0136 11/02/23 0500 11/02/23 0600 11/02/23 0631  BP: 127/72   134/86  Pulse: 69   74  Resp: 19 18 16 17   Temp: 97.8 F (36.6 C)   97.7 F (36.5 C)  TempSrc: Oral   Oral  SpO2: 98%   97%  Weight:    88.6 kg  Height:        Intake/Output Summary (Last 24 hours) at 11/02/2023 0844 Last data filed at 11/01/2023 1119 Gross per 24 hour  Intake 54.06 ml  Output --  Net 54.06 ml   Filed Weights   10/31/23 2159 11/01/23 0345 11/02/23 0631  Weight: 89.8 kg 89.4 kg 88.6 kg    Physical Exam    GEN- The patient is well appearing, alert and oriented x 3 today.   Lungs- Clear to ausculation bilaterally, normal work of breathing Cardiac- Regular rate and rhythm, no murmurs, rubs or gallops GI- soft, NT, ND, + BS Extremities- no clubbing or cyanosis. No edema  Telemetry    NSR 60-70s (personally reviewed)  Hospital Course    Juan Huynh. is a 78 y.o. male with a history of HFmrEF, PACs/PVCs, h/o metastatic prostate cancer s/p ADT/XRT, and HTN who is being seen today for the evaluation of WCT at the request of Dr. Alvis Ba.   Assessment & Plan    WCT  By EKGs, most likely represented VT.  No further.  Echo 5/1 with normal EF at 50-55% Pt refuses loop recorder, wants to get [facility] approval first OK for d/c with Zio in place. Will submit prior auth for loop recorder from [place] and plan outpatient implantation.    Atrial fib vs AFL Maintaining NSR. No prior history that he was aware of.  CHA2DS2/VASc is 4 from HTN, HFmrEF, and age.  Will cover with Eliquis  5 mg BID for now. May be able to stop once loop  recorder in place.    HFmrEF Cath in 2024 with no atherosclertoic CAD, Tortuous vessels noted   H/o Metastatic Prostate CA S/p ADT/XRT Currently on "intermittent treatment" only based on good response to prior treatment.   Dr. [name] has seen. OK for d/c from EP perspective with plan as above.   For questions or updates, please contact [facility] Please consult [email or web address] for contact info under Cardiology/STEMI.  Signed, Tylene Galla, PA-C  11/02/2023, 8:44 AM

## 2023-11-03 ENCOUNTER — Other Ambulatory Visit (HOSPITAL_COMMUNITY): Payer: Self-pay

## 2023-11-09 ENCOUNTER — Emergency Department

## 2023-11-09 ENCOUNTER — Telehealth: Payer: Self-pay | Admitting: Student

## 2023-11-09 ENCOUNTER — Encounter: Payer: Self-pay | Admitting: Internal Medicine

## 2023-11-09 ENCOUNTER — Other Ambulatory Visit: Payer: Self-pay

## 2023-11-09 ENCOUNTER — Inpatient Hospital Stay
Admission: EM | Admit: 2023-11-09 | Discharge: 2023-11-15 | DRG: 309 | Disposition: A | Discharge status: Hospice | Attending: Osteopathic Medicine | Admitting: Osteopathic Medicine

## 2023-11-09 DIAGNOSIS — Z8249 Family history of ischemic heart disease and other diseases of the circulatory system: Secondary | ICD-10-CM

## 2023-11-09 DIAGNOSIS — Z7901 Long term (current) use of anticoagulants: Secondary | ICD-10-CM | POA: Diagnosis not present

## 2023-11-09 DIAGNOSIS — K219 Gastro-esophageal reflux disease without esophagitis: Secondary | ICD-10-CM | POA: Diagnosis present

## 2023-11-09 DIAGNOSIS — Z8546 Personal history of malignant neoplasm of prostate: Secondary | ICD-10-CM | POA: Diagnosis not present

## 2023-11-09 DIAGNOSIS — E785 Hyperlipidemia, unspecified: Secondary | ICD-10-CM | POA: Diagnosis present

## 2023-11-09 DIAGNOSIS — Z800 Family history of malignant neoplasm of digestive organs: Secondary | ICD-10-CM | POA: Diagnosis not present

## 2023-11-09 DIAGNOSIS — E876 Hypokalemia: Secondary | ICD-10-CM | POA: Diagnosis present

## 2023-11-09 DIAGNOSIS — Z79899 Other long term (current) drug therapy: Secondary | ICD-10-CM | POA: Diagnosis not present

## 2023-11-09 DIAGNOSIS — C7951 Secondary malignant neoplasm of bone: Secondary | ICD-10-CM | POA: Diagnosis present

## 2023-11-09 DIAGNOSIS — I259 Chronic ischemic heart disease, unspecified: Secondary | ICD-10-CM | POA: Diagnosis not present

## 2023-11-09 DIAGNOSIS — I420 Dilated cardiomyopathy: Secondary | ICD-10-CM

## 2023-11-09 DIAGNOSIS — N179 Acute kidney failure, unspecified: Secondary | ICD-10-CM

## 2023-11-09 DIAGNOSIS — I4892 Unspecified atrial flutter: Secondary | ICD-10-CM | POA: Diagnosis present

## 2023-11-09 DIAGNOSIS — I110 Hypertensive heart disease with heart failure: Secondary | ICD-10-CM | POA: Diagnosis present

## 2023-11-09 DIAGNOSIS — I493 Ventricular premature depolarization: Secondary | ICD-10-CM | POA: Diagnosis present

## 2023-11-09 DIAGNOSIS — I4510 Unspecified right bundle-branch block: Secondary | ICD-10-CM | POA: Diagnosis present

## 2023-11-09 DIAGNOSIS — Z87891 Personal history of nicotine dependence: Secondary | ICD-10-CM | POA: Diagnosis not present

## 2023-11-09 DIAGNOSIS — E663 Overweight: Secondary | ICD-10-CM | POA: Diagnosis present

## 2023-11-09 DIAGNOSIS — I472 Ventricular tachycardia, unspecified: Principal | ICD-10-CM

## 2023-11-09 DIAGNOSIS — I480 Paroxysmal atrial fibrillation: Secondary | ICD-10-CM | POA: Diagnosis present

## 2023-11-09 DIAGNOSIS — G629 Polyneuropathy, unspecified: Secondary | ICD-10-CM | POA: Diagnosis present

## 2023-11-09 DIAGNOSIS — Z7982 Long term (current) use of aspirin: Secondary | ICD-10-CM | POA: Diagnosis not present

## 2023-11-09 DIAGNOSIS — I4891 Unspecified atrial fibrillation: Secondary | ICD-10-CM

## 2023-11-09 DIAGNOSIS — R072 Precordial pain: Secondary | ICD-10-CM | POA: Diagnosis not present

## 2023-11-09 DIAGNOSIS — I959 Hypotension, unspecified: Secondary | ICD-10-CM | POA: Diagnosis not present

## 2023-11-09 DIAGNOSIS — Z888 Allergy status to other drugs, medicaments and biological substances status: Secondary | ICD-10-CM | POA: Diagnosis not present

## 2023-11-09 DIAGNOSIS — R001 Bradycardia, unspecified: Secondary | ICD-10-CM | POA: Diagnosis not present

## 2023-11-09 DIAGNOSIS — I5022 Chronic systolic (congestive) heart failure: Secondary | ICD-10-CM | POA: Diagnosis present

## 2023-11-09 DIAGNOSIS — R000 Tachycardia, unspecified: Secondary | ICD-10-CM

## 2023-11-09 DIAGNOSIS — R079 Chest pain, unspecified: Principal | ICD-10-CM

## 2023-11-09 DIAGNOSIS — Z743 Need for continuous supervision: Secondary | ICD-10-CM | POA: Diagnosis not present

## 2023-11-09 DIAGNOSIS — I4729 Other ventricular tachycardia: Secondary | ICD-10-CM | POA: Diagnosis present

## 2023-11-09 DIAGNOSIS — Z6828 Body mass index (BMI) 28.0-28.9, adult: Secondary | ICD-10-CM

## 2023-11-09 HISTORY — DX: Chronic systolic (congestive) heart failure: I50.22

## 2023-11-09 HISTORY — DX: Chest pain, unspecified: R07.9

## 2023-11-09 HISTORY — DX: Unspecified systolic (congestive) heart failure: I50.20

## 2023-11-09 LAB — CBC WITH DIFFERENTIAL/PLATELET
Abs Immature Granulocytes: 0.04 10*3/uL (ref 0.00–0.07)
Basophils Absolute: 0.1 10*3/uL (ref 0.0–0.1)
Basophils Relative: 1 %
Eosinophils Absolute: 0.1 10*3/uL (ref 0.0–0.5)
Eosinophils Relative: 3 %
HCT: 40.8 % (ref 39.0–52.0)
Hemoglobin: 13.7 g/dL (ref 13.0–17.0)
Immature Granulocytes: 1 %
Lymphocytes Relative: 16 %
Lymphs Abs: 0.9 10*3/uL (ref 0.7–4.0)
MCH: 31.6 pg (ref 26.0–34.0)
MCHC: 33.6 g/dL (ref 30.0–36.0)
MCV: 94.2 fL (ref 80.0–100.0)
Monocytes Absolute: 0.4 10*3/uL (ref 0.1–1.0)
Monocytes Relative: 8 %
Neutro Abs: 4.1 10*3/uL (ref 1.7–7.7)
Neutrophils Relative %: 71 %
Platelets: 217 10*3/uL (ref 150–400)
RBC: 4.33 MIL/uL (ref 4.22–5.81)
RDW: 13.1 % (ref 11.5–15.5)
WBC: 5.6 10*3/uL (ref 4.0–10.5)
nRBC: 0 % (ref 0.0–0.2)

## 2023-11-09 LAB — BASIC METABOLIC PANEL WITH GFR
Anion gap: 8 (ref 5–15)
BUN: 22 mg/dL (ref 8–23)
CO2: 24 mmol/L (ref 22–32)
Calcium: 9.3 mg/dL (ref 8.9–10.3)
Chloride: 102 mmol/L (ref 98–111)
Creatinine, Ser: 1.34 mg/dL — ABNORMAL HIGH (ref 0.61–1.24)
GFR, Estimated: 55 mL/min — ABNORMAL LOW (ref >60)
Glucose, Bld: 139 mg/dL — ABNORMAL HIGH (ref 70–99)
Potassium: 3.5 mmol/L (ref 3.5–5.1)
Sodium: 134 mmol/L — ABNORMAL LOW (ref 135–145)

## 2023-11-09 LAB — TROPONIN I (HIGH SENSITIVITY)
Troponin I (High Sensitivity): 12 ng/L (ref <18)
Troponin I (High Sensitivity): 22 ng/L — ABNORMAL HIGH (ref <18)
Troponin I (High Sensitivity): 28 ng/L — ABNORMAL HIGH (ref <18)

## 2023-11-09 LAB — MAGNESIUM: Magnesium: 2 mg/dL (ref 1.7–2.4)

## 2023-11-09 MED ORDER — POLYETHYLENE GLYCOL 3350 17 G PO PACK: 17.0000 g | PACK | Freq: Every day | ORAL | Status: DC | PRN

## 2023-11-09 MED ORDER — ENOXAPARIN SODIUM 40 MG/0.4ML IJ SOSY
40.0000 mg | PREFILLED_SYRINGE | INTRAMUSCULAR | Status: DC
  Filled 2023-11-09: qty 0.4

## 2023-11-09 MED ORDER — SODIUM CHLORIDE 0.9% FLUSH
3.0000 mL | Freq: Two times a day (BID) | INTRAVENOUS | Status: DC
  Administered 2023-11-09 – 2023-11-15 (×9): 3 mL via INTRAVENOUS

## 2023-11-09 MED ORDER — POLYVINYL ALCOHOL 1.4 % OP SOLN: 2.0000 [drp] | Freq: Two times a day (BID) | OPHTHALMIC | Status: DC | PRN

## 2023-11-09 MED ORDER — ACETAMINOPHEN 650 MG RE SUPP: 650.0000 mg | Freq: Four times a day (QID) | RECTAL | Status: DC | PRN

## 2023-11-09 MED ORDER — METOPROLOL SUCCINATE ER 25 MG PO TB24
25.0000 mg | ORAL_TABLET | Freq: Every day | ORAL | Status: DC
  Filled 2023-11-09: qty 1

## 2023-11-09 MED ORDER — AMIODARONE LOAD VIA INFUSION
150.0000 mg | Freq: Once | INTRAVENOUS | Status: AC
  Administered 2023-11-09: 150 mg via INTRAVENOUS
  Filled 2023-11-09: qty 83.34

## 2023-11-09 MED ORDER — LACTATED RINGERS IV BOLUS
1000.0000 mL | Freq: Once | INTRAVENOUS | Status: AC
  Administered 2023-11-09: 1000 mL via INTRAVENOUS

## 2023-11-09 MED ORDER — ACETAMINOPHEN 325 MG PO TABS
650.0000 mg | ORAL_TABLET | Freq: Four times a day (QID) | ORAL | Status: DC | PRN
  Administered 2023-11-15: 650 mg via ORAL
  Filled 2023-11-09: qty 2

## 2023-11-09 MED ORDER — ASPIRIN 81 MG PO CHEW
81.0000 mg | CHEWABLE_TABLET | Freq: Every day | ORAL | Status: DC
  Administered 2023-11-10 – 2023-11-15 (×6): 81 mg via ORAL
  Filled 2023-11-09 (×6): qty 1

## 2023-11-09 MED ORDER — AMIODARONE HCL IN DEXTROSE 360-4.14 MG/200ML-% IV SOLN
30.0000 mg/h | INTRAVENOUS | Status: DC
  Administered 2023-11-09 – 2023-11-12 (×5): 30 mg/h via INTRAVENOUS
  Filled 2023-11-09 (×4): qty 200

## 2023-11-09 MED ORDER — AMIODARONE HCL IN DEXTROSE 360-4.14 MG/200ML-% IV SOLN
60.0000 mg/h | INTRAVENOUS | Status: AC
  Administered 2023-11-09 (×2): 60 mg/h via INTRAVENOUS
  Filled 2023-11-09 (×2): qty 200

## 2023-11-09 MED ORDER — GABAPENTIN 100 MG PO CAPS
100.0000 mg | ORAL_CAPSULE | Freq: Every day | ORAL | Status: DC
  Administered 2023-11-10 – 2023-11-15 (×6): 100 mg via ORAL
  Filled 2023-11-09 (×6): qty 1

## 2023-11-09 MED ORDER — CARBOXYMETH-GLYC-POLYSORB PF 0.5-1-0.5 % OP SOLN: 2.0000 [drp] | Freq: Two times a day (BID) | OPHTHALMIC | Status: DC | PRN

## 2023-11-09 MED ORDER — ATORVASTATIN CALCIUM 20 MG PO TABS
40.0000 mg | ORAL_TABLET | Freq: Every day | ORAL | Status: DC
Start: 2023-11-09 — End: 2023-11-16
  Administered 2023-11-09 – 2023-11-15 (×7): 40 mg via ORAL
  Filled 2023-11-09 (×7): qty 2

## 2023-11-09 MED ORDER — LOSARTAN POTASSIUM 25 MG PO TABS
25.0000 mg | ORAL_TABLET | Freq: Every day | ORAL | Status: DC
  Administered 2023-11-10 – 2023-11-15 (×6): 25 mg via ORAL
  Filled 2023-11-09 (×6): qty 1

## 2023-11-09 NOTE — Assessment & Plan Note (Signed)
 This diagnosis is as per patient report, continue with gabapentin 

## 2023-11-09 NOTE — Assessment & Plan Note (Addendum)
 This is incidentally noted.  Patient is not reporting any vomiting or diarrhea or inability to tolerate p.o. diet.  Patient is not on any diuretics.  I will check a bladder scan urinalysis sodium and creatinine.  Will will give the patient empirically 1 L of fluid tonight and trend creatinine.  Patient losartan  seems to be chronic.

## 2023-11-09 NOTE — ED Notes (Signed)
 Hendrick Locke, MD notified and at bedside for assessment due to two episodes of vtach on cardiac monitor. No new orders

## 2023-11-09 NOTE — ED Provider Notes (Signed)
 [facility] Provider Note    Event Date/Time   First MD Initiated Contact with Patient 11/09/23 [phone]     (approximate)   History   Chest Pain   HPI  Juan Huynh. is a 78 y.o. male who presents to the emergency department today because of concerns for chest pain and abnormal heart rhythm.  The patient was discharged from [facility] 1 week ago after admission for wide-complex tachycardia and chest pain.  He did have a LifeVest fitted today.  He states that this afternoon he started developing chest pain.  It was going on for little while before the LifeVest alerting him to an abnormal rhythm.  He states he tried hitting the buttons on the LifeVest but no shock was ever delivered.  The chest pain and arrhythmia resolved shortly after EMS arrived.  Since then he has had a couple brief for episodes of chest pain and tachycardia.  He has been taking his amiodarone  that was prescribed to take his dose this morning.  Additionally has been taking Eliquis .     Physical Exam   Triage Vital Signs: ED Triage Vitals  Encounter Vitals Group     BP 11/09/23 1606 (!) 123/97     Systolic BP Percentile --      Diastolic BP Percentile --      Pulse Rate 11/09/23 1606 87     Resp 11/09/23 1606 (!) 25     Temp --      Temp src --      SpO2 11/09/23 1606 99 %     Weight 11/09/23 1607 195 lb (88.5 kg)     Height 11/09/23 1607 5\' 9"  (1.753 m)     Head Circumference --      Peak Flow --      Pain Score 11/09/23 1607 0     Pain Loc --      Pain Education --      Exclude from Growth Chart --     Most recent vital signs: Vitals:   11/09/23 1606  BP: (!) 123/97  Pulse: 87  Resp: (!) 25  SpO2: 99%   General: Awake, alert, oriented. CV:  Good peripheral perfusion. Regular rate and rhythm. Resp:  Normal effort. Lungs clear. Abd:  No distention. Non tender. Other:  Lifevest in place   ED Results / Procedures / Treatments   Labs (all labs ordered are  listed, but only abnormal results are displayed) Labs Reviewed  BASIC METABOLIC PANEL WITH GFR - Abnormal; Notable for the following components:      Result Value   Sodium 134 (*)    Glucose, Bld 139 (*)    Creatinine, Ser 1.34 (*)    GFR, Estimated 55 (*)    All other components within normal limits  CBC WITH DIFFERENTIAL/PLATELET  MAGNESIUM  TROPONIN I (HIGH SENSITIVITY)  TROPONIN I (HIGH SENSITIVITY)     EKG  I, Marylynn Soho, attending physician, personally viewed and interpreted this EKG  EKG Time: 1611 Rate: 79 Rhythm: sinus rhythm with PVC Axis: normal Intervals: qtc 474 QRS: narrow ST changes: no st elevation Impression: abnormal ekg    RADIOLOGY I independently interpreted and visualized the CXR. My interpretation: No pneumonia Radiology interpretation:  IMPRESSION:  No active disease.     PROCEDURES:  Critical Care performed: Yes  CRITICAL CARE Performed by: Marylynn Soho   Total critical care time: 30 minutes  Critical care time was exclusive of separately billable  procedures and treating other patients.  Critical care was necessary to treat or prevent imminent or life-threatening deterioration.  Critical care was time spent personally by me on the following activities: development of treatment plan with patient and/or surrogate as well as nursing, discussions with consultants, evaluation of patient's response to treatment, examination of patient, obtaining history from patient or surrogate, ordering and performing treatments and interventions, ordering and review of laboratory studies, ordering and review of radiographic studies, pulse oximetry and re-evaluation of patient's condition.   Procedures    MEDICATIONS ORDERED IN ED: Medications - No data to display   IMPRESSION / MDM / ASSESSMENT AND PLAN / ED COURSE  I reviewed the triage vital signs and the nursing notes.                              Differential diagnosis includes, but  is not limited to, arrhythmia, ACS, pneumonia, pneumothorax  Patient's presentation is most consistent with acute presentation with potential threat to life or bodily function.   The patient is on the cardiac monitor to evaluate for evidence of arrhythmia and/or significant heart rate changes.  Patient presented to the emergency department today because of concerns for chest pain.  Patient did have recent hospitalization for chest pain and wide-complex tachycardia.  Upon EMS arrival the patient did have a wide-complex tachycardia.  It did convert on its own prior to arrival in the emergency department.  Patient's EKG here shows normal sinus rhythm.  Blood work without concerning electrolyte abnormality.  However while here on the cardiac monitor patient did have a couple brief runs of a wide-complex tachycardia.  Discussed with Dr. Julane Ny with cardiology who recommended starting IV amiodarone . Discussed with Dr. Cleda Curly with the hospitalist service who will evaluate for admission.    FINAL CLINICAL IMPRESSION(S) / ED DIAGNOSES   Final diagnoses:  Chest pain, unspecified type  Wide-complex tachycardia        Rx / DC Orders   ED Discharge Orders     None        Note:  This document was prepared using Dragon voice recognition software and may include unintentional dictation errors.    Marylynn Soho, MD 11/09/23 [phone]

## 2023-11-09 NOTE — ED Triage Notes (Signed)
 Pt BIB [facility] from home c/o midsternal non radiating chest pain. Pt has a heart monitor that was placed Friday and a lifevest that was placed this AM. Pt had a run of vtach in the 180's but subsided to  NSR with multifocal PVC'S. Chest pain resolved once EMS was on scene. Pt took 324mg  aspirin  at home.  124/80 99 RA

## 2023-11-09 NOTE — Telephone Encounter (Signed)
   Cardiac Monitor Alert  Date of alert:  11/09/2023   Patient Name: Juan Huynh.  DOB: 08-31-1945  MRN: [id number]   [facility] Cardiologist: None  [facility] EP:  None    Monitor Information: Long Term Monitor-Live Telemetry [ZioAT]  Reason:  Aflutter, Wide-complex tachycardia  Ordering provider:  PA Tillery    Alert Atrial Fibrillation/Flutter This is the 1st alert for this rhythm.  The patient has no hx of Atrial Fibrillation/Flutter.   Received call from [place] with [employer]:   -Patient triggered event at 2:57 PM of rapid Afib   -Average heart rate 186   -first documentation of Afib   -Spoke with patients neighbor, patient en route to hospital    Confirm that the anticoagulant listed below is correct. If not update the chart   :1}  Anticoagulation medication as of 11/09/2023           apixaban  (ELIQUIS ) 5 MG TABS tablet Take 1 tablet (5 mg total) by mouth 2 (two) times daily.       Next Cardiology Appointment   Date:  12/05/23  Provider:  PA Tillery   The patient was contacted today.  Patient stated he is in the Ambulance on his way to [facility] hospital  Post discharge    Hilton Lucky, RN  11/09/2023 3:43 PM

## 2023-11-09 NOTE — Telephone Encounter (Signed)
 Received Critical Alert regarding pt who is currently in the ED.  Alert for 3:02 pm AF HR 182-190 and 3:32 pm HR 149-191.

## 2023-11-09 NOTE — Assessment & Plan Note (Signed)
 Per uro note in 11/18//853 :   78 year old healthy male with elevated PSA of 49 who underwent a prostate biopsy on 05/04/2021 showing high risk prostate cancer including Gleason score 4+5= 9 with 100% core involvement.   Staging imaging with CT and bone scan showed a enlarged left pelvic node, periaortic node consistent with metastatic disease, as well as a scapular lesion on bone scan that may represent prior injury versus metastatic disease.   We discussed his new diagnosis of metastatic prostate cancer and need for referral to oncology  I do not see any onc notes in chart.Juan Huynh

## 2023-11-09 NOTE — Assessment & Plan Note (Signed)
 This was diagnosed late last month.  Rate controlled on metoprolol  XL succinate 25 daily.  Continue with same.  At this time I will hold off on restarting the patient Eliquis  till we have a definite plan from cardiology to not do any procedures.

## 2023-11-09 NOTE — H&P (Signed)
 History and Physical    Patient: Juan Huynh. BJY:[id number] DOB: 09-Jan-1946 DOA: 11/09/2023 DOS: the patient was seen and examined on 11/09/2023 [facility]CP: [facility]  Patient coming from: Home  Chief Complaint:  Chief Complaint  Patient presents with   Chest Pain   HPI: Juan Huynh. is a 78 y.o. male with medical history significant of Wide-complex tachycardia that was initially diagnosed on or around October 31, 2023.  At that time patient had received amiodarone  and also diagnosed with new onset atrial flutter.  Patient was started on beta-blockers and losartan .  Patient was discharged with external automatic cardio defibrillator device EF was 50 to 55%.  Patient was in his usual state of health till around 4 PM this afternoon when patient reports new onset of nonradiating anterior chest pain aching.  Without any aggravating or relieving factor, present at rest lasted about 10 to 12 minutes.  Further patient's LifeVest alerted him that it had "taken action ".  Although patient does not report feeling anything.  EMS was called by patient who did an EKG and diagnosed patient with wide-complex tachycardia with heart rate in the 180s.  It subsequently self reverted to sinus rhythm.  And patient has been in sinus rhythm in the ER. See media section (EMS run sheet) for rhythm strips of event.  Patient denies any fever vomiting diarrhea rash on his skin shortness of breath palpitation or loss of consciousness.  Case has been discussed with cardiology by ER attending, patient has been started on amiodarone  bolus.  Patient is currently totally asymptomatic.  Medical evaluation is sought   Review of Systems: As mentioned in the history of present illness. All other systems reviewed and are negative. Past Medical History:  Diagnosis Date   Arthritis    Cancer (HCC)    Prostate Ca.  Metastisized to left scapula and pelvis.   Frequent PVCs    GERD (gastroesophageal reflux disease)     Past Surgical History:  Procedure Laterality Date   COLONOSCOPY WITH PROPOFOL  N/A 07/25/2022   Procedure: COLONOSCOPY WITH PROPOFOL ;  Surgeon: Selena Daily, MD;  [facility];  Service: Endoscopy;  Laterality: N/A;   Social History:  reports that he quit smoking about 2 years ago. His smoking use included pipe and cigars. He has never used smokeless tobacco. He reports that he does not currently use alcohol after a past usage of about 9.0 standard drinks of alcohol per week. He reports that he does not use drugs.  Allergies  Allergen Reactions   Budesonide Nausea Only   Fluticasone Other (See Comments)    Nose bleed    Family History  Problem Relation Age of Onset   Heart failure Father    Colon cancer Father     Prior to Admission medications   Medication Sig Start Date End Date Taking? Authorizing Provider  amiodarone  (PACERONE ) 200 MG tablet Take 1 tablet (200 mg total) by mouth 2 (two) times daily for 14 days, THEN 1 tablet (200 mg total) daily. 11/02/23 01/31/24 Yes Tillery, Dellia Ferguson, PA-C  apixaban  (ELIQUIS ) 5 MG TABS tablet Take 1 tablet (5 mg total) by mouth 2 (two) times daily. 11/02/23  Yes Tylene Galla, PA-C  aspirin  81 MG chewable tablet Chew by mouth. 12/25/22  Yes [provider]  atorvastatin  (LIPITOR) 40 MG tablet Take by mouth. 03/19/23  Yes [provider]  Carboxymeth-Glyc-Polysorb PF (REFRESH OPTIVE MEGA-3) 0.5-1-0.5 % SOLN Apply 2-3 drops to eye 2 (two)  times daily as needed.   Yes [provider]  cetirizine (ZYRTEC) 10 MG tablet TAKE ONE TABLET BY MOUTH  PRN 04/29/21  Yes [provider]  gabapentin  (NEURONTIN ) 100 MG capsule Take 100 mg by mouth at bedtime. 04/25/23  Yes [provider]  losartan  (COZAAR ) 25 MG tablet Take 25 mg by mouth daily. 12/25/22  Yes [provider]  metoprolol  succinate (TOPROL -XL) 25 MG 24 hr tablet Take 25 mg by mouth daily. 07/13/23  Yes  [provider]  Multiple Vitamin (MULTIVITAMIN) tablet Take 1 tablet by mouth daily.   Yes [provider]  acetaminophen  (TYLENOL ) 650 MG CR tablet Take 1,300 mg by mouth every 8 (eight) hours as needed for pain. Patient not taking: Reported on 11/09/2023    [provider]  calcium  carbonate (TUMS EX) 750 MG chewable tablet Chew 1 tablet by mouth 2 (two) times daily as needed for heartburn. Patient not taking: Reported on 11/09/2023 10/02/21   [provider]    Physical Exam: Vitals:   11/09/23 1607 11/09/23 1730 11/09/23 1800 11/09/23 1945  BP:  113/67 120/69 115/76  Pulse:  63 63 (!) 59  Resp:  (!) 24 (!) 21 18  Temp:    98.2 F (36.8 C)  TempSrc:    Oral  SpO2:  99% 98% 100%  Weight: 88.5 kg   88.5 kg  Height: 5\' 9"  (1.753 m)   5\' 9"  (1.753 m)   General: Patient is alert and awake appears to be in no distress gives a fully coherent account of his symptoms Respiratory exam: Bilateral intravesicular Cardiovascular exam S1-S2 normal Abdomen all quadrants soft nontender Extremities warm without edema Patient is wearing his external cardiac defibrillator device. Data Reviewed:  Labs on Admission:  Results for orders placed or performed during the hospital encounter of 11/09/23 (from the past 24 hours)  CBC with Differential     Status: None   Collection Time: 11/09/23  4:11 PM  Result Value Ref Range   WBC 5.6 4.0 - 10.5 K/uL   RBC 4.33 4.22 - 5.81 MIL/uL   Hemoglobin 13.7 13.0 - 17.0 g/dL   HCT 40.9 81.1 - 91.4 %   MCV 94.2 80.0 - 100.0 fL   MCH 31.6 26.0 - 34.0 pg   MCHC 33.6 30.0 - 36.0 g/dL   RDW 78.2 95.6 - 21.3 %   Platelets 217 150 - 400 K/uL   nRBC 0.0 0.0 - 0.2 %   Neutrophils Relative % 71 %   Neutro Abs 4.1 1.7 - 7.7 K/uL   Lymphocytes Relative 16 %   Lymphs Abs 0.9 0.7 - 4.0 K/uL   Monocytes Relative 8 %   Monocytes Absolute 0.4 0.1 - 1.0 K/uL   Eosinophils Relative 3 %   Eosinophils Absolute 0.1 0.0 - 0.5 K/uL    Basophils Relative 1 %   Basophils Absolute 0.1 0.0 - 0.1 K/uL   Immature Granulocytes 1 %   Abs Immature Granulocytes 0.04 0.00 - 0.07 K/uL  Basic metabolic panel     Status: Abnormal   Collection Time: 11/09/23  4:11 PM  Result Value Ref Range   Sodium 134 (L) 135 - 145 mmol/L   Potassium 3.5 3.5 - 5.1 mmol/L   Chloride 102 98 - 111 mmol/L   CO2 24 22 - 32 mmol/L   Glucose, Bld 139 (H) 70 - 99 mg/dL   BUN 22 8 - 23 mg/dL   Creatinine, Ser 0.86 (H) 0.61 - 1.24 mg/dL  Calcium  9.3 8.9 - 10.3 mg/dL   GFR, Estimated 55 (L) >60 mL/min   Anion gap 8 5 - 15  Troponin I (High Sensitivity)     Status: None   Collection Time: 11/09/23  4:11 PM  Result Value Ref Range   Troponin I (High Sensitivity) 12 <18 ng/L  Magnesium     Status: None   Collection Time: 11/09/23  6:01 PM  Result Value Ref Range   Magnesium 2.0 1.7 - 2.4 mg/dL  Troponin I (High Sensitivity)     Status: Abnormal   Collection Time: 11/09/23  6:01 PM  Result Value Ref Range   Troponin I (High Sensitivity) 22 (H) <18 ng/L   Basic Metabolic Panel: Recent Labs  Lab 11/09/23 1611 11/09/23 1801  NA 134*  --   K 3.5  --   CL 102  --   CO2 24  --   GLUCOSE 139*  --   BUN 22  --   CREATININE 1.34*  --   CALCIUM  9.3  --   MG  --  2.0   Liver Function Tests: No results for input(s): "AST", "ALT", "ALKPHOS", "BILITOT", "PROT", "ALBUMIN" in the last 168 hours. No results for input(s): "LIPASE", "AMYLASE" in the last 168 hours. No results for input(s): "AMMONIA" in the last 168 hours. CBC: Recent Labs  Lab 11/09/23 1611  WBC 5.6  NEUTROABS 4.1  HGB 13.7  HCT 40.8  MCV 94.2  PLT 217   Cardiac Enzymes: Recent Labs  Lab 11/09/23 1611 11/09/23 1801  TROPONINIHS 12 22*    BNP (last 3 results) No results for input(s): "PROBNP" in the last 8760 hours. CBG: No results for input(s): "GLUCAP" in the last 168 hours.  Radiological Exams on Admission:  DG Chest Portable 1 View Result Date: 11/09/2023 CLINICAL  DATA:  Chest pain. EXAM: PORTABLE CHEST 1 VIEW COMPARISON:  October 31, 2023. FINDINGS: Stable cardiomediastinal silhouette. Both lungs are clear. The visualized skeletal structures are unremarkable. IMPRESSION: No active disease. Electronically Signed   By: Rosalene Colon M.D.   On: 11/09/2023 18:01     EKG: Independently reviewed. NSR in ER  No intake/output data recorded. No intake/output data recorded.     Assessment and Plan: * Chest pain See HPI for full details, chest x-ray is not actionable.  Chest pain occurred at rest and lasted for 12 minutes and self resolved.  Seems to have occurred in close association with ventricular tachycardia.  Patient is already anticoagulated and therefore weakness thromboembolism is unlikely.  Patient has had no trouble swallowing or eating or drinking.  With regards to the ventricular tachycardia rhythm strip shows irregular QRS complexes slightly more wider than his baseline QRS complexes.  Concern is for atrial fibrillation with aberrant conduction symptomatic.  Of course cannot rule out and SVT.  Which may be symptomatic.  Cardiology has already been engaged.  Troponin 22 elevation is at this time not actionable.  I will trend this further.  Cardiology input pending.  Look forward to possible external defibrillator interrogation and further management accordingly thank you.  AKI (acute kidney injury) (HCC) This is incidentally noted.  Patient is not reporting any vomiting or diarrhea or inability to tolerate p.o. diet.  Patient is not on any diuretics.  I will check a bladder scan urinalysis sodium and creatinine.  Will will give the patient empirically 1 L of fluid tonight and trend creatinine.  Patient losartan  seems to be chronic.  Neuropathy This diagnosis is as per  patient report, continue with gabapentin   A-fib [facility]) This was diagnosed late last month.  Rate controlled on metoprolol  XL succinate 25 daily.  Continue with same.  At this time I will  hold off on restarting the patient Eliquis  till we have a definite plan from cardiology to not do any procedures.  Malignant neoplasm metastatic to bone [facility]) Per uro note in 11/18//2852 :   78 year old healthy male with elevated PSA of 49 who underwent a prostate biopsy on 05/04/2021 showing high risk prostate cancer including Gleason score 4+5= 9 with 100% core involvement.   Staging imaging with CT and bone scan showed a enlarged left pelvic node, periaortic node consistent with metastatic disease, as well as a scapular lesion on bone scan that may represent prior injury versus metastatic disease.   We discussed his new diagnosis of metastatic prostate cancer and need for referral to oncology  I do not see any onc notes in chart..  Hyperlipidemia Continue with atorvastatin  and aspirin    DVT ppx - resume apixaban  once cleard by cardio.   Advance Care Planning:   Code Status: Full Code   Consults: cardiology.  Family Communication: per pateint.  Severity of Illness: The appropriate patient status for this patient is OBSERVATION. Observation status is judged to be reasonable and necessary in order to provide the required intensity of service to ensure the patient's safety. The patient's presenting symptoms, physical exam findings, and initial radiographic and laboratory data in the context of their medical condition is felt to place them at decreased risk for further clinical deterioration. Furthermore, it is anticipated that the patient will be medically stable for discharge from the hospital within 2 midnights of admission.   Author: Bennie Brave, MD 11/09/2023 8:26 PM  For on call review [email or web address].

## 2023-11-09 NOTE — Assessment & Plan Note (Signed)
 Continue with atorvastatin and aspirin

## 2023-11-09 NOTE — Telephone Encounter (Signed)
 Juan Huynh - irhythm calling to give heart monitor result

## 2023-11-09 NOTE — Assessment & Plan Note (Addendum)
 See HPI for full details, chest x-ray is not actionable.  Chest pain occurred at rest and lasted for 12 minutes and self resolved.  Seems to have occurred in close association with ventricular tachycardia.  Patient is already anticoagulated and therefore weakness thromboembolism is unlikely.  Patient has had no trouble swallowing or eating or drinking.  With regards to the ventricular tachycardia rhythm strip shows irregular QRS complexes slightly more wider than his baseline QRS complexes.  Concern is for atrial fibrillation with aberrant conduction symptomatic.  Of course cannot rule out and SVT.  Which may be symptomatic.  Cardiology has already been engaged.  Troponin 22 elevation is at this time not actionable.  I will trend this further.  Cardiology input pending.  Look forward to possible external defibrillator interrogation and further management accordingly thank you.

## 2023-11-10 ENCOUNTER — Encounter: Payer: Self-pay | Admitting: Internal Medicine

## 2023-11-10 DIAGNOSIS — I420 Dilated cardiomyopathy: Secondary | ICD-10-CM

## 2023-11-10 DIAGNOSIS — R000 Tachycardia, unspecified: Secondary | ICD-10-CM | POA: Diagnosis not present

## 2023-11-10 DIAGNOSIS — R079 Chest pain, unspecified: Secondary | ICD-10-CM | POA: Diagnosis not present

## 2023-11-10 DIAGNOSIS — I472 Ventricular tachycardia, unspecified: Secondary | ICD-10-CM | POA: Diagnosis not present

## 2023-11-10 DIAGNOSIS — E876 Hypokalemia: Secondary | ICD-10-CM

## 2023-11-10 LAB — BASIC METABOLIC PANEL WITH GFR
Anion gap: 7 (ref 5–15)
BUN: 19 mg/dL (ref 8–23)
CO2: 24 mmol/L (ref 22–32)
Calcium: 8.9 mg/dL (ref 8.9–10.3)
Chloride: 110 mmol/L (ref 98–111)
Creatinine, Ser: 1.02 mg/dL (ref 0.61–1.24)
GFR, Estimated: >60 mL/min (ref >60)
Glucose, Bld: 99 mg/dL (ref 70–99)
Potassium: 3.9 mmol/L (ref 3.5–5.1)
Sodium: 140 mmol/L (ref 135–145)

## 2023-11-10 LAB — CBC
HCT: 37.2 % — ABNORMAL LOW (ref 39.0–52.0)
Hemoglobin: 12.5 g/dL — ABNORMAL LOW (ref 13.0–17.0)
MCH: 31.5 pg (ref 26.0–34.0)
MCHC: 33.6 g/dL (ref 30.0–36.0)
MCV: 93.7 fL (ref 80.0–100.0)
Platelets: 190 10*3/uL (ref 150–400)
RBC: 3.97 MIL/uL — ABNORMAL LOW (ref 4.22–5.81)
RDW: 13.1 % (ref 11.5–15.5)
WBC: 5.9 10*3/uL (ref 4.0–10.5)
nRBC: 0 % (ref 0.0–0.2)

## 2023-11-10 LAB — HEMOGLOBIN A1C
Hgb A1c MFr Bld: 5.7 % — ABNORMAL HIGH (ref 4.8–5.6)
Mean Plasma Glucose: 116.89 mg/dL

## 2023-11-10 LAB — PROTIME-INR
INR: 1.1 (ref 0.8–1.2)
Prothrombin Time: 14.4 s (ref 11.4–15.2)

## 2023-11-10 LAB — APTT: aPTT: 32 s (ref 24–36)

## 2023-11-10 MED ORDER — METOPROLOL SUCCINATE ER 25 MG PO TB24
12.5000 mg | ORAL_TABLET | Freq: Every day | ORAL | Status: DC
  Administered 2023-11-10 – 2023-11-13 (×3): 12.5 mg via ORAL
  Filled 2023-11-10 (×3): qty 1

## 2023-11-10 MED ORDER — POTASSIUM CHLORIDE CRYS ER 20 MEQ PO TBCR
40.0000 meq | EXTENDED_RELEASE_TABLET | Freq: Once | ORAL | Status: AC
  Administered 2023-11-10: 40 meq via ORAL
  Filled 2023-11-10: qty 2
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
## 2023-11-10 NOTE — Progress Notes (Signed)
 Progress Note   Patient: Juan Huynh. ZOX:[id number] DOB: 1946-04-28 DOA: 11/09/2023     1 DOS: the patient was seen and examined on 11/10/2023   Brief hospital course: From HPI "Juan Huynh. is a 79 y.o. male with medical history significant of Wide-complex tachycardia that was initially diagnosed on or around October 31, 2023.  At that time patient had received amiodarone  and also diagnosed with new onset atrial flutter.  Patient was started on beta-blockers and losartan .  Patient was discharged with external automatic cardio defibrillator device EF was 50 to 55%.   Patient was in his usual state of health till around 4 PM this afternoon when patient reports new onset of nonradiating anterior chest pain aching.  Without any aggravating or relieving factor, present at rest lasted about 10 to 12 minutes.  Further patient's LifeVest alerted him that it had "taken action ".  Although patient does not report feeling anything.  EMS was called by patient who did an EKG and diagnosed patient with wide-complex tachycardia with heart rate in the 180s.  It subsequently self reverted to sinus rhythm.  And patient has been in sinus rhythm in the ER. See media section (EMS run sheet) for rhythm strips of event.   Patient denies any fever vomiting diarrhea rash on his skin shortness of breath palpitation or loss of consciousness.   Case has been discussed with cardiology by ER attending, patient has been started on amiodarone  bolus.  Patient is currently totally asymptomatic.  Medical evaluation is sought "  Assessment and Plan:  Chest pain secondary to Triklo tachycardia Plan of care discussed with cardiology According to cardiologist we will continue amiodarone  through 2 Monday EP physician to evaluate patient hopefully on Monday Continue telemetry closely   AKI (acute kidney injury) (HCC) Continue IV fluid resuscitation Monitor renal function Avoid nephrotoxic medications    Neuropathy Continue gabapentin    Chronic p paroxysmal A-fib (HCC)  Rate controlled on metoprolol  XL succinate 25 daily.   Continue with same.  At this time I will hold off on restarting the patient Eliquis  till we have a definite plan from cardiology to not do any procedures.   Malignant neoplasm metastatic to bone [facility]) Per uro note in 11/18//1345 :    78 year old healthy male with elevated PSA of 49 who underwent a prostate biopsy on 05/04/2021 showing high risk prostate cancer including Gleason score 4+5= 9 with 100% core involvement.   Staging imaging with CT and bone scan showed a enlarged left pelvic node, periaortic node consistent with metastatic disease, as well as a scapular lesion on bone scan that may represent prior injury versus metastatic disease.   We discussed his new diagnosis of metastatic prostate cancer and need for referral to oncology   I do not see any onc notes in chart..   Hyperlipidemia Continue with atorvastatin  and aspirin      DVT ppx - resume apixaban  once cleard by cardio.    Advance Care Planning:   Code Status: Full Code    Consults: cardiology.   Family Communication: per pateint.  Subjective:  Patient seen and examined at bedside this morning Denies nausea vomiting abdominal pain chest pain cough  Physical Exam:  General: Patient is alert and awake appears to be in no distress gives a fully coherent account of his symptoms Respiratory exam: Bilateral intravesicular Cardiovascular exam S1-S2 normal Abdomen all quadrants soft nontender Extremities warm without edema Patient is wearing his external cardiac defibrillator device.  Vitals:  11/10/23 0400 11/10/23 0425 11/10/23 0739 11/10/23 1132  BP:  126/76 (!) 116/57 111/81  Pulse:  64 (!) 50 (!) 56  Resp: 19     Temp:  97.7 F (36.5 C) 98 F (36.7 C) 98.2 F (36.8 C)  TempSrc:      SpO2:  98% 99% 98%  Weight:      Height:        Data Reviewed: Chest x-ray reviewed did not  show any acute pathology    Latest Ref Rng & Units 11/10/2023    4:44 AM 11/09/2023    4:11 PM 11/01/2023    2:43 AM  CBC  WBC 4.0 - 10.5 K/uL 5.9  5.6  5.0   Hemoglobin 13.0 - 17.0 g/dL 16.1  09.6  04.5   Hematocrit 39.0 - 52.0 % 37.2  40.8  37.4   Platelets 150 - 400 K/uL 190  217  184        Latest Ref Rng & Units 11/10/2023    4:44 AM 11/09/2023    4:11 PM 11/02/2023    3:24 AM  BMP  Glucose 70 - 99 mg/dL 99  409  811   BUN 8 - 23 mg/dL 19  22  16    Creatinine 0.61 - 1.24 mg/dL 9.14  7.82  9.56   Sodium 135 - 145 mmol/L 140  134  139   Potassium 3.5 - 5.1 mmol/L 3.9  3.5  3.9   Chloride 98 - 111 mmol/L 110  102  108   CO2 22 - 32 mmol/L 24  24  23    Calcium  8.9 - 10.3 mg/dL 8.9  9.3  9.1      Time spent: 56 minutes  Author: Ezzard Holms, MD 11/10/2023 3:32 PM  For on call review [email or web address].

## 2023-11-11 DIAGNOSIS — R079 Chest pain, unspecified: Secondary | ICD-10-CM | POA: Diagnosis not present

## 2023-11-11 DIAGNOSIS — I259 Chronic ischemic heart disease, unspecified: Secondary | ICD-10-CM

## 2023-11-11 DIAGNOSIS — R072 Precordial pain: Secondary | ICD-10-CM

## 2023-11-11 DIAGNOSIS — I480 Paroxysmal atrial fibrillation: Secondary | ICD-10-CM | POA: Diagnosis not present

## 2023-11-11 DIAGNOSIS — E876 Hypokalemia: Secondary | ICD-10-CM | POA: Diagnosis not present

## 2023-11-11 DIAGNOSIS — R000 Tachycardia, unspecified: Secondary | ICD-10-CM | POA: Diagnosis not present

## 2023-11-11 LAB — CBC WITH DIFFERENTIAL/PLATELET
Abs Immature Granulocytes: 0.03 10*3/uL (ref 0.00–0.07)
Basophils Absolute: 0.1 10*3/uL (ref 0.0–0.1)
Basophils Relative: 1 %
Eosinophils Absolute: 0.2 10*3/uL (ref 0.0–0.5)
Eosinophils Relative: 4 %
HCT: 38.1 % — ABNORMAL LOW (ref 39.0–52.0)
Hemoglobin: 12.9 g/dL — ABNORMAL LOW (ref 13.0–17.0)
Immature Granulocytes: 1 %
Lymphocytes Relative: 21 %
Lymphs Abs: 1 10*3/uL (ref 0.7–4.0)
MCH: 31.6 pg (ref 26.0–34.0)
MCHC: 33.9 g/dL (ref 30.0–36.0)
MCV: 93.4 fL (ref 80.0–100.0)
Monocytes Absolute: 0.6 10*3/uL (ref 0.1–1.0)
Monocytes Relative: 12 %
Neutro Abs: 3 10*3/uL (ref 1.7–7.7)
Neutrophils Relative %: 61 %
Platelets: 200 10*3/uL (ref 150–400)
RBC: 4.08 MIL/uL — ABNORMAL LOW (ref 4.22–5.81)
RDW: 13.2 % (ref 11.5–15.5)
WBC: 4.8 10*3/uL (ref 4.0–10.5)
nRBC: 0 % (ref 0.0–0.2)

## 2023-11-11 LAB — BASIC METABOLIC PANEL WITH GFR
Anion gap: 8 (ref 5–15)
BUN: 21 mg/dL (ref 8–23)
CO2: 22 mmol/L (ref 22–32)
Calcium: 9.2 mg/dL (ref 8.9–10.3)
Chloride: 108 mmol/L (ref 98–111)
Creatinine, Ser: 0.98 mg/dL (ref 0.61–1.24)
GFR, Estimated: >60 mL/min (ref >60)
Glucose, Bld: 109 mg/dL — ABNORMAL HIGH (ref 70–99)
Potassium: 4.3 mmol/L (ref 3.5–5.1)
Sodium: 138 mmol/L (ref 135–145)

## 2023-11-11 MED ORDER — APIXABAN 5 MG PO TABS
5.0000 mg | ORAL_TABLET | Freq: Two times a day (BID) | ORAL | Status: DC
  Administered 2023-11-11 – 2023-11-15 (×10): 5 mg via ORAL
  Filled 2023-11-11 (×10): qty 1

## 2023-11-11 NOTE — Progress Notes (Signed)
 Progress Note   Patient: Juan Huynh. UJW:[id number] DOB: Dec 20, 1945 DOA: 11/09/2023     2 DOS: the patient was seen and examined on 11/11/2023     Brief hospital course: From HPI "Juan Huynh. is a 78 y.o. male with medical history significant of Wide-complex tachycardia that was initially diagnosed on or around October 31, 2023.  At that time patient had received amiodarone  and also diagnosed with new onset atrial flutter.  Patient was started on beta-blockers and losartan .  Patient was discharged with external automatic cardio defibrillator device EF was 50 to 55%.   Patient was in his usual state of health till around 4 PM this afternoon when patient reports new onset of nonradiating anterior chest pain aching.  Without any aggravating or relieving factor, present at rest lasted about 10 to 12 minutes.  Further patient's LifeVest alerted him that it had "taken action ".  Although patient does not report feeling anything.  EMS was called by patient who did an EKG and diagnosed patient with wide-complex tachycardia with heart rate in the 180s.  It subsequently self reverted to sinus rhythm.  And patient has been in sinus rhythm in the ER. See media section (EMS run sheet) for rhythm strips of event.   Patient denies any fever vomiting diarrhea rash on his skin shortness of breath palpitation or loss of consciousness.   Case has been discussed with cardiology by ER attending, patient has been started on amiodarone  bolus.  Patient is currently totally asymptomatic.  Medical evaluation is sought "   Assessment and Plan:   Chest pain secondary to Triklo tachycardia Plan of care discussed with cardiology According to cardiologist we will continue amiodarone  through 2 Monday Awaiting EP cardiologist evaluation tomorrow Continue telemetry closely   AKI (acute kidney injury) (HCC) Continue IV fluid resuscitation Monitor renal function Avoid nephrotoxic medications    Neuropathy Continue gabapentin    Chronic p paroxysmal A-fib (HCC)  Rate controlled on metoprolol  XL succinate 25 daily.   Continue with same.  At this time I will hold off on restarting the patient Eliquis  till we have a definite plan from cardiology to not do any procedures.   Malignant neoplasm metastatic to bone [facility]) Per uro note in 11/18//9787 :    78 year old healthy male with elevated PSA of 49 who underwent a prostate biopsy on 05/04/2021 showing high risk prostate cancer including Gleason score 4+5= 9 with 100% core involvement.   Staging imaging with CT and bone scan showed a enlarged left pelvic node, periaortic node consistent with metastatic disease, as well as a scapular lesion on bone scan that may represent prior injury versus metastatic disease.   We discussed his new diagnosis of metastatic prostate cancer and need for referral to oncology   I do not see any onc notes in chart..   Hyperlipidemia Continue with atorvastatin  and aspirin      DVT ppx - resume apixaban  once cleard by cardio.    Advance Care Planning:   Code Status: Full Code    Consults: cardiology.   Family Communication: per pateint.   Subjective:  Patient seen and examined at bedside this morning Patient denies any acute overnight complaints   Physical Exam:   General: Patient is alert and awake appears to be in no distress gives a fully coherent account of his symptoms Respiratory exam: Bilateral intravesicular Cardiovascular exam S1-S2 normal Abdomen all quadrants soft nontender Extremities warm without edema Patient is wearing his external cardiac defibrillator device.  Data Reviewed: Chest x-ray reviewed did not show any acute pathology   Vitals:   11/11/23 0000 11/11/23 0359 11/11/23 0819 11/11/23 1151  BP:  120/74 (!) 117/54 (!) 111/56  Pulse:  (!) 56 (!) 53 (!) 58  Resp: 18  18 18   Temp:  97.8 F (36.6 C) 97.9 F (36.6 C) 98.1 F (36.7 C)  TempSrc:      SpO2:  97%  98% 96%  Weight:      Height:          Latest Ref Rng & Units 11/11/2023    3:53 AM 11/10/2023    4:44 AM 11/09/2023    4:11 PM  CBC  WBC 4.0 - 10.5 K/uL 4.8  5.9  5.6   Hemoglobin 13.0 - 17.0 g/dL 78.2  95.6  21.3   Hematocrit 39.0 - 52.0 % 38.1  37.2  40.8   Platelets 150 - 400 K/uL 200  190  217        Latest Ref Rng & Units 11/11/2023    3:53 AM 11/10/2023    4:44 AM 11/09/2023    4:11 PM  BMP  Glucose 70 - 99 mg/dL 086  99  578   BUN 8 - 23 mg/dL 21  19  22    Creatinine 0.61 - 1.24 mg/dL 4.69  6.29  5.28   Sodium 135 - 145 mmol/L 138  140  134   Potassium 3.5 - 5.1 mmol/L 4.3  3.9  3.5   Chloride 98 - 111 mmol/L 108  110  102   CO2 22 - 32 mmol/L 22  24  24    Calcium  8.9 - 10.3 mg/dL 9.2  8.9  9.3      Author: Ezzard Holms, MD 11/11/2023 4:28 PM  For on call review [email or web address].

## 2023-11-11 NOTE — Progress Notes (Addendum)
 Cardiology Progress Note   Patient Name: Juan Huynh. Date of Encounter: 11/11/2023  Primary Cardiologist: [facility]  Subjective   No c/p or sob.  No complaints this AM. Objective   Inpatient Medications    Scheduled Meds:  apixaban   5 mg Oral BID   aspirin   81 mg Oral Daily   atorvastatin   40 mg Oral Daily   gabapentin   100 mg Oral QHS   losartan   25 mg Oral Daily   metoprolol  succinate  12.5 mg Oral Daily   sodium chloride  flush  3 mL Intravenous Q12H   Continuous Infusions:  amiodarone  30 mg/hr (11/11/23 0213)   PRN Meds: acetaminophen  **OR** acetaminophen , polyethylene glycol, polyvinyl alcohol   Vital Signs    Vitals:   11/10/23 2034 11/11/23 0000 11/11/23 0359 11/11/23 0819  BP: (!) 121/59  120/74 (!) 117/54  Pulse: 61  (!) 56 (!) 53  Resp:  18  18  Temp: 97.8 F (36.6 C)  97.8 F (36.6 C) 97.9 F (36.6 C)  TempSrc:      SpO2: 99%  97% 98%  Weight:      Height:        Intake/Output Summary (Last 24 hours) at 11/11/2023 0938 Last data filed at 11/11/2023 4098 Gross per 24 hour  Intake 720 ml  Output --  Net 720 ml   Filed Weights   11/09/23 1607 11/09/23 1945  Weight: 88.5 kg 88.5 kg    Physical Exam   GEN: Well nourished, well developed, in no acute distress.  HEENT: Grossly normal.  Neck: Supple, no JVD, carotid bruits, or masses. Cardiac: RRR, no murmurs, rubs, or gallops. No clubbing, cyanosis, edema.  Radials 2+, DP/PT 2+ and equal bilaterally.  Respiratory:  Respirations regular and unlabored, clear to auscultation bilaterally. GI: Soft, nontender, nondistended, BS + x 4. MS: no deformity or atrophy. Skin: warm and dry, no rash. Neuro:  Strength and sensation are intact. Psych: AAOx3.  Normal affect.  Labs    Chemistry Recent Labs  Lab 11/09/23 1611 11/10/23 0444 11/11/23 0353  NA 134* 140 138  K 3.5 3.9 4.3  CL 102 110 108  CO2 24 24 22   GLUCOSE 139* 99 109*  BUN 22 19 21   CREATININE 1.34* 1.02 0.98  CALCIUM  9.3 8.9  9.2  GFRNONAA 55* >60 >60  ANIONGAP 8 7 8      Hematology Recent Labs  Lab 11/09/23 1611 11/10/23 0444 11/11/23 0353  WBC 5.6 5.9 4.8  RBC 4.33 3.97* 4.08*  HGB 13.7 12.5* 12.9*  HCT 40.8 37.2* 38.1*  MCV 94.2 93.7 93.4  MCH 31.6 31.5 31.6  MCHC 33.6 33.6 33.9  RDW 13.1 13.1 13.2  PLT 217 190 200    Cardiac Enzymes  Recent Labs  Lab 10/31/23 1634 10/31/23 1828 11/09/23 1611 11/09/23 1801 11/09/23 2230  TROPONINIHS 19* 46* 12 22* 28*     HbA1c  Lab Results  Component Value Date   HGBA1C 5.7 (H) 11/10/2023    Radiology    DG Chest Portable 1 View Result Date: 11/09/2023 CLINICAL DATA:  Chest pain. EXAM: PORTABLE CHEST 1 VIEW COMPARISON:  October 31, 2023. FINDINGS: Stable cardiomediastinal silhouette. Both lungs are clear. The visualized skeletal structures are unremarkable. IMPRESSION: No active disease. Electronically Signed   By: Rosalene Colon M.D.   On: 11/09/2023 18:01     Telemetry    Sinus bradycardia, 50's, occas PVCs - Personally Reviewed  Cardiac Studies   2D Echocardiogram 5.1.2025  1.  Left ventricular ejection fraction, by estimation, is 50 to 55%. The  left ventricle has low normal function. The left ventricle demonstrates  regional wall motion abnormalities (see scoring diagram/findings for  description). Left ventricular diastolic   parameters were normal.   2. TAPSE and S' normal, but mildly reduced FAC and overall appearance  suggestive of mild RV dysfunction. Right ventricular systolic function is  normal. The right ventricular size is mildly enlarged. There is mildly  elevated pulmonary artery systolic  pressure. The estimated right ventricular systolic pressure is 38.8 mmHg.   3. The mitral valve is normal in structure. No evidence of mitral valve  regurgitation.   4. Tricuspid valve regurgitation is mild to moderate.   5. The aortic valve is tricuspid. Aortic valve regurgitation is mild to  moderate.  _____________   Patient  Profile     78 y.o. male  with a history of heart failure midrange ejection fraction, PACs/PVCs, primary hypertension, normal coronary arteries by catheterization in 2024, metastatic prostate cancer status post ADT/XRT, and ventricular tachycardia, who was admitted 5/9 w/ recurrent VT.  Assessment & Plan    1.  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  Recently admitted to [facility] w/ WCT consistent w/ VT.  Seen by EP and placed on amio. D/C'd w/ Zio.  [facility] and LifeVest placed.  On 5/9, developed c/p and received alert from LifeVest that therapy delivered.  He didn't feel a shock.  C/p resolved.  Initial rhythm upon EMS arrival was VT but he converted to sinus in the field.  Review of iRhythm strips shows WCT, similar to prior presentation and likely consistent w/ VT.  No recurrence since ED arrival.  Labs stable.  Cont IV amio. Will ask EP to eval on Monday.  Will order cMRI.   2.  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  Euvolemic.  Feels well w/o dyspnea.  Cont ? blocker and ARB.   3.  Primary HTN:  Stable.  Cont ? blocker and arb.   4.  HL:  Cont statin.   5.  Atrial flutter:  reported in [facility] ED after VT episode.  Per EP, cont amio.  Pt is on eliquis  as outpt - resume.  Signed, Laneta Pintos, NP  11/11/2023, 9:38 AM    For questions or updates, please contact   Please consult [email or web address] for contact info under Cardiology/STEMI.

## 2023-11-12 ENCOUNTER — Inpatient Hospital Stay

## 2023-11-12 DIAGNOSIS — I472 Ventricular tachycardia, unspecified: Secondary | ICD-10-CM | POA: Diagnosis not present

## 2023-11-12 DIAGNOSIS — I4729 Other ventricular tachycardia: Secondary | ICD-10-CM | POA: Diagnosis not present

## 2023-11-12 LAB — CBC WITH DIFFERENTIAL/PLATELET
Abs Immature Granulocytes: 0.03 10*3/uL (ref 0.00–0.07)
Basophils Absolute: 0.1 10*3/uL (ref 0.0–0.1)
Basophils Relative: 1 %
Eosinophils Absolute: 0.2 10*3/uL (ref 0.0–0.5)
Eosinophils Relative: 4 %
HCT: 38.1 % — ABNORMAL LOW (ref 39.0–52.0)
Hemoglobin: 13.1 g/dL (ref 13.0–17.0)
Immature Granulocytes: 1 %
Lymphocytes Relative: 17 %
Lymphs Abs: 1 10*3/uL (ref 0.7–4.0)
MCH: 32 pg (ref 26.0–34.0)
MCHC: 34.4 g/dL (ref 30.0–36.0)
MCV: 93.2 fL (ref 80.0–100.0)
Monocytes Absolute: 0.6 10*3/uL (ref 0.1–1.0)
Monocytes Relative: 10 %
Neutro Abs: 4 10*3/uL (ref 1.7–7.7)
Neutrophils Relative %: 67 %
Platelets: 201 10*3/uL (ref 150–400)
RBC: 4.09 MIL/uL — ABNORMAL LOW (ref 4.22–5.81)
RDW: 12.9 % (ref 11.5–15.5)
WBC: 5.8 10*3/uL (ref 4.0–10.5)
nRBC: 0 % (ref 0.0–0.2)

## 2023-11-12 LAB — BASIC METABOLIC PANEL WITH GFR
Anion gap: 6 (ref 5–15)
BUN: 22 mg/dL (ref 8–23)
CO2: 25 mmol/L (ref 22–32)
Calcium: 9 mg/dL (ref 8.9–10.3)
Chloride: 106 mmol/L (ref 98–111)
Creatinine, Ser: 0.95 mg/dL (ref 0.61–1.24)
GFR, Estimated: >60 mL/min (ref >60)
Glucose, Bld: 106 mg/dL — ABNORMAL HIGH (ref 70–99)
Potassium: 4 mmol/L (ref 3.5–5.1)
Sodium: 137 mmol/L (ref 135–145)

## 2023-11-12 LAB — MAGNESIUM: Magnesium: 2 mg/dL (ref 1.7–2.4)

## 2023-11-12 MED ORDER — AMIODARONE HCL 400 MG PO TABS: 400.0000 mg | ORAL_TABLET | Freq: Two times a day (BID) | ORAL | 2 refills | Status: DC

## 2023-11-12 MED ORDER — POLYETHYLENE GLYCOL 3350 17 G PO PACK: 17.0000 g | PACK | Freq: Every day | ORAL | 0 refills | Status: AC | PRN

## 2023-11-12 MED ORDER — AMIODARONE HCL IN DEXTROSE 360-4.14 MG/200ML-% IV SOLN
60.0000 mg/h | INTRAVENOUS | Status: AC
  Administered 2023-11-12: 60 mg/h via INTRAVENOUS
  Filled 2023-11-12: qty 200

## 2023-11-12 MED ORDER — AMIODARONE HCL 200 MG PO TABS
400.0000 mg | ORAL_TABLET | Freq: Two times a day (BID) | ORAL | Status: DC
  Administered 2023-11-12: 400 mg via ORAL
  Filled 2023-11-12: qty 2

## 2023-11-12 MED ORDER — GADOBUTROL 1 MMOL/ML IV SOLN
12.0000 mL | Freq: Once | INTRAVENOUS | Status: AC | PRN
Start: 2023-11-12 — End: 2023-11-12
  Administered 2023-11-12: 12 mL via INTRAVENOUS

## 2023-11-12 MED ORDER — AMIODARONE HCL IN DEXTROSE 360-4.14 MG/200ML-% IV SOLN
30.0000 mg/h | INTRAVENOUS | Status: DC
Start: 2023-11-12 — End: 2023-11-16
  Administered 2023-11-12 – 2023-11-15 (×6): 30 mg/h via INTRAVENOUS
  Filled 2023-11-12 (×6): qty 200

## 2023-11-12 NOTE — Progress Notes (Signed)
 Rounding Note    Patient Name: Juan Huynh. Date of Encounter: 11/12/2023  [facility] Cardiologist: [facility]  Subjective   Patient reports feeling back to baseline. No further episodes of chest pain. He denies shortness of breath and palpitations. Telemetry reviewed and shows sinus rhythm with occasional PVCs and no further VT.   Inpatient Medications    Scheduled Meds:  apixaban   5 mg Oral BID   aspirin   81 mg Oral Daily   atorvastatin   40 mg Oral Daily   gabapentin   100 mg Oral QHS   losartan   25 mg Oral Daily   metoprolol  succinate  12.5 mg Oral Daily   sodium chloride  flush  3 mL Intravenous Q12H   Continuous Infusions:  amiodarone  30 mg/hr (11/12/23 0610)   PRN Meds: acetaminophen  **OR** acetaminophen , polyethylene glycol, polyvinyl alcohol   Vital Signs    Vitals:   11/11/23 2156 11/12/23 0001 11/12/23 0351 11/12/23 0931  BP: 117/76 130/75 108/71 (!) 111/55  Pulse: 61 (!) 59 (!) 57 (!) 55  Resp:  18 19 16   Temp: 98.4 F (36.9 C) 97.7 F (36.5 C) 98.3 F (36.8 C) 97.6 F (36.4 C)  TempSrc: Oral  Oral Oral  SpO2: 97% 98% 96% 99%  Weight:      Height:        Intake/Output Summary (Last 24 hours) at 11/12/2023 1010 Last data filed at 11/11/2023 1858 Gross per 24 hour  Intake 968.19 ml  Output --  Net 968.19 ml      11/09/2023    7:45 PM 11/09/2023    4:07 PM 11/02/2023    6:31 AM  Last 3 Weights  Weight (lbs) 195 lb 1.7 oz 195 lb 195 lb 4.8 oz  Weight (kg) 88.5 kg 88.451 kg 88.587 kg      Telemetry    Sinus rhythm with occasional PVCs, rate 50s-60s bpm, no further episodes of VT - Personally Reviewed  Physical Exam   GEN: No acute distress.   Neck: No JVD Cardiac: RRR, no murmurs, rubs, or gallops.  Respiratory: Clear to auscultation bilaterally. GI: Soft, nontender, non-distended  MS: No edema; No deformity. Neuro:  Nonfocal  Psych: Normal affect   Labs    High Sensitivity Troponin:   Recent Labs  Lab  10/31/23 1634 10/31/23 1828 11/09/23 1611 11/09/23 1801 11/09/23 2230  TROPONINIHS 19* 46* 12 22* 28*     Chemistry Recent Labs  Lab 11/09/23 1801 11/10/23 0444 11/11/23 0353 11/12/23 0355  NA  --  140 138 137  K  --  3.9 4.3 4.0  CL  --  110 108 106  CO2  --  24 22 25   GLUCOSE  --  99 109* 106*  BUN  --  19 21 22   CREATININE  --  1.02 1.61 0.95  CALCIUM   --  8.9 9.2 9.0  MG 2.0  --   --   --   GFRNONAA  --  >60 >60 >60  ANIONGAP  --  7 8 6     Lipids No results for input(s): "CHOL", "TRIG", "HDL", "LABVLDL", "LDLCALC", "CHOLHDL" in the last 168 hours.  Hematology Recent Labs  Lab 11/10/23 0444 11/11/23 0353 11/12/23 0355  WBC 5.9 4.8 5.8  RBC 3.97* 4.08* 4.09*  HGB 12.5* 12.9* 13.1  HCT 37.2* 38.1* 38.1*  MCV 93.7 93.4 93.2  MCH 31.5 31.6 32.0  MCHC 33.6 33.9 34.4  RDW 13.1 13.2 12.9  PLT 190 200 201   Thyroid   No results for input(s): "TSH", "FREET4" in the last 168 hours.  BNPNo results for input(s): "BNP", "PROBNP" in the last 168 hours.  DDimer No results for input(s): "DDIMER" in the last 168 hours.   Radiology    No results found.  Cardiac Studies   11/01/2023 Echo complete 1. Left ventricular ejection fraction, by estimation, is 50 to 55%. The  left ventricle has low normal function. The left ventricle demonstrates  regional wall motion abnormalities (see scoring diagram/findings for  description). Left ventricular diastolic   parameters were normal.   2. TAPSE and S' normal, but mildly reduced FAC and overall appearance  suggestive of mild RV dysfunction. Right ventricular systolic function is  normal. The right ventricular size is mildly enlarged. There is mildly  elevated pulmonary artery systolic  pressure. The estimated right ventricular systolic pressure is 38.8 mmHg.   3. The mitral valve is normal in structure. No evidence of mitral valve  regurgitation.   4. Tricuspid valve regurgitation is mild to moderate.   5. The aortic valve is  tricuspid. Aortic valve regurgitation is mild to  moderate.   Patient Profile     78 y.o. male with a history of heart failure midrange ejection fraction, PACs/PVCs, primary hypertension, normal coronary arteries by catheterization in 2024, metastatic prostate cancer status post ADT/XRT, and ventricular tachycardia, who was admitted 5/9 w/ recurrent VT.   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 Seen by EP and placed on amiodarone .  - Discharged with ZIO. [facility]. - On 5/9, patient developed chest pain and received a call from LifeVest that therapy delivered. Patient did not feel shock. Chest pain resolved. Initial rhythm upon EMS arrival was VT but he converted to sinus in the field. - Review of iRhythm strip shows wide-complex tachycardia, similar to prior presentation likely consistent with V. tach - No recurrence since ED arrival - Labs stable - cMRI done this morning, results pending - Will transition from IV to oral amiodarone  400 mg twice daily per EP recommendation - Patient is scheduled for OP follow up with EP/cardiology at the [facility] tomorrow morning - Will need LifeVest replaced prior to discharge  HFmrEF - EF 45% at [facility] last year, improved to 50-55% by echo 5/1 - Appears euvolemic on exam - Continue losartan  and metoprolol   Primary hypertension - BP stable - Continue ARB and BB as above  Hyperlipidemia - Continue statin  Atrial flutter - Reported in [place] after VT episode - Amiodarone  per EP - Continue PTA Eliquis   For questions or updates, please contact [facility] Please consult [email or web address] for contact info under        Signed, Brodie Cannon, PA-C  11/12/2023, 10:10 AM

## 2023-11-12 NOTE — Progress Notes (Signed)
   Brief Note   Notified by RN that patient was experiencing chest pain and lightheadedness with NSVT on monitor. Telemetry reviewed and showed several (20+) runs of NSVT up to 16 beats since 1430 this afternoon. Patient was evaluated and in no acute distress. No symptoms of cardiac decompensation. Hemodynamically stable. Will transition back to IV amiodarone  with plan for EP to consult in the morning.   Signed, Brodie Cannon, PA-C  11/12/2023, 4:32 PM

## 2023-11-12 NOTE — Progress Notes (Signed)
 Progress Note   Patient: Juan Huynh. ZOX:[id number] DOB: 05-19-1946 DOA: 11/09/2023     3 DOS: the patient was seen and examined on 11/12/2023     Brief hospital course: From HPI "Juan Huynh. is a 78 y.o. male with medical history significant of Wide-complex tachycardia that was initially diagnosed on or around October 31, 2023.  At that time patient had received amiodarone  and also diagnosed with new onset atrial flutter.  Patient was started on beta-blockers and losartan .  Patient was discharged with external automatic cardio defibrillator device EF was 50 to 55%.   Patient was in his usual state of health till around 4 PM this afternoon when patient reports new onset of nonradiating anterior chest pain aching.  Without any aggravating or relieving factor, present at rest lasted about 10 to 12 minutes.  Further patient's LifeVest alerted him that it had "taken action ".  Although patient does not report feeling anything.  EMS was called by patient who did an EKG and diagnosed patient with wide-complex tachycardia with heart rate in the 180s.  It subsequently self reverted to sinus rhythm.  And patient has been in sinus rhythm in the ER. See media section (EMS run sheet) for rhythm strips of event.   Patient denies any fever vomiting diarrhea rash on his skin shortness of breath palpitation or loss of consciousness.   Case has been discussed with cardiology by ER attending, patient has been started on amiodarone  bolus.  Patient is currently totally asymptomatic.  Medical evaluation is sought "   Assessment and Plan:   Chest pain secondary to Triklo tachycardia Plan of care discussed with cardiology Continue IV amiodarone  as recommended by cardiology EP cardiologist have been informed and have agreed to evaluate patient tomorrow Continue telemetry closely   AKI (acute kidney injury) (HCC) Has completed a course of IV fluid Monitor renal function Avoid nephrotoxic medications    Neuropathy Continue gabapentin    Chronic p paroxysmal A-fib (HCC)  Rate controlled on metoprolol  XL succinate 25 daily.   Continue with same.  At this time I will hold off on restarting the patient Eliquis  till we have a definite plan from cardiology to not do any procedures.   Malignant neoplasm metastatic to bone [facility]) Per uro note in 11/18//3265 :    78 year old healthy male with elevated PSA of 49 who underwent a prostate biopsy on 05/04/2021 showing high risk prostate cancer including Gleason score 4+5= 9 with 100% core involvement.   Staging imaging with CT and bone scan showed a enlarged left pelvic node, periaortic node consistent with metastatic disease, as well as a scapular lesion on bone scan that may represent prior injury versus metastatic disease.   We discussed his new diagnosis of metastatic prostate cancer and need for referral to oncology   I do not see any onc notes in chart..   Hyperlipidemia Continue with atorvastatin  and aspirin      DVT ppx - resume apixaban  once cleard by cardio.    Advance Care Planning:   Code Status: Full Code    Consults: cardiology.   Family Communication: per pateint.   Subjective:  Patient seen and examined at bedside this morning Just before discharge patient started having some dizziness and uncontrolled arrhythmia Amiodarone  drip has been started   Physical Exam:   General: Patient is alert and awake appears to be in no distress gives a fully coherent account of his symptoms Respiratory exam: Bilateral intravesicular Cardiovascular exam S1-S2 normal Abdomen  all quadrants soft nontender Extremities warm without edema Patient is wearing his external cardiac defibrillator device.       Data Reviewed: Chest x-ray reviewed did not show any acute pathology        Latest Ref Rng & Units 11/12/2023    3:55 AM 11/11/2023    3:53 AM 11/10/2023    4:44 AM  CBC  WBC 4.0 - 10.5 K/uL 5.8  4.8  5.9   Hemoglobin 13.0 - 17.0  g/dL 96.0  45.4  09.8   Hematocrit 39.0 - 52.0 % 38.1  38.1  37.2   Platelets 150 - 400 K/uL 201  200  190        Latest Ref Rng & Units 11/12/2023    3:55 AM 11/11/2023    3:53 AM 11/10/2023    4:44 AM  BMP  Glucose 70 - 99 mg/dL 119  147  99   BUN 8 - 23 mg/dL 22  21  19    Creatinine 0.61 - 1.24 mg/dL 8.29  5.62  1.30   Sodium 135 - 145 mmol/L 137  138  140   Potassium 3.5 - 5.1 mmol/L 4.0  4.3  3.9   Chloride 98 - 111 mmol/L 106  108  110   CO2 22 - 32 mmol/L 25  22  24    Calcium  8.9 - 10.3 mg/dL 9.0  9.2  8.9      Vitals:   11/12/23 0351 11/12/23 0931 11/12/23 1058 11/12/23 1455  BP: 108/71 (!) 111/55 111/63 112/67  Pulse: (!) 57 (!) 55 (!) 58 65  Resp: 19 16 19 20   Temp: 98.3 F (36.8 C) 97.6 F (36.4 C) 98.3 F (36.8 C) 98.4 F (36.9 C)  TempSrc: Oral Oral  Oral  SpO2: 96% 99% 98% 98%  Weight:      Height:         Author: Ezzard Holms, MD 11/12/2023 4:14 PM  For on call review [email or web address].
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
## 2023-11-12 NOTE — TOC CM/SW Note (Signed)
 Transition of Care [facility]) - Inpatient Brief Assessment   Patient Details  Name: Juan Huynh. MRN: [id number] Date of Birth: September 07, 1945  Transition of Care [facility]) CM/SW Contact:    Odilia Bennett, LCSW Phone Number: 11/12/2023, 11:13 AM   Clinical Narrative: Patient has orders to discharge home today. Chart reviewed. No TOC needs identified. CSW signing off.  Transition of Care Asessment: Insurance and Status: Insurance coverage has been reviewed Patient has primary care physician: Yes Home environment has been reviewed: Single family home Prior level of function:: Not documented Prior/Current Home Services: No current home services Social Drivers of Health Review: SDOH reviewed no interventions necessary Readmission risk has been reviewed: Yes Transition of care needs: no transition of care needs at this time

## 2023-11-13 DIAGNOSIS — I4729 Other ventricular tachycardia: Secondary | ICD-10-CM

## 2023-11-13 DIAGNOSIS — I493 Ventricular premature depolarization: Secondary | ICD-10-CM | POA: Diagnosis not present

## 2023-11-13 DIAGNOSIS — I472 Ventricular tachycardia, unspecified: Secondary | ICD-10-CM | POA: Diagnosis not present

## 2023-11-13 LAB — BASIC METABOLIC PANEL WITH GFR
Anion gap: 8 (ref 5–15)
BUN: 19 mg/dL (ref 8–23)
CO2: 25 mmol/L (ref 22–32)
Calcium: 9.3 mg/dL (ref 8.9–10.3)
Chloride: 106 mmol/L (ref 98–111)
Creatinine, Ser: 0.84 mg/dL (ref 0.61–1.24)
GFR, Estimated: >60 mL/min (ref >60)
Glucose, Bld: 102 mg/dL — ABNORMAL HIGH (ref 70–99)
Potassium: 4 mmol/L (ref 3.5–5.1)
Sodium: 139 mmol/L (ref 135–145)

## 2023-11-13 LAB — CBC WITH DIFFERENTIAL/PLATELET
Abs Immature Granulocytes: 0.04 10*3/uL (ref 0.00–0.07)
Basophils Absolute: 0.1 10*3/uL (ref 0.0–0.1)
Basophils Relative: 1 %
Eosinophils Absolute: 0.2 10*3/uL (ref 0.0–0.5)
Eosinophils Relative: 4 %
HCT: 39.5 % (ref 39.0–52.0)
Hemoglobin: 13.5 g/dL (ref 13.0–17.0)
Immature Granulocytes: 1 %
Lymphocytes Relative: 19 %
Lymphs Abs: 0.9 10*3/uL (ref 0.7–4.0)
MCH: 31.7 pg (ref 26.0–34.0)
MCHC: 34.2 g/dL (ref 30.0–36.0)
MCV: 92.7 fL (ref 80.0–100.0)
Monocytes Absolute: 0.5 10*3/uL (ref 0.1–1.0)
Monocytes Relative: 10 %
Neutro Abs: 3.3 10*3/uL (ref 1.7–7.7)
Neutrophils Relative %: 65 %
Platelets: 200 10*3/uL (ref 150–400)
RBC: 4.26 MIL/uL (ref 4.22–5.81)
RDW: 13 % (ref 11.5–15.5)
WBC: 5 10*3/uL (ref 4.0–10.5)
nRBC: 0 % (ref 0.0–0.2)

## 2023-11-13 MED ORDER — METOPROLOL SUCCINATE ER 25 MG PO TB24
25.0000 mg | ORAL_TABLET | Freq: Every day | ORAL | Status: DC
  Administered 2023-11-14 – 2023-11-15 (×2): 25 mg via ORAL
  Filled 2023-11-13 (×2): qty 1
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
## 2023-11-13 NOTE — Plan of Care (Signed)

## 2023-11-13 NOTE — TOC CM/SW Note (Addendum)
 Received call from [facility]. Patient notified his PCP there that he would like to transfer for ablation. CSW met with patient and confirmed. MD will review [facility] transfer paperwork.  Duke Gibbons, CSW [phone]  4:04 pm: Faxed transfer request to [facility].  Duke Gibbons, CSW [phone]

## 2023-11-13 NOTE — Plan of Care (Signed)

## 2023-11-13 NOTE — Progress Notes (Signed)
 Progress Note   Patient: Juan Huynh. ZOX:[id number] DOB: September 17, 1945 DOA: 11/09/2023     4 DOS: the patient was seen and examined on 11/13/2023     Brief hospital course: From HPI "Juan Huynh. is a 78 y.o. male with medical history significant of Wide-complex tachycardia that was initially diagnosed on or around October 31, 2023.  At that time patient had received amiodarone  and also diagnosed with new onset atrial flutter.  Patient was started on beta-blockers and losartan .  Patient was discharged with external automatic cardio defibrillator device EF was 50 to 55%.   Patient was in his usual state of health till around 4 PM this afternoon when patient reports new onset of nonradiating anterior chest pain aching.  Without any aggravating or relieving factor, present at rest lasted about 10 to 12 minutes.  Further patient's LifeVest alerted him that it had "taken action ".  Although patient does not report feeling anything.  EMS was called by patient who did an EKG and diagnosed patient with wide-complex tachycardia with heart rate in the 180s.  It subsequently self reverted to sinus rhythm.  And patient has been in sinus rhythm in the ER. See media section (EMS run sheet) for rhythm strips of event.   Patient denies any fever vomiting diarrhea rash on his skin shortness of breath palpitation or loss of consciousness.   Case has been discussed with cardiology by ER attending, patient has been started on amiodarone  bolus.  Patient is currently totally asymptomatic.  Medical evaluation is sought "   Assessment and Plan:   Chest pain secondary to Triklo tachycardia Plan of care discussed with cardiology Continue IV amiodarone  as recommended by cardiology Has been seen by EP cardiologist with recommendation for VT ablation at [facility] or at a [place] facility. Patient has chosen the [place] facility in [place]. Case management have initiated paperwork, however I am told this will not happen today  as the [place] will have to review the paperwork and determine if patient will be accepted for transfer. Continue telemetry We appreciate the input of cardiology   AKI (acute kidney injury) (HCC) Has completed a course of IV fluid Monitor renal function Avoid nephrotoxic medications   Neuropathy Continue gabapentin    Chronic p paroxysmal A-fib (HCC)  Rate controlled on metoprolol  XL succinate 25 daily.   Continue Eliquis    Malignant neoplasm metastatic to bone [facility]) Patient [facility] up with oncologist at [facility]  Hyperlipidemia Continue with atorvastatin  and aspirin      DVT ppx -continue Eliquis     Advance Care Planning:   Code Status: Full Code    Consults: cardiology.   Family Communication: per pateint.   Subjective:  Patient seen and examined at bedside this morning Has been seen by EP cardiologist with recommendation for VT ablation at [facility] or at the [facility] facility. Patient has chosen the [place] facility in [place]. Case management have initiated paperwork, however I am told this will not happen today as the [place] will have to review the paperwork and determine if patient will be accepted for transfer.   Physical Exam:   General: Patient is alert and awake appears to be in no distress gives a fully coherent account of his symptoms Respiratory exam: Bilateral intravesicular Cardiovascular exam S1-S2 normal Abdomen all quadrants soft nontender Extremities warm without edema Patient is wearing his external cardiac defibrillator device.       Data Reviewed: Chest x-ray reviewed did not show any acute pathology  Vitals:   11/12/23 2253 11/13/23 0410 11/13/23 0806 11/13/23 1202  BP: 124/77 114/68 118/75 122/69  Pulse: (!) 57 (!) 54 (!) 54 62  Resp:      Temp: 97.9 F (36.6 C) 98.2 F (36.8 C) 97.7 F (36.5 C) 98 F (36.7 C)  TempSrc: Oral     SpO2: 96% 94% 100% 99%  Weight:      Height:          Latest Ref Rng & Units 11/13/2023    5:35 AM 11/12/2023    3:55 AM  11/11/2023    3:53 AM  CBC  WBC 4.0 - 10.5 K/uL 5.0  5.8  4.8   Hemoglobin 13.0 - 17.0 g/dL 86.5  78.4  69.6   Hematocrit 39.0 - 52.0 % 39.5  38.1  38.1   Platelets 150 - 400 K/uL 200  201  200        Latest Ref Rng & Units 11/13/2023    5:35 AM 11/12/2023    3:55 AM 11/11/2023    3:53 AM  BMP  Glucose 70 - 99 mg/dL 295  284  132   BUN 8 - 23 mg/dL 19  22  21    Creatinine 0.61 - 1.24 mg/dL 4.40  1.02  7.25   Sodium 135 - 145 mmol/L 139  137  138   Potassium 3.5 - 5.1 mmol/L 4.0  4.0  4.3   Chloride 98 - 111 mmol/L 106  106  108   CO2 22 - 32 mmol/L 25  25  22    Calcium  8.9 - 10.3 mg/dL 9.3  9.0  9.2    [facility]: [facility]; after the [facility] and decides on [facility] we will then do physician to physician discussion  Author: Ezzard Holms, MD 11/13/2023 3:22 PM  For on call review [email or web address].

## 2023-11-14 DIAGNOSIS — I472 Ventricular tachycardia, unspecified: Principal | ICD-10-CM

## 2023-11-14 LAB — CBC WITH DIFFERENTIAL/PLATELET
Abs Immature Granulocytes: 0.03 10*3/uL (ref 0.00–0.07)
Basophils Absolute: 0.1 10*3/uL (ref 0.0–0.1)
Basophils Relative: 1 %
Eosinophils Absolute: 0.2 10*3/uL (ref 0.0–0.5)
Eosinophils Relative: 4 %
HCT: 37.3 % — ABNORMAL LOW (ref 39.0–52.0)
Hemoglobin: 12.8 g/dL — ABNORMAL LOW (ref 13.0–17.0)
Immature Granulocytes: 1 %
Lymphocytes Relative: 18 %
Lymphs Abs: 1 10*3/uL (ref 0.7–4.0)
MCH: 31.4 pg (ref 26.0–34.0)
MCHC: 34.3 g/dL (ref 30.0–36.0)
MCV: 91.4 fL (ref 80.0–100.0)
Monocytes Absolute: 0.6 10*3/uL (ref 0.1–1.0)
Monocytes Relative: 10 %
Neutro Abs: 3.7 10*3/uL (ref 1.7–7.7)
Neutrophils Relative %: 66 %
Platelets: 208 10*3/uL (ref 150–400)
RBC: 4.08 MIL/uL — ABNORMAL LOW (ref 4.22–5.81)
RDW: 12.8 % (ref 11.5–15.5)
WBC: 5.6 10*3/uL (ref 4.0–10.5)
nRBC: 0 % (ref 0.0–0.2)

## 2023-11-14 LAB — BASIC METABOLIC PANEL WITH GFR
Anion gap: 6 (ref 5–15)
BUN: 24 mg/dL — ABNORMAL HIGH (ref 8–23)
CO2: 25 mmol/L (ref 22–32)
Calcium: 9.2 mg/dL (ref 8.9–10.3)
Chloride: 107 mmol/L (ref 98–111)
Creatinine, Ser: 0.91 mg/dL (ref 0.61–1.24)
GFR, Estimated: >60 mL/min (ref >60)
Glucose, Bld: 104 mg/dL — ABNORMAL HIGH (ref 70–99)
Potassium: 4.1 mmol/L (ref 3.5–5.1)
Sodium: 138 mmol/L (ref 135–145)

## 2023-11-14 NOTE — Plan of Care (Signed)

## 2023-11-14 NOTE — Hospital Course (Addendum)
 Hospital course / significant events:   HPI: Juan Huynh. is a 78 y.o. male with medical history significant of Wide-complex tachycardia that was initially diagnosed on or around October 31, 2023.  At that time patient had received amiodarone  and also diagnosed with new onset atrial flutter.  Patient was started on beta-blockers and losartan .  Patient was discharged with external automatic cardio defibrillator device EF was 50 to 55%.   Patient was in his usual state of health till around 4 PM on 11/09/23 when patient had new onset of nonradiating anterior chest pain aching, no aggravating or relieving factor, present at rest lasted about 10 to 12 minutes. LifeVest alerted him that it had "taken action ".  EMS did EKG and diagnosed with wide-complex tachycardia with heart rate in the 180s.  It subsequently self reverted to sinus rhythm.  Cardiology and electrophysiology consulted. Pt had been doing well throughout hospitalization, was transitioned to PO amiodarone . On afternoon of 5/12 he had recurrence of CP and LH - that felt similar to his chest discomfort he had immediately prior to coming to hospital. Tele showed frequent runs of NSVT. He was transitioned back to amio gtt, and since that time, he has felt well. Plan for transfer to [facility] for VT ablation    Consultants:  Cardiology Electrophysiology   Procedures/Surgeries: none      ASSESSMENT & PLAN:   Chest pain secondary to ventricular tachycardia PVC's LifeVest tracings reviewed, highly suspicious for recurrent VT when transitioned to PO amio he had recurrence of NSVT IV amiodarone  drip plan VT ablation [facility]    Chronic p paroxysmal A-fib (HCC) Rate controlled on metoprolol  XL succinate 25 daily.   Anticoagulated on Eliquis   AKI (acute kidney injury) - resolved Monitor renal function Avoid nephrotoxic medications   Neuropathy Continue gabapentin     Malignant neoplasm metastatic to bone   Patient follows up with oncologist at [facility]   Hyperlipidemia Continue with atorvastatin  and aspirin     overweight based on BMI: Body mass index is 28.81 kg/m.Aaron Aas Significantly low or high BMI is associated with higher medical risk.  Underweight - under 18  overweight - 25 to 29 obese - 30 or more Class 1 obesity: BMI of 30.0 to 34 Class 2 obesity: BMI of 35.0 to 39 Class 3 obesity: BMI of 40.0 to 49 Super Morbid Obesity: BMI 50-59 Super-super Morbid Obesity: BMI 60+ Healthy nutrition and physical activity advised as adjunct to other disease management and risk reduction treatments    DVT prophylaxis: eliquis  IV fluids: no continuous IV fluids  Nutrition: cardiac Central lines / other devices: none  Code Status: FULL CODE ACP documentation reviewed:  none on file in [facility]

## 2023-11-14 NOTE — Progress Notes (Signed)
 PROGRESS NOTE    Juan Huynh.   ZOX:[id number] DOB: May 08, 1946  DOA: 11/09/2023 Date of Service: 11/14/23 which is hospital day 5  PCP: Administration, [facility] course / significant events:   HPI: Juan Huynh. is a 78 y.o. male with medical history significant of Wide-complex tachycardia that was initially diagnosed on or around October 31, 2023.  At that time patient had received amiodarone  and also diagnosed with new onset atrial flutter.  Patient was started on beta-blockers and losartan .  Patient was discharged with external automatic cardio defibrillator device EF was 50 to 55%.   Patient was in his usual state of health till around 4 PM on 11/09/23 when patient had new onset of nonradiating anterior chest pain aching, no aggravating or relieving factor, present at rest lasted about 10 to 12 minutes. LifeVest alerted him that it had "taken action ".  EMS did EKG and diagnosed with wide-complex tachycardia with heart rate in the 180s.  It subsequently self reverted to sinus rhythm.  Cardiology and electrophysiology consulted. Pt had been doing well throughout hospitalization, was transitioned to PO amiodarone . On afternoon of 5/12 he had recurrence of CP and LH - that felt similar to his chest discomfort he had immediately prior to coming to hospital. Tele showed frequent runs of NSVT. He was transitioned back to amio gtt, and since that time, he has felt well. Plan for transfer to [facility] for VT ablation  -  TOC has initiated paperwork 05/[facility] see if [facility].  After review of paperwork by the [place] we will need to do Dr. To Dr. Communication before initiating transfer.    Consultants:  Cardiology Electrophysiology   Procedures/Surgeries: none      ASSESSMENT & PLAN:   Chest pain secondary to ventricular tachycardia PVC's LifeVest tracings reviewed, highly suspicious for recurrent VT when transitioned to PO amio he had recurrence of  NSVT IV amiodarone   plan VT ablation - awaiting transfer to [facility] facility in [place] if they accept   Chronic p paroxysmal A-fib (HCC) Rate controlled on metoprolol  XL succinate 25 daily.   Anticoagulated on Eliquis    AKI (acute kidney injury) - resolved Monitor renal function Avoid nephrotoxic medications   Neuropathy Continue gabapentin     Malignant neoplasm metastatic to bone  Patient follows up with oncologist at [facility]   Hyperlipidemia Continue with atorvastatin  and aspirin     overweight based on BMI: Body mass index is 28.81 kg/m.Juan Huynh Significantly low or high BMI is associated with higher medical risk.  Underweight - under 18  overweight - 25 to 29 obese - 30 or more Class 1 obesity: BMI of 30.0 to 34 Class 2 obesity: BMI of 35.0 to 39 Class 3 obesity: BMI of 40.0 to 49 Super Morbid Obesity: BMI 50-59 Super-super Morbid Obesity: BMI 60+ Healthy nutrition and physical activity advised as adjunct to other disease management and risk reduction treatments    DVT prophylaxis: eliquis  IV fluids: no continuous IV fluids  Nutrition: cardiac Central lines / other devices: none  Code Status: FULL CODE ACP documentation reviewed:  none on file in [facility]  TOC needs: TBD Medical barriers to dispo: transfer. Expected medical readiness for [facility] once [facility] appro[facility] / may need to explore transfer to other facility.              Subjective / Brief ROS:  Patient reports no concerns at this time Denies CP/SOB.  Pain controlled.  Denies new weakness.  Tolerating diet .  Reports no concerns w/ urination/defecation.   Family Communication: none at this time     Objective Findings:  Vitals:   11/14/23 0415 11/14/23 0823 11/14/23 1046 11/14/23 1529  BP: 111/73 119/74 115/74 104/73  Pulse: (!) 54 (!) 55 (!) 58 (!) 58  Resp:  20 20 20   Temp: 98.1 F (36.7 C) 97.8 F (36.6 C) 98.1 F (36.7 C) 98.5 F (36.9 C)  TempSrc: Oral  Oral   SpO2: 97% 99% 98% 98%   Weight:      Height:        Intake/Output Summary (Last 24 hours) at 11/14/2023 1656 Last data filed at 11/14/2023 1644 Gross per 24 hour  Intake 1683.54 ml  Output --  Net 1683.54 ml   Filed Weights   11/09/23 1607 11/09/23 1945  Weight: 88.5 kg 88.5 kg    Examination:  Physical Exam Constitutional:      General: He is not in acute distress. Cardiovascular:     Rate and Rhythm: Normal rate and regular rhythm.  Pulmonary:     Effort: Pulmonary effort is normal.     Breath sounds: Normal breath sounds.  Musculoskeletal:     Right lower leg: No edema.     Left lower leg: No edema.  Skin:    General: Skin is warm and dry.  Neurological:     General: No focal deficit present.     Mental Status: He is alert and oriented to person, place, and time.  Psychiatric:        Mood and Affect: Mood normal.        Behavior: Behavior normal.          Scheduled Medications:   apixaban   5 mg Oral BID   aspirin   81 mg Oral Daily   atorvastatin   40 mg Oral Daily   gabapentin   100 mg Oral QHS   losartan   25 mg Oral Daily   metoprolol  succinate  25 mg Oral Daily   sodium chloride  flush  3 mL Intravenous Q12H    Continuous Infusions:  amiodarone  30 mg/hr (11/14/23 1644)    PRN Medications:  acetaminophen  **OR** acetaminophen , polyethylene glycol, polyvinyl alcohol  Antimicrobials from admission:  Anti-infectives (From admission, onward)    None           Data Reviewed:  I have personally reviewed the following...  CBC: Recent Labs  Lab 11/09/23 1611 11/10/23 0444 11/11/23 0353 11/12/23 0355 11/13/23 0535 11/14/23 0236  WBC 5.6 5.9 4.8 5.8 5.0 5.6  NEUTROABS 4.1  --  3.0 4.0 3.3 3.7  HGB 13.7 12.5* 12.9* 13.1 13.5 12.8*  HCT 40.8 37.2* 38.1* 38.1* 39.5 37.3*  MCV 94.2 93.7 93.4 93.2 92.7 91.4  PLT 217 190 200 201 200 208   Basic Metabolic Panel: Recent Labs  Lab 11/09/23 1801 11/10/23 0444 11/11/23 0353 11/12/23 0355 11/13/23 0535  11/14/23 0236  NA  --  140 138 137 139 138  K  --  3.9 4.3 4.0 4.0 4.1  CL  --  110 108 106 106 107  CO2  --  24 22 25 25 25   GLUCOSE  --  99 109* 106* 102* 104*  BUN  --  19 21 22 19  24*  CREATININE  --  1.02 0.98 0.95 0.84 0.91  CALCIUM   --  8.9 9.2 9.0 9.3 9.2  MG 2.0  --   --  2.0  --   --    GFR: Estimated Creatinine  Clearance: 76 mL/min (by C-G formula based on SCr of 0.91 mg/dL). Liver Function Tests: No results for input(s): "AST", "ALT", "ALKPHOS", "BILITOT", "PROT", "ALBUMIN" in the last 168 hours. No results for input(s): "LIPASE", "AMYLASE" in the last 168 hours. No results for input(s): "AMMONIA" in the last 168 hours. Coagulation Profile: Recent Labs  Lab 11/10/23 0444  INR 1.1   Cardiac Enzymes: No results for input(s): "CKTOTAL", "CKMB", "CKMBINDEX", "TROPONINI" in the last 168 hours. BNP (last 3 results) No results for input(s): "PROBNP" in the last 8760 hours. HbA1C: No results for input(s): "HGBA1C" in the last 72 hours. CBG: No results for input(s): "GLUCAP" in the last 168 hours. Lipid Profile: No results for input(s): "CHOL", "HDL", "LDLCALC", "TRIG", "CHOLHDL", "LDLDIRECT" in the last 72 hours. Thyroid  Function Tests: No results for input(s): "TSH", "T4TOTAL", "FREET4", "T3FREE", "THYROIDAB" in the last 72 hours. Anemia Panel: No results for input(s): "VITAMINB12", "FOLATE", "FERRITIN", "TIBC", "IRON", "RETICCTPCT" in the last 72 hours. Most Recent Urinalysis On File:     Component Value Date/Time   COLORURINE YELLOW 08/07/2023 1225   APPEARANCEUR CLEAR 08/07/2023 1225   LABSPEC 1.010 08/07/2023 1225   PHURINE 5.5 08/07/2023 1225   GLUCOSEU NEGATIVE 08/07/2023 1225   HGBUR NEGATIVE 08/07/2023 1225   BILIRUBINUR NEGATIVE 08/07/2023 1225   KETONESUR NEGATIVE 08/07/2023 1225   PROTEINUR NEGATIVE 08/07/2023 1225   NITRITE NEGATIVE 08/07/2023 1225   LEUKOCYTESUR NEGATIVE 08/07/2023 1225   Sepsis  Labs: @LABRCNTIP (procalcitonin:4,lacticidven:4) Microbiology: No results found for this or any previous visit (from the past 240 hours).    Radiology Studies last 3 days: MR CARDIAC MORPHOLOGY W WO CONTRAST Result Date: 11/12/2023 CLINICAL DATA:  Ventricular tachycardia EXAM: CARDIAC MRI TECHNIQUE: The patient was scanned on a 1.5 Tesla Siemens magnet. A dedicated cardiac coil was used. Functional imaging was done using Fiesta sequences. 2,3, and 4 chamber views were done to assess for RWMA's. Modified Simpson's rule using a short axis stack was used to calculate an ejection fraction on a dedicated work [employer], [employer]. The patient received 12 cc of Gadavist. After 10 minutes inversion recovery sequences were used to assess for infiltration and scar tissue. Velocity flow mapping performed in the ascending aorta and main pulmonary artery. CONTRAST:  12 cc  of Gadavist FINDINGS: 1. Normal left ventricular size, thickness and systolic function (LVEF = 51%). There are no regional wall motion abnormalities. There is no late gadolinium enhancement in the left ventricular myocardium. LVEDV: 129 ml LVESV: 63 ml SV: 67 ml CO: 3.6 L/min Myocardial mass: 68g LV native T1 value 1001 ms (normal <1000 ms) LV ECV value 31% (normal <30%) 2. Normal right ventricular size, thickness and systolic function (RVEF = 47%). There are no regional wall motion abnormalities. 3.  Mild to moderate LA dilation, mild right atrial size. 4. Normal size of the aortic root, ascending aorta and pulmonary artery. 5. Mild tricuspid regurgitation, no significant valvular abnormalities. 6.  Normal pericardium.  No pericardial effusion. IMPRESSION: 1.  Normal LV systolic function.  LVEF 51%. 2.  No LGE or scar. 3.  No evidence for infiltrative or inflammatory disease. 4.  Low normal RV systolic function. 5.  No significant valvular abnormalities. Electronically Signed   By: Constancia Delton M.D.   On: 11/12/2023 14:38   MR CARDIAC  VELOCITY FLOW MAP Result Date: 11/12/2023 CLINICAL DATA:  Ventricular tachycardia EXAM: CARDIAC MRI TECHNIQUE: The patient was scanned on a 1.5 Tesla Siemens magnet. A dedicated cardiac coil was used. Functional imaging was done  using Fiesta sequences. 2,3, and 4 chamber views were done to assess for RWMA's. Modified Simpson's rule using a short axis stack was used to calculate an ejection fraction on a dedicated work [employer] officer, trade [employer]. The patient received 12 cc of Gadavist. After 10 minutes inversion recovery sequences were used to assess for infiltration and scar tissue. Velocity flow mapping performed in the ascending aorta and main pulmonary artery. CONTRAST:  12 cc  of Gadavist FINDINGS: 1. Normal left ventricular size, thickness and systolic function (LVEF = 51%). There are no regional wall motion abnormalities. There is no late gadolinium enhancement in the left ventricular myocardium. LVEDV: 129 ml LVESV: 63 ml SV: 67 ml CO: 3.6 L/min Myocardial mass: 68g LV native T1 value 1001 ms (normal <1000 ms) LV ECV value 31% (normal <30%) 2. Normal right ventricular size, thickness and systolic function (RVEF = 47%). There are no regional wall motion abnormalities. 3.  Mild to moderate LA dilation, mild right atrial size. 4. Normal size of the aortic root, ascending aorta and pulmonary artery. 5. Mild tricuspid regurgitation, no significant valvular abnormalities. 6.  Normal pericardium.  No pericardial effusion. IMPRESSION: 1.  Normal LV systolic function.  LVEF 51%. 2.  No LGE or scar. 3.  No evidence for infiltrative or inflammatory disease. 4.  Low normal RV systolic function. 5.  No significant valvular abnormalities. Electronically Signed   By: Constancia Delton M.D.   On: 11/12/2023 14:38   MR CARDIAC VELOCITY FLOW MAP Result Date: 11/12/2023 CLINICAL DATA:  Ventricular tachycardia EXAM: CARDIAC MRI TECHNIQUE: The patient was scanned on a 1.5 Tesla Siemens magnet. A dedicated cardiac coil was  used. Functional imaging was done using Fiesta sequences. 2,3, and 4 chamber views were done to assess for RWMA's. Modified Simpson's rule using a short axis stack was used to calculate an ejection fraction on a dedicated work [employer] officer, trade [employer]. The patient received 12 cc of Gadavist. After 10 minutes inversion recovery sequences were used to assess for infiltration and scar tissue. Velocity flow mapping performed in the ascending aorta and main pulmonary artery. CONTRAST:  12 cc  of Gadavist FINDINGS: 1. Normal left ventricular size, thickness and systolic function (LVEF = 51%). There are no regional wall motion abnormalities. There is no late gadolinium enhancement in the left ventricular myocardium. LVEDV: 129 ml LVESV: 63 ml SV: 67 ml CO: 3.6 L/min Myocardial mass: 68g LV native T1 value 1001 ms (normal <1000 ms) LV ECV value 31% (normal <30%) 2. Normal right ventricular size, thickness and systolic function (RVEF = 47%). There are no regional wall motion abnormalities. 3.  Mild to moderate LA dilation, mild right atrial size. 4. Normal size of the aortic root, ascending aorta and pulmonary artery. 5. Mild tricuspid regurgitation, no significant valvular abnormalities. 6.  Normal pericardium.  No pericardial effusion. IMPRESSION: 1.  Normal LV systolic function.  LVEF 51%. 2.  No LGE or scar. 3.  No evidence for infiltrative or inflammatory disease. 4.  Low normal RV systolic function. 5.  No significant valvular abnormalities. Electronically Signed   By: Constancia Delton M.D.   On: 11/12/2023 14:38         Kolten Ryback, DO [facility] 11/14/2023, 4:56 PM    Dictation software may have been used to generate the above note. Typos may occur and escape review in typed/dictated notes. Please contact Dr Authur Leghorn directly for clarity if needed.  Staff may message me via secure chat in Epic  but this may  not receive an immediate response,  please page me for urgent  matters!  If 7PM-7AM, please contact night coverage [email or web address]

## 2023-11-14 NOTE — Plan of Care (Signed)

## 2023-11-14 NOTE — Progress Notes (Signed)
  Patient Name: Juan Huynh. Date of Encounter: 11/14/2023  Primary Cardiologist: None Electrophysiologist: None  Interval Summary   NAEON. He requests transfer to [facility] for invasive EP procedure. Hospitalist has started that process.   Currently he feels well without complaints. He walked around nursing unit yesterday. He denies chest pain, shortness of breath, or any new concerns.  Vital Signs    Vitals:   11/13/23 2343 11/14/23 0415 11/14/23 0823 11/14/23 1046  BP: 113/75 111/73 119/74 115/74  Pulse: (!) 56 (!) 54 (!) 55 (!) 58  Resp:   20 20  Temp: 98.3 F (36.8 C) 98.1 F (36.7 C) 97.8 F (36.6 C) 98.1 F (36.7 C)  TempSrc: Oral Oral  Oral  SpO2: 96% 97% 99% 98%  Weight:      Height:        Intake/Output Summary (Last 24 hours) at 11/14/2023 1146 Last data filed at 11/14/2023 1046 Gross per 24 hour  Intake 1521.1 ml  Output --  Net 1521.1 ml   Filed Weights   11/09/23 1607 11/09/23 1945  Weight: 88.5 kg 88.5 kg    Physical Exam    GEN- The patient is well appearing, alert and oriented x 3 today.   Lungs- Clear to ausculation bilaterally, normal work of breathing Cardiac- Regular rate and rhythm, no murmurs, rubs or gallops GI- soft, NT, ND, + BS Extremities- no clubbing or cyanosis. No edema  Telemetry    SR with occasional PVC, sometimes trigeminal Rates 50-60s (personally reviewed)  Hospital Course    Juan Huynh. is a 78 y.o. male with PMH of VT, atrial flutter, HFmrEF, PACs/PVCs, metastatic prostate Ca, HTN admitted for recurrent VT  Cardiac MRI with normal LVEF, no scar   Assessment & Plan    #) idiopathic VT #) PVCs VT quiescent on IV amiodarone  at 30mg /hr, continue Tolerating increased BB made yesterday, continue 25mg  toprol  daily Awaiting feedback from [place] regarding transfer for EPS +/- VT ablation        For questions or updates, please contact [facility] Please consult [email or web address] for contact info under  Cardiology/STEMI.  Signed, Adaline Holly, NP  11/14/2023, 11:46 AM

## 2023-11-15 DIAGNOSIS — I472 Ventricular tachycardia, unspecified: Secondary | ICD-10-CM | POA: Diagnosis not present

## 2023-11-15 LAB — CBC
HCT: 37.4 % — ABNORMAL LOW (ref 39.0–52.0)
Hemoglobin: 12.9 g/dL — ABNORMAL LOW (ref 13.0–17.0)
MCH: 31.7 pg (ref 26.0–34.0)
MCHC: 34.5 g/dL (ref 30.0–36.0)
MCV: 91.9 fL (ref 80.0–100.0)
Platelets: 204 10*3/uL (ref 150–400)
RBC: 4.07 MIL/uL — ABNORMAL LOW (ref 4.22–5.81)
RDW: 12.7 % (ref 11.5–15.5)
WBC: 6.4 10*3/uL (ref 4.0–10.5)
nRBC: 0 % (ref 0.0–0.2)

## 2023-11-15 LAB — BASIC METABOLIC PANEL WITH GFR
Anion gap: 9 (ref 5–15)
BUN: 20 mg/dL (ref 8–23)
CO2: 23 mmol/L (ref 22–32)
Calcium: 9.2 mg/dL (ref 8.9–10.3)
Chloride: 106 mmol/L (ref 98–111)
Creatinine, Ser: 0.91 mg/dL (ref 0.61–1.24)
GFR, Estimated: >60 mL/min (ref >60)
Glucose, Bld: 104 mg/dL — ABNORMAL HIGH (ref 70–99)
Potassium: 4 mmol/L (ref 3.5–5.1)
Sodium: 138 mmol/L (ref 135–145)

## 2023-11-15 LAB — SARS CORONAVIRUS 2 BY RT PCR: SARS Coronavirus 2 by RT PCR: NEGATIVE

## 2023-11-15 MED ORDER — AMIODARONE HCL IN DEXTROSE 360-4.14 MG/200ML-% IV SOLN: 30.0000 mg/h | INTRAVENOUS | Status: AC

## 2023-11-15 NOTE — Plan of Care (Signed)

## 2023-11-15 NOTE — Progress Notes (Signed)
   11/15/23 0740  Vitals  Temp 98.6 F (37 C)  BP 118/69  MAP (mmHg) 85  BP Location Right Arm  BP Method Automatic  Patient Position (if appropriate) Sitting  Pulse Rate (!) 57  Pulse Rate Source Monitor

## 2023-11-15 NOTE — TOC Progression Note (Signed)
 Transition of Care (TOC) - Progression Note    Patient Details  Name: Juan Huynh. MRN: [id number] Date of Birth: [birth year]  Transition of Care [facility]) CM/SW Contact  Odilia Bennett, LCSW Phone Number: 11/15/2023, 9:16 AM  Clinical Narrative:   [facility] would not go through fax due to busy signal. CSW sent to transfer coordinator in a secure email.  Expected Discharge Plan and Services         Expected Discharge Date: 11/12/23                                     Social Determinants of Health (SDOH) Interventions SDOH Screenings   Food Insecurity: No Food Insecurity (11/10/2023)  Housing: Low Risk  (11/10/2023)  Transportation Needs: No Transportation Needs (11/10/2023)  Utilities: Not At Risk (11/10/2023)  Social Connections: Moderately Isolated (11/10/2023)  Tobacco Use: Medium Risk (11/09/2023)    Readmission Risk Interventions     No data to display

## 2023-11-15 NOTE — Discharge Summary (Signed)
 Physician Discharge Summary   Patient: Juan Huynh. MRN: [id number]  DOB: 11-Nov-1945   Admit:     Date of Admission: 11/09/2023 Admitted from: home   Discharge: Date of discharge: 11/15/23 Disposition: transfer to [facility] medical facility  Condition at discharge: stable  CODE STATUS: FULL CODE     Discharge Physician: Melodi Sprung, DO [facility]     PCP: [facility]  Recommendations for Outpatient Follow-up:  Follow up pending clinical course at [facility]        Discharge Diagnoses: Principal Problem:   Chest pain Active Problems:   AKI (acute kidney injury) (HCC)   A-fib (HCC)   Neuropathy   Hypokalemia   Dilated cardiomyopathy (HCC)   Sustained ventricular tachycardia [facility])     Hospital course / significant events:   HPI: Juan Huynh. is a 78 y.o. male with medical history significant of Wide-complex tachycardia that was initially diagnosed on or around October 31, 2023.  At that time patient had received amiodarone  and also diagnosed with new onset atrial flutter.  Patient was started on beta-blockers and losartan .  Patient was discharged with external automatic cardio defibrillator device EF was 50 to 55%.   Patient was in his usual state of health till around 4 PM on 11/09/23 when patient had new onset of nonradiating anterior chest pain aching, no aggravating or relieving factor, present at rest lasted about 10 to 12 minutes. LifeVest alerted him that it had "taken action ".  EMS did EKG and diagnosed with wide-complex tachycardia with heart rate in the 180s.  It subsequently self reverted to sinus rhythm.  Cardiology and electrophysiology consulted. Pt had been doing well throughout hospitalization, was transitioned to PO amiodarone . On afternoon of 5/12 he had recurrence of CP and LH - that felt similar to his chest discomfort he had immediately prior to coming to hospital. Tele showed frequent runs of NSVT. He was transitioned  back to amio gtt, and since that time, he has felt well. Plan for transfer to [facility] for VT ablation    Consultants:  Cardiology Electrophysiology   Procedures/Surgeries: none      ASSESSMENT & PLAN:   Chest pain secondary to ventricular tachycardia PVC's LifeVest tracings reviewed, highly suspicious for recurrent VT when transitioned to PO amio he had recurrence of NSVT IV amiodarone  drip plan VT [facility]    Chronic p paroxysmal A-fib (HCC) Rate controlled on metoprolol  XL succinate 25 daily.   Anticoagulated on Eliquis   AKI (acute kidney injury) - resolved Monitor renal function Avoid nephrotoxic medications   Neuropathy Continue gabapentin     Malignant neoplasm metastatic to bone  Patient follows up with oncologist at [facility]   Hyperlipidemia Continue with atorvastatin  and aspirin     overweight based on BMI: Body mass index is 28.81 kg/m.Aaron Aas Significantly low or high BMI is associated with higher medical risk.  Underweight - under 18  overweight - 25 to 29 obese - 30 or more Class 1 obesity: BMI of 30.0 to 34 Class 2 obesity: BMI of 35.0 to 39 Class 3 obesity: BMI of 40.0 to 49 Super Morbid Obesity: BMI 50-59 Super-super Morbid Obesity: BMI 60+ Healthy nutrition and physical activity advised as adjunct to other disease management and risk reduction treatments    DVT prophylaxis: eliquis  IV fluids: no continuous IV fluids  Nutrition: cardiac Central lines / other devices: none  Code Status: FULL CODE ACP documentation reviewed:  none on file  in [facility]              Discharge Instructions  Allergies as of 11/15/2023       Reactions   Budesonide Nausea Only   Fluticasone Other (See Comments)   Nose bleed        Medication List     STOP taking these medications    amiodarone  200 MG tablet Commonly known as: PACERONE        TAKE these medications    acetaminophen  650 MG CR tablet Commonly known as:  TYLENOL  Take 1,300 mg by mouth every 8 (eight) hours as needed for pain.   amiodarone  360-4.14 MG/200ML-% Soln Commonly known as: NEXTERONE  PREMIX Inject 30 mg/hr into the vein continuous.   apixaban  5 MG Tabs tablet Commonly known as: ELIQUIS  Take 1 tablet (5 mg total) by mouth 2 (two) times daily.   aspirin  81 MG chewable tablet Chew by mouth.   atorvastatin  40 MG tablet Commonly known as: LIPITOR Take by mouth.   calcium  carbonate 750 MG chewable tablet Commonly known as: TUMS EX Chew 1 tablet by mouth 2 (two) times daily as needed for heartburn.   cetirizine 10 MG tablet Commonly known as: ZYRTEC TAKE ONE TABLET BY MOUTH  PRN   gabapentin  100 MG capsule Commonly known as: NEURONTIN  Take 100 mg by mouth at bedtime.   losartan  25 MG tablet Commonly known as: COZAAR  Take 25 mg by mouth daily.   metoprolol  succinate 25 MG 24 hr tablet Commonly known as: TOPROL -XL Take 25 mg by mouth daily.   multivitamin tablet Take 1 tablet by mouth daily.   polyethylene glycol 17 g packet Commonly known as: MIRALAX  / GLYCOLAX  Take 17 g by mouth daily as needed for mild constipation.   Refresh Optive Mega-3 0.5-1-0.5 % Soln Generic drug: Carboxymeth-Glyc-Polysorb PF Apply 2-3 drops to eye 2 (two) times daily as needed.          Allergies  Allergen Reactions   Budesonide Nausea Only   Fluticasone Other (See Comments)    Nose bleed     Subjective: Pt feeling well this morning, no CP/palpitations, no SOB.    Discharge Exam: BP 117/70 (BP Location: Right Arm)   Pulse (!) 56   Temp 98.1 F (36.7 C)   Resp 16   Ht 5\' 9"  (1.753 m)   Wt 88.5 kg   SpO2 100%   BMI 28.81 kg/m  General: Pt is alert, awake, not in acute distress Cardiovascular: RRR, S1/S2 +, no rubs, no gallops Respiratory: CTA bilaterally, no wheezing, no rhonchi Abdominal: Soft, NT, ND, bowel sounds + Extremities: no edema, no cyanosis     The results of significant diagnostics from this  hospitalization (including imaging, microbiology, ancillary and laboratory) are listed below for reference.     Microbiology: No results found for this or any previous visit (from the past 240 hours).   Labs: BNP (last 3 results) No results for input(s): "BNP" in the last 8760 hours. Basic Metabolic Panel: Recent Labs  Lab 11/09/23 1801 11/10/23 0444 11/11/23 0353 11/12/23 0355 11/13/23 0535 11/14/23 0236 11/15/23 0422  NA  --    < > 138 137 139 138 138  K  --    < > 4.3 4.0 4.0 4.1 4.0  CL  --    < > 108 106 106 107 106  CO2  --    < > 22 25 25 25 23   GLUCOSE  --    < > 109* 106* 102*  104* 104*  BUN  --    < > 21 22 19  24* 20  CREATININE  --    < > 0.98 0.95 0.84 0.91 0.91  CALCIUM   --    < > 9.2 9.0 9.3 9.2 9.2  MG 2.0  --   --  2.0  --   --   --    < > = values in this interval not displayed.   Liver Function Tests: No results for input(s): "AST", "ALT", "ALKPHOS", "BILITOT", "PROT", "ALBUMIN" in the last 168 hours. No results for input(s): "LIPASE", "AMYLASE" in the last 168 hours. No results for input(s): "AMMONIA" in the last 168 hours. CBC: Recent Labs  Lab 11/09/23 1611 11/10/23 0444 11/11/23 0353 11/12/23 0355 11/13/23 0535 11/14/23 0236 11/15/23 0422  WBC 5.6   < > 4.8 5.8 5.0 5.6 6.4  NEUTROABS 4.1  --  3.0 4.0 3.3 3.7  --   HGB 13.7   < > 12.9* 13.1 13.5 12.8* 12.9*  HCT 40.8   < > 38.1* 38.1* 39.5 37.3* 37.4*  MCV 94.2   < > 93.4 93.2 92.7 91.4 91.9  PLT 217   < > 200 201 200 208 204   < > = values in this interval not displayed.   Cardiac Enzymes: No results for input(s): "CKTOTAL", "CKMB", "CKMBINDEX", "TROPONINI" in the last 168 hours. BNP: Invalid input(s): "POCBNP" CBG: No results for input(s): "GLUCAP" in the last 168 hours. D-Dimer No results for input(s): "DDIMER" in the last 72 hours. Hgb A1c No results for input(s): "HGBA1C" in the last 72 hours. Lipid Profile No results for input(s): "CHOL", "HDL", "LDLCALC", "TRIG", "CHOLHDL",  "LDLDIRECT" in the last 72 hours. Thyroid  function studies No results for input(s): "TSH", "T4TOTAL", "T3FREE", "THYROIDAB" in the last 72 hours.  Invalid input(s): "FREET3" Anemia work up No results for input(s): "VITAMINB12", "FOLATE", "FERRITIN", "TIBC", "IRON", "RETICCTPCT" in the last 72 hours. Urinalysis    Component Value Date/Time   COLORURINE YELLOW 08/07/2023 1225   APPEARANCEUR CLEAR 08/07/2023 1225   LABSPEC 1.010 08/07/2023 1225   PHURINE 5.5 08/07/2023 1225   GLUCOSEU NEGATIVE 08/07/2023 1225   HGBUR NEGATIVE 08/07/2023 1225   BILIRUBINUR NEGATIVE 08/07/2023 1225   KETONESUR NEGATIVE 08/07/2023 1225   PROTEINUR NEGATIVE 08/07/2023 1225   NITRITE NEGATIVE 08/07/2023 1225   LEUKOCYTESUR NEGATIVE 08/07/2023 1225   Sepsis Labs Recent Labs  Lab 11/12/23 0355 11/13/23 0535 11/14/23 0236 11/15/23 0422  WBC 5.8 5.0 5.6 6.4   Microbiology No results found for this or any previous visit (from the past 240 hours). Imaging DG Chest Portable 1 View Result Date: 11/09/2023 CLINICAL DATA:  Chest pain. EXAM: PORTABLE CHEST 1 VIEW COMPARISON:  October 31, 2023. FINDINGS: Stable cardiomediastinal silhouette. Both lungs are clear. The visualized skeletal structures are unremarkable. IMPRESSION: No active disease. Electronically Signed   By: Rosalene Colon M.D.   On: 11/09/2023 18:01      Time coordinating discharge: over 30 minutes  SIGNED:  Elissia Spiewak DO [facility]

## 2023-11-17 LAB — SARS CORONAVIRUS 2 (TAT 6-24 HRS): SARS Coronavirus 2: NEGATIVE

## 2023-12-05 ENCOUNTER — Encounter: Admitting: Student

## 2024-01-14 ENCOUNTER — Ambulatory Visit: Admit: 2024-01-14 | Discharge: 2024-01-15 | Payer: Medicare (Managed Care)

## 2024-01-14 ENCOUNTER — Encounter
Admit: 2024-01-14 | Discharge: 2024-01-15 | Payer: Medicare (Managed Care) | Attending: Hematology & Oncology | Primary: Hematology & Oncology

## 2024-02-12 ENCOUNTER — Other Ambulatory Visit: Admit: 2024-02-12 | Discharge: 2024-02-13 | Payer: Medicare (Managed Care)

## 2024-02-12 ENCOUNTER — Ambulatory Visit
Admit: 2024-02-12 | Discharge: 2024-02-13 | Payer: Medicare (Managed Care) | Attending: Hematology & Oncology | Primary: Hematology & Oncology

## 2024-02-12 DIAGNOSIS — C61 Malignant neoplasm of prostate: Principal | ICD-10-CM

## 2024-03-17 ENCOUNTER — Ambulatory Visit: Admit: 2024-03-17 | Discharge: 2024-03-18 | Payer: Medicare (Managed Care)

## 2024-05-12 ENCOUNTER — Ambulatory Visit: Admit: 2024-05-12 | Discharge: 2024-05-13 | Payer: Medicare (Managed Care)

## 2024-05-13 ENCOUNTER — Ambulatory Visit
Admit: 2024-05-13 | Discharge: 2024-05-14 | Payer: Medicare (Managed Care) | Attending: Hematology & Oncology | Primary: Hematology & Oncology

## 2024-05-13 DIAGNOSIS — C61 Malignant neoplasm of prostate: Principal | ICD-10-CM

## 2024-07-31 ENCOUNTER — Ambulatory Visit: Admit: 2024-07-31 | Discharge: 2024-08-01 | Payer: Medicare (Managed Care)

## 2024-08-06 ENCOUNTER — Ambulatory Visit
Admission: EM | Admit: 2024-08-06 | Discharge: 2024-08-06 | Disposition: A | Discharge status: Home / Self Care | Source: Home / Self Care | Attending: Family Medicine | Admitting: Family Medicine

## 2024-08-06 ENCOUNTER — Ambulatory Visit

## 2024-08-06 DIAGNOSIS — Z659 Problem related to unspecified psychosocial circumstances: Secondary | ICD-10-CM | POA: Insufficient documentation

## 2024-08-06 DIAGNOSIS — I5022 Chronic systolic (congestive) heart failure: Secondary | ICD-10-CM | POA: Insufficient documentation

## 2024-08-06 DIAGNOSIS — M79652 Pain in left thigh: Secondary | ICD-10-CM | POA: Insufficient documentation

## 2024-08-06 DIAGNOSIS — R2681 Unsteadiness on feet: Secondary | ICD-10-CM | POA: Insufficient documentation

## 2024-08-06 DIAGNOSIS — M8088XS Other osteoporosis with current pathological fracture, vertebra(e), sequela: Secondary | ICD-10-CM | POA: Insufficient documentation

## 2024-08-06 DIAGNOSIS — W009XXA Unspecified fall due to ice and snow, initial encounter: Secondary | ICD-10-CM

## 2024-08-06 DIAGNOSIS — R9439 Abnormal result of other cardiovascular function study: Secondary | ICD-10-CM | POA: Insufficient documentation

## 2024-08-06 DIAGNOSIS — M810 Age-related osteoporosis without current pathological fracture: Secondary | ICD-10-CM | POA: Insufficient documentation

## 2024-08-06 DIAGNOSIS — Z7181 Spiritual or religious counseling: Secondary | ICD-10-CM | POA: Insufficient documentation

## 2024-08-06 DIAGNOSIS — I493 Ventricular premature depolarization: Secondary | ICD-10-CM | POA: Insufficient documentation

## 2024-08-06 DIAGNOSIS — I491 Atrial premature depolarization: Secondary | ICD-10-CM | POA: Insufficient documentation

## 2024-08-06 DIAGNOSIS — R0789 Other chest pain: Secondary | ICD-10-CM

## 2024-08-06 DIAGNOSIS — R310 Gross hematuria: Secondary | ICD-10-CM | POA: Insufficient documentation

## 2024-08-06 DIAGNOSIS — R319 Hematuria, unspecified: Secondary | ICD-10-CM | POA: Insufficient documentation

## 2024-08-06 DIAGNOSIS — Z95810 Presence of automatic (implantable) cardiac defibrillator: Secondary | ICD-10-CM | POA: Insufficient documentation

## 2024-08-06 DIAGNOSIS — R42 Dizziness and giddiness: Secondary | ICD-10-CM | POA: Insufficient documentation

## 2024-08-06 MED ORDER — PREDNISONE 10 MG (21) PO TBPK: ORAL_TABLET | Freq: Every day | ORAL | 0 refills | Status: AC

## 2024-08-06 MED ORDER — METHOCARBAMOL 500 MG PO TABS: 500.0000 mg | ORAL_TABLET | Freq: Two times a day (BID) | ORAL | 0 refills | Status: AC

## 2024-08-06 NOTE — Discharge Instructions (Addendum)
 Your xray did not show any broken or dislocated ribs.  You can see your results in MyChart.   If medication was prescribed, stop by the pharmacy to pick up your prescriptions.  For your  pain, Take 1500 mg Tylenol  twice a day (or 1000 mg 3 times a day), take muscle relaxer (methocarbamol /Robaxin ) twice a day,  as needed for pain.  Take prednisone  as prescribed.  Apply warm compresses intermittently, as needed.  As pain recedes, begin normal activities slowly as tolerated.  Follow up with primary care provider or an orthopedic provider, if symptoms persist.  Watch for worsening symptoms such as an increasing weakness or loss of sensation, increasing pain and/or the loss of bladder or bowel function. Should any of these occur, go to the emergency department immediately.

## 2024-08-06 NOTE — ED Triage Notes (Signed)
 Pt c/o lower back & R sided flank pain d/t fall 8 days ago. States slipped on ice while coming down his steps at home. Has tried OTC meds w/o relief. Denies any head injury.

## 2024-08-06 NOTE — ED Provider Notes (Signed)
 " [facility]    CSN: [id number] Arrival date & time: 08/06/24  1028      History   Chief Complaint Chief Complaint  Patient presents with   Fall    HPI  HPI Juan Huynh. is a 79 y.o. male.   Darious presents after slipping on the ice on the steps of his home 10 days ago.  Has right mid back pain since the fall. Treating pain with Tylenol  and heat without relief.    Pain is described as dull but can be sharp and does radiate around to his right flank. Pain rated 7/10 with certain movements.       Past Medical History:  Diagnosis Date   Arthritis    Cancer (HCC)    Prostate Ca.  Metastisized to left scapula and pelvis.   Chest pain    a. 07/2023 Cath [facility]): reportedly tortuous vessles, no significant CAD.   Frequent PVCs    a. 12/2022 Event Monitor: 3.6% PAC, 3.6% PVC burden.   GERD (gastroesophageal reflux disease)    Heart failure with mid-range ejection fraction (HCC)    a. 12/2018 Echo: EF 50-55%; b. 01/2019 MV: No isch/infarct; c. 2024 Echo: EF 45%; d. 07/2023 Cath: no significant CAD. Tortuous vessels; e. 11/2023 Echo: EF 50-55%, mildly reduced RV fxn. RVSP 38.33mmHg. Mild-mod TR.    Patient Active Problem List   Diagnosis Date Noted   Abnormal result of other cardiovascular function study 08/06/2024   Age-related osteoporosis without current pathological fracture 08/06/2024   Atrial premature depolarization 08/06/2024   Chronic systolic heart failure (HCC) 08/06/2024   Gross hematuria 08/06/2024   Hematuria, unspecified 08/06/2024   Other osteoporosis with current pathological fracture, vertebra(e), sequela 08/06/2024   Pain in left thigh 08/06/2024   Presence of automatic (implantable) cardiac defibrillator 08/06/2024   Problem related to unspecified psychosocial circumstances 08/06/2024   Spiritual or religious counseling 08/06/2024   Unsteadiness on feet 08/06/2024   Ventricular premature depolarization 08/06/2024   Vertigo 08/06/2024    Sustained ventricular tachycardia (HCC) 11/12/2023   Hypokalemia 11/10/2023   Dilated cardiomyopathy (HCC) 11/10/2023   AKI (acute kidney injury) 11/09/2023   Chest pain, unspecified 11/09/2023   A-fib (HCC) 11/09/2023   Neuropathy 11/09/2023   Atrial flutter (HCC) 11/01/2023   Hypertension 11/01/2023   Wide-complex tachycardia 10/31/2023   Rectal bleeding 07/25/2022   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   Hyperlipidemia 07/20/2022   Gastroesophageal reflux disease 07/20/2022   H/O: osteoarthritis 07/20/2022   Pain, unspecified 07/20/2022   Malignant neoplasm of prostate (HCC) 07/20/2022   Allergic rhinitis 07/20/2022   Malignant neoplasm metastatic to bone (HCC) 06/16/2021   Family history of osteoporosis in father 06/01/2021   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) 05/30/2021   Tobacco use 04/15/2021   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   Abnormal TSH 03/18/2020   Arthritis of carpometacarpal (CMC) joints of both thumbs 03/18/2020   At increased risk for cardiovascular disease 12/16/2018   History of supraventricular tachycardia 12/12/2018   Non-sustained ventricular tachycardia (HCC) 12/12/2018   Pre-syncope 12/12/2018   Paroxysmal tachycardia (HCC) 12/12/2018   Hx of adenomatous colonic polyps 04/03/2018   History of malignant melanoma 03/13/2017   History of elevated prostate specific antigen (PSA) 02/19/2014    Past Surgical History:  Procedure Laterality Date   COLONOSCOPY WITH PROPOFOL  N/A 07/25/2022   Procedure: COLONOSCOPY WITH PROPOFOL ;  Surgeon: Unk Corinn Skiff, MD;  [facility];  Service:  Endoscopy;  Laterality: N/A;       Home Medications    Prior to Admission medications  Medication Sig Start Date End Date Taking? Authorizing Provider  levothyroxine (SYNTHROID) 75 MCG tablet Take 75 mcg by mouth daily before breakfast.   Yes [provider]  methocarbamol  (ROBAXIN ) 500 MG  tablet Take 1 tablet (500 mg total) by mouth 2 (two) times daily. 08/06/24  Yes Delrico Minehart, DO  predniSONE  (STERAPRED UNI-PAK 21 TAB) 10 MG (21) TBPK tablet Take by mouth daily. Take 6 tabs by mouth daily for 1, then 5 tabs for 1 day, then 4 tabs for 1 day, then 3 tabs for 1 day, then 2 tabs for 1 day, then 1 tab for 1 day. 08/06/24  Yes Jerzi Tigert, DO  acetaminophen  (TYLENOL ) 650 MG CR tablet Take 1,300 mg by mouth every 8 (eight) hours as needed for pain. Patient not taking: Reported on 11/09/2023    [provider]  amiodarone  (NEXTERONE  PREMIX) 360-4.14 MG/200ML-% SOLN Inject 30 mg/hr into the vein continuous. 11/15/23   Alexander, Natalie, DO  apixaban  (ELIQUIS ) 5 MG TABS tablet Take 1 tablet (5 mg total) by mouth 2 (two) times daily. 11/02/23   Lesia Ozell Barter, PA-C  aspirin  81 MG chewable tablet Chew by mouth. 12/25/22   [provider]  atorvastatin  (LIPITOR) 40 MG tablet Take by mouth. 03/19/23   [provider]  calcium  carbonate (TUMS EX) 750 MG chewable tablet Chew 1 tablet by mouth 2 (two) times daily as needed for heartburn. Patient not taking: Reported on 11/09/2023 10/02/21   [provider]  Carboxymeth-Glyc-Polysorb PF (REFRESH OPTIVE MEGA-3) 0.5-1-0.5 % SOLN Apply 2-3 drops to eye 2 (two) times daily as needed.    [provider]  cetirizine (ZYRTEC) 10 MG tablet TAKE ONE TABLET BY MOUTH  PRN 04/29/21   [provider]  gabapentin  (NEURONTIN ) 100 MG capsule Take 100 mg by mouth at bedtime. 04/25/23   [provider]  losartan  (COZAAR ) 25 MG tablet Take 25 mg by mouth daily. 12/25/22   [provider]  metoprolol  succinate (TOPROL -XL) 25 MG 24 hr tablet Take 25 mg by mouth daily. 07/13/23   [provider]  Multiple Vitamin (MULTIVITAMIN) tablet Take 1 tablet by mouth daily.    [provider]  polyethylene glycol (MIRALAX  / GLYCOLAX ) 17 g packet Take 17 g by mouth daily as needed for mild  constipation. 11/12/23   Dorinda Drue DASEN, MD    Family History Family History  Problem Relation Age of Onset   Heart failure Father    Colon cancer Father     Social History Social History[1]   Allergies   Budesonide and Fluticasone   Review of Systems Review of Systems: egative unless otherwise stated in HPI.      Physical Exam Triage Vital Signs ED Triage Vitals  Encounter Vitals Group     BP 08/06/24 1106 138/78     Girls Systolic BP Percentile --      Girls Diastolic BP Percentile --      Boys Systolic BP Percentile --      Boys Diastolic BP Percentile --      Pulse Rate 08/06/24 1106 64     Resp 08/06/24 1106 16     Temp 08/06/24 1106 98.4 F (36.9 C)     Temp Source 08/06/24 1106 Oral     SpO2 08/06/24 1106 96 %     Weight 08/06/24 1105 195 lb (88.5 kg)  Height --      Head Circumference --      Peak Flow --      Pain Score 08/06/24 1117 3     Pain Loc --      Pain Education --      Exclude from Growth Chart --    No data found.  Updated Vital Signs BP 138/78 (BP Location: Left Arm)   Pulse 64   Temp 98.4 F (36.9 C) (Oral)   Resp 16   Wt 88.5 kg   SpO2 96%   BMI 28.80 kg/m   Visual Acuity Right Eye Distance:   Left Eye Distance:   Bilateral Distance:    Right Eye Near:   Left Eye Near:    Bilateral Near:     Physical Exam GEN: well appearing male in no acute distress  CVS: well perfused, regular rate and rhythm RESP: speaking in full sentences without pause, no respiratory distress, clear bilaterally  CHEST: No overlying skin changes, no deformity MSK:  Thoracic and Lumbar Spine: - Inspection: no gross deformity or asymmetry, swelling or ecchymosis. No skin changes  - Palpation: No TTP over the spinous processes, bilateral paraspinal muscle tenderness and hypertonicity,  - ROM: good active ROM of the lumbar spine in flexion and extension, good thoracic rotation  ROM - Strength: 5/5 strength of lower extremity in L4-S1 nerve root  distributions b/l - Neuro: sensation intact  - Special testing: Negative straight leg raise SKIN: warm, dry, no overly skin rash or erythema    UC Treatments / Results  Labs (all labs ordered are listed, but only abnormal results are displayed) Labs Reviewed - No data to display  EKG   Radiology DG Ribs Unilateral W/Chest Right Result Date: 08/06/2024 CLINICAL DATA:  Right rib pain after fall EXAM: RIGHT RIBS AND CHEST - 3+ VIEW COMPARISON:  Nov 09, 2023 FINDINGS: No fracture or other bone lesions are seen involving the ribs. There is no evidence of pneumothorax or pleural effusion. Both lungs are clear. Heart size and mediastinal contours are within normal limits. Left-sided defibrillator is noted. IMPRESSION: Negative. Electronically Signed   By: Lynwood Landy Raddle M.D.   On: 08/06/2024 12:08     Procedures Procedures (including critical care time)  Medications Ordered in UC Medications - No data to display  Initial Impression / Assessment and Plan / UC Course  I have reviewed the triage vital signs and the nursing notes.  Pertinent labs & imaging results that were available during my care of the patient were reviewed by me and considered in my medical decision making (see chart for details).      Pt is a 79 y.o.  male after a fall on the ice 10 days for right flank and mid back pain.    Obtained chest and right rib series plain films.  Xray personally interpreted by me were unremarkable for fracture, or significant malalignment.  Radiologist report reviewed and notes left sided defibrillator.   Patient to gradually return to normal activities, as tolerated and continue ordinary activities within the limits permitted by pain. Prescribed prednisone  and muscle relaxer  for pain relief.  Advised patient to avoid  NSAIDs while taking prednisone . Tylenol  and Lidocaine  patches PRN for multimodal pain relief. Counseled patient on red flag symptoms and when to seek immediate care.  No red  flags suggesting cauda equina syndrome or progressive major motor weakness. Patient to follow up with orthopedic provider if symptoms do not improve with conservative treatment.  Return and ED precautions given.    Discussed MDM, treatment plan and plan for follow-up with patient who agrees with plan.   Final Clinical Impressions(s) / UC Diagnoses   Final diagnoses:  Fall due to slipping on ice or snow, initial encounter  Rib pain on right side     Discharge Instructions      Your xray did not show any broken or dislocated ribs.  You can see your results in MyChart.   If medication was prescribed, stop by the pharmacy to pick up your prescriptions.  For your  pain, Take 1500 mg Tylenol  twice a day (or 1000 mg 3 times a day), take muscle relaxer (methocarbamol /Robaxin ) twice a day,  as needed for pain.  Take prednisone  as prescribed.  Apply warm compresses intermittently, as needed.  As pain recedes, begin normal activities slowly as tolerated.  Follow up with primary care provider or an orthopedic provider, if symptoms persist.  Watch for worsening symptoms such as an increasing weakness or loss of sensation, increasing pain and/or the loss of bladder or bowel function. Should any of these occur, go to the emergency department immediately.        ED Prescriptions     Medication Sig Dispense Auth. Provider   methocarbamol  (ROBAXIN ) 500 MG tablet Take 1 tablet (500 mg total) by mouth 2 (two) times daily. 20 tablet Kirstina Leinweber, DO   predniSONE  (STERAPRED UNI-PAK 21 TAB) 10 MG (21) TBPK tablet Take by mouth daily. Take 6 tabs by mouth daily for 1, then 5 tabs for 1 day, then 4 tabs for 1 day, then 3 tabs for 1 day, then 2 tabs for 1 day, then 1 tab for 1 day. 21 tablet Mabry Tift, DO      PDMP not reviewed this encounter.     [1]  Social History Tobacco Use   Smoking status: Former    Types: Pipe, Cigars    Quit date: 05/2021    Years since quitting: 3.2   Smokeless  tobacco: Never   Tobacco comments:    Quit cigarette in 1992, 2 pipe bowls per day x 10 years; still may have a cigar less than once a month   Vaping Use   Vaping status: Never Used  Substance Use Topics   Alcohol  use: Not Currently    Alcohol /week: 9.0 standard drinks of alcohol     Types: 9 Standard drinks or equivalent per week    Comment: None since 11/22   Drug use: Never     Kriste Berth, DO 08/08/24 1914  "
